# Patient Record
Sex: Male | Born: 1980
Health system: Southern US, Community
[De-identification: ages and names within clinical notes are randomized; demographics above are authoritative.]

## PROBLEM LIST (undated history)

## (undated) DIAGNOSIS — I639 Cerebral infarction, unspecified: Secondary | ICD-10-CM

## (undated) DIAGNOSIS — K219 Gastro-esophageal reflux disease without esophagitis: Secondary | ICD-10-CM

## (undated) DIAGNOSIS — I10 Essential (primary) hypertension: Secondary | ICD-10-CM

## (undated) DIAGNOSIS — F32A Depression, unspecified: Secondary | ICD-10-CM

## (undated) DIAGNOSIS — F1010 Alcohol abuse, uncomplicated: Secondary | ICD-10-CM

## (undated) HISTORY — DX: Depression, unspecified: F32.A

## (undated) HISTORY — DX: Gastro-esophageal reflux disease without esophagitis: K21.9

## (undated) HISTORY — DX: Cerebral infarction, unspecified: I63.9

## (undated) HISTORY — PX: HAND TENDON SURGERY: SHX663

## (undated) HISTORY — PX: WRIST SURGERY: SHX841

---

## 2001-08-21 ENCOUNTER — Emergency Department (HOSPITAL_COMMUNITY): Admission: EM | Admit: 2001-08-21 | Discharge: 2001-08-21 | Payer: Self-pay | Admitting: Emergency Medicine

## 2001-08-25 ENCOUNTER — Emergency Department (HOSPITAL_COMMUNITY): Admission: EM | Admit: 2001-08-25 | Discharge: 2001-08-25 | Payer: Self-pay | Admitting: *Deleted

## 2001-09-03 ENCOUNTER — Emergency Department (HOSPITAL_COMMUNITY): Admission: EM | Admit: 2001-09-03 | Discharge: 2001-09-03 | Payer: Self-pay

## 2001-09-07 ENCOUNTER — Emergency Department (HOSPITAL_COMMUNITY): Admission: EM | Admit: 2001-09-07 | Discharge: 2001-09-07 | Payer: Self-pay | Admitting: Emergency Medicine

## 2002-11-02 ENCOUNTER — Emergency Department (HOSPITAL_COMMUNITY): Admission: EM | Admit: 2002-11-02 | Discharge: 2002-11-02 | Payer: Self-pay | Admitting: Emergency Medicine

## 2008-07-03 ENCOUNTER — Emergency Department (HOSPITAL_COMMUNITY): Admission: EM | Admit: 2008-07-03 | Discharge: 2008-07-03 | Payer: Self-pay | Admitting: Emergency Medicine

## 2009-03-10 ENCOUNTER — Ambulatory Visit: Payer: Self-pay | Admitting: Family Medicine

## 2009-03-10 DIAGNOSIS — K047 Periapical abscess without sinus: Secondary | ICD-10-CM | POA: Insufficient documentation

## 2009-03-11 ENCOUNTER — Telehealth: Payer: Self-pay | Admitting: Family Medicine

## 2009-03-24 ENCOUNTER — Emergency Department (HOSPITAL_COMMUNITY): Admission: EM | Admit: 2009-03-24 | Discharge: 2009-03-25 | Payer: Self-pay | Admitting: Emergency Medicine

## 2010-11-04 LAB — DIFFERENTIAL
Basophils Absolute: 0.1 10*3/uL (ref 0.0–0.1)
Basophils Relative: 1 % (ref 0–1)
Eosinophils Absolute: 0.1 10*3/uL (ref 0.0–0.7)
Eosinophils Relative: 2 % (ref 0–5)
Lymphocytes Relative: 50 % — ABNORMAL HIGH (ref 12–46)
Lymphs Abs: 2.9 10*3/uL (ref 0.7–4.0)
Monocytes Absolute: 0.5 10*3/uL (ref 0.1–1.0)
Monocytes Relative: 9 % (ref 3–12)
Neutro Abs: 2.2 10*3/uL (ref 1.7–7.7)
Neutrophils Relative %: 39 % — ABNORMAL LOW (ref 43–77)

## 2010-11-04 LAB — BASIC METABOLIC PANEL
BUN: 7 mg/dL (ref 6–23)
CO2: 27 mEq/L (ref 19–32)
Calcium: 9 mg/dL (ref 8.4–10.5)
Chloride: 100 mEq/L (ref 96–112)
Creatinine, Ser: 0.87 mg/dL (ref 0.4–1.5)
GFR calc Af Amer: >60 mL/min (ref >60)
GFR calc non Af Amer: >60 mL/min (ref >60)
Glucose, Bld: 95 mg/dL (ref 70–99)
Potassium: 3.5 mEq/L (ref 3.5–5.1)
Sodium: 138 mEq/L (ref 135–145)

## 2010-11-04 LAB — CBC
HCT: 47.1 % (ref 39.0–52.0)
Hemoglobin: 16.5 g/dL (ref 13.0–17.0)
MCHC: 35 g/dL (ref 30.0–36.0)
MCV: 98 fL (ref 78.0–100.0)
Platelets: 365 10*3/uL (ref 150–400)
RBC: 4.81 MIL/uL (ref 4.22–5.81)
RDW: 12.4 % (ref 11.5–15.5)
WBC: 5.7 10*3/uL (ref 4.0–10.5)

## 2010-11-04 LAB — ETHANOL: Alcohol, Ethyl (B): 395 mg/dL — ABNORMAL HIGH (ref 0–10)

## 2013-08-24 ENCOUNTER — Encounter (HOSPITAL_BASED_OUTPATIENT_CLINIC_OR_DEPARTMENT_OTHER): Payer: Self-pay | Admitting: Emergency Medicine

## 2013-08-24 ENCOUNTER — Emergency Department (HOSPITAL_BASED_OUTPATIENT_CLINIC_OR_DEPARTMENT_OTHER)
Admission: EM | Admit: 2013-08-24 | Discharge: 2013-08-24 | Disposition: A | Discharge status: Home / Self Care | Payer: Self-pay | Attending: Emergency Medicine | Admitting: Emergency Medicine

## 2013-08-24 DIAGNOSIS — Z79899 Other long term (current) drug therapy: Secondary | ICD-10-CM | POA: Insufficient documentation

## 2013-08-24 DIAGNOSIS — H00019 Hordeolum externum unspecified eye, unspecified eyelid: Secondary | ICD-10-CM | POA: Insufficient documentation

## 2013-08-24 DIAGNOSIS — F17200 Nicotine dependence, unspecified, uncomplicated: Secondary | ICD-10-CM | POA: Insufficient documentation

## 2013-08-24 MED ORDER — TOBRAMYCIN 0.3 % OP SOLN: 2.0000 [drp] | OPHTHALMIC | Status: DC

## 2013-08-24 NOTE — Discharge Instructions (Signed)
Sty A sty (hordeolum) is an infection of a gland in the eyelid located at the base of the eyelash. A sty may develop a white or yellow head of pus. It can be puffy (swollen). Usually, the sty will burst and pus will come out on its own. They do not leave lumps in the eyelid once they drain. A sty is often confused with another form of cyst of the eyelid called a chalazion. Chalazions occur within the eyelid and not on the edge where the bases of the eyelashes are. They often are red, sore and then form firm lumps in the eyelid. CAUSES   Germs (bacteria).  Lasting (chronic) eyelid inflammation. SYMPTOMS   Tenderness, redness and swelling along the edge of the eyelid at the base of the eyelashes.  Sometimes, there is a white or yellow head of pus. It may or may not drain. DIAGNOSIS  An ophthalmologist will be able to distinguish between a sty and a chalazion and treat the condition appropriately.  TREATMENT   Styes are typically treated with warm packs (compresses) until drainage occurs.  In rare cases, medicines that kill germs (antibiotics) may be prescribed. These antibiotics may be in the form of drops, cream or pills.  If a hard lump has formed, it is generally necessary to do a small incision and remove the hardened contents of the cyst in a minor surgical procedure done in the office.  In suspicious cases, your caregiver may send the contents of the cyst to the lab to be certain that it is not a rare, but dangerous form of cancer of the glands of the eyelid. HOME CARE INSTRUCTIONS   Wash your hands often and dry them with a clean towel. Avoid touching your eyelid. This may spread the infection to other parts of the eye.  Apply heat to your eyelid for 10 to 20 minutes, several times a day, to ease pain and help to heal it faster.  Do not squeeze the sty. Allow it to drain on its own. Wash your eyelid carefully 3 to 4 times per day to remove any pus. SEEK IMMEDIATE MEDICAL CARE IF:    Your eye becomes painful or puffy (swollen).  Your vision changes.  Your sty does not drain by itself within 3 days.  Your sty comes back within a short period of time, even with treatment.  You have redness (inflammation) around the eye.  You have a fever. Document Released: 04/25/2005 Document Revised: 10/08/2011 Document Reviewed: 12/28/2008 ExitCare Patient Information 2014 [employer].  

## 2013-08-24 NOTE — ED Notes (Signed)
Pt c/o right eye swelling every am x 2 days, pt is from [facility]

## 2013-08-24 NOTE — ED Provider Notes (Signed)
CSN: [id number]     Arrival date & time 08/24/13  1445 History   First MD Initiated Contact with Patient 08/24/13 1705     Chief Complaint  Patient presents with  . Eye Problem   (Consider location/radiation/quality/duration/timing/severity/associated sxs/prior Treatment) Patient is a 33 y.o. male presenting with eye problem. The history is provided by the patient. No language interpreter was used.  Eye Problem Location:  R eye Severity:  Mild Onset quality:  Gradual Duration:  2 days Timing:  Constant Progression:  Worsening Chronicity:  New Relieved by:  Nothing Worsened by:  Nothing tried Ineffective treatments:  None tried Associated symptoms: redness   Associated symptoms: no blurred vision     History reviewed. No pertinent past medical history. History reviewed. No pertinent past surgical history. History reviewed. No pertinent family history. History  Substance Use Topics  . Smoking status: Current Every Day Smoker -- 1.00 packs/day    Types: Cigarettes  . Smokeless tobacco: Not on file  . Alcohol Use: Yes     Comment: [facility]    Review of Systems  Eyes: Positive for redness. Negative for blurred vision.  All other systems reviewed and are negative.    Allergies  Review of patient's allergies indicates no known allergies.  Home Medications   Current Outpatient Rx  Name  Route  Sig  Dispense  Refill  . pindolol (VISKEN) 5 MG tablet   Oral   Take 5 mg by mouth 2 (two) times daily.         . QUEtiapine (SEROQUEL XR) 300 MG 24 hr tablet   Oral   Take 300 mg by mouth at bedtime.          BP 131/71  Pulse 86  Temp(Src) 98.1 F (36.7 C) (Oral)  Resp 16  Ht 5\' 11"  (1.803 m)  Wt 201 lb (91.173 kg)  BMI 28.05 kg/m2  SpO2 98% Physical Exam  Nursing note and vitals reviewed. Constitutional: He appears well-developed and well-nourished.  HENT:  Head: Normocephalic.  Right Ear: External ear normal.  Left Ear: External ear normal.   Mouth/Throat: Oropharynx is clear and moist.  Eyes: Conjunctivae are normal. Pupils are equal, round, and reactive to light.  Stye right upper inner eyelid  Neck: Normal range of motion. Neck supple.  Cardiovascular: Normal rate.   Pulmonary/Chest: Effort normal.  Musculoskeletal: Normal range of motion.  Neurological: He is alert.  Skin: Skin is warm.    ED Course  Procedures (including critical care time) Labs Review Labs Reviewed - No data to display Imaging Review No results found.  EKG Interpretation   None       MDM   1. Stye    Warm compresses, tobrex    Elson AreasLeslie K Sofia, PA-C 08/24/13 1727

## 2013-08-24 NOTE — ED Provider Notes (Signed)
Medical screening examination/treatment/procedure(s) were performed by non-physician practitioner and as supervising physician I was immediately available for consultation/collaboration.  EKG Interpretation   None         Tin Engram, MD 08/24/13 2322 

## 2013-09-06 ENCOUNTER — Encounter (HOSPITAL_BASED_OUTPATIENT_CLINIC_OR_DEPARTMENT_OTHER): Payer: Self-pay | Admitting: Emergency Medicine

## 2013-09-06 ENCOUNTER — Emergency Department (HOSPITAL_BASED_OUTPATIENT_CLINIC_OR_DEPARTMENT_OTHER)
Admission: EM | Admit: 2013-09-06 | Discharge: 2013-09-06 | Disposition: A | Discharge status: Home / Self Care | Payer: Self-pay | Attending: Emergency Medicine | Admitting: Emergency Medicine

## 2013-09-06 DIAGNOSIS — A088 Other specified intestinal infections: Secondary | ICD-10-CM | POA: Insufficient documentation

## 2013-09-06 DIAGNOSIS — Z79899 Other long term (current) drug therapy: Secondary | ICD-10-CM | POA: Insufficient documentation

## 2013-09-06 DIAGNOSIS — F17200 Nicotine dependence, unspecified, uncomplicated: Secondary | ICD-10-CM | POA: Insufficient documentation

## 2013-09-06 DIAGNOSIS — Z792 Long term (current) use of antibiotics: Secondary | ICD-10-CM | POA: Insufficient documentation

## 2013-09-06 DIAGNOSIS — A084 Viral intestinal infection, unspecified: Secondary | ICD-10-CM

## 2013-09-06 LAB — BASIC METABOLIC PANEL
BUN: 17 mg/dL (ref 6–23)
CHLORIDE: 99 meq/L (ref 96–112)
CO2: 27 mEq/L (ref 19–32)
Calcium: 9.5 mg/dL (ref 8.4–10.5)
Creatinine, Ser: 0.9 mg/dL (ref 0.50–1.35)
GFR calc non Af Amer: >90 mL/min (ref >90)
Glucose, Bld: 118 mg/dL — ABNORMAL HIGH (ref 70–99)
POTASSIUM: 4.6 meq/L (ref 3.7–5.3)
Sodium: 140 mEq/L (ref 137–147)

## 2013-09-06 LAB — CBC WITH DIFFERENTIAL/PLATELET
Basophils Absolute: 0 10*3/uL (ref 0.0–0.1)
Basophils Relative: 0 % (ref 0–1)
Eosinophils Absolute: 0.4 10*3/uL (ref 0.0–0.7)
Eosinophils Relative: 4 % (ref 0–5)
HCT: 43 % (ref 39.0–52.0)
HEMOGLOBIN: 14.9 g/dL (ref 13.0–17.0)
LYMPHS PCT: 8 % — AB (ref 12–46)
Lymphs Abs: 1 10*3/uL (ref 0.7–4.0)
MCH: 32.7 pg (ref 26.0–34.0)
MCHC: 34.7 g/dL (ref 30.0–36.0)
MCV: 94.3 fL (ref 78.0–100.0)
Monocytes Absolute: 0.9 10*3/uL (ref 0.1–1.0)
Monocytes Relative: 7 % (ref 3–12)
NEUTROS ABS: 9.7 10*3/uL — AB (ref 1.7–7.7)
Neutrophils Relative %: 81 % — ABNORMAL HIGH (ref 43–77)
Platelets: 301 10*3/uL (ref 150–400)
RBC: 4.56 MIL/uL (ref 4.22–5.81)
RDW: 12.2 % (ref 11.5–15.5)
WBC: 12.1 10*3/uL — ABNORMAL HIGH (ref 4.0–10.5)

## 2013-09-06 MED ORDER — SODIUM CHLORIDE 0.9 % IV SOLN
1000.0000 mL | Freq: Once | INTRAVENOUS | Status: AC
  Administered 2013-09-06: 1000 mL via INTRAVENOUS

## 2013-09-06 MED ORDER — ONDANSETRON HCL 4 MG/2ML IJ SOLN
INTRAMUSCULAR | Status: AC
  Administered 2013-09-06: 4 mg via INTRAVENOUS
  Filled 2013-09-06: qty 2

## 2013-09-06 MED ORDER — DIPHENOXYLATE-ATROPINE 2.5-0.025 MG PO TABS: 1.0000 | ORAL_TABLET | Freq: Four times a day (QID) | ORAL | Status: DC | PRN

## 2013-09-06 MED ORDER — ONDANSETRON HCL 4 MG/2ML IJ SOLN
4.0000 mg | Freq: Once | INTRAMUSCULAR | Status: AC
  Administered 2013-09-06: 4 mg via INTRAVENOUS

## 2013-09-06 MED ORDER — DIPHENOXYLATE-ATROPINE 2.5-0.025 MG PO TABS
2.0000 | ORAL_TABLET | Freq: Once | ORAL | Status: AC
  Administered 2013-09-06: 2 via ORAL
  Filled 2013-09-06: qty 2

## 2013-09-06 NOTE — Discharge Instructions (Signed)
Viral Gastroenteritis °Viral gastroenteritis is also called stomach flu. This illness is caused by a certain type of germ (virus). It can cause sudden watery poop (diarrhea) and throwing up (vomiting). This can cause you to lose body fluids (dehydration). This illness usually lasts for 3 to 8 days. It usually goes away on its own. °HOME CARE  °· Drink enough fluids to keep your pee (urine) clear or pale yellow. Drink small amounts of fluids often. °· Ask your doctor how to replace body fluid losses (rehydration). °· Avoid: °· Foods high in sugar. °· Alcohol. °· Bubbly (carbonated) drinks. °· Tobacco. °· Juice. °· Caffeine drinks. °· Very hot or cold fluids. °· Fatty, greasy foods. °· Eating too much at one time. °· Dairy products until 24 to 48 hours after your watery poop stops. °· You may eat foods with active cultures (probiotics). They can be found in some yogurts and supplements. °· Wash your hands well to avoid spreading the illness. °· Only take medicines as told by your doctor. Do not give aspirin to children. Do not take medicines for watery poop (antidiarrheals). °· Ask your doctor if you should keep taking your regular medicines. °· Keep all doctor visits as told. °GET HELP RIGHT AWAY IF:  °· You cannot keep fluids down. °· You do not pee at least once every 6 to 8 hours. °· You are short of breath. °· You see blood in your poop or throw up. This may look like coffee grounds. °· You have belly (abdominal) pain that gets worse or is just in one small spot (localized). °· You keep throwing up or having watery poop. °· You have a fever. °· The patient is a child younger than 3 months, and he or she has a fever. °· The patient is a child older than 3 months, and he or she has a fever and problems that do not go away. °· The patient is a child older than 3 months, and he or she has a fever and problems that suddenly get worse. °· The patient is a baby, and he or she has no tears when crying. °MAKE SURE YOU:    °· Understand these instructions. °· Will watch your condition. °· Will get help right away if you are not doing well or get worse. °Document Released: 01/02/2008 Document Revised: 10/08/2011 Document Reviewed: 05/02/2011 °Exit[employer]® Patient Information ©2014 [employer]. ° °

## 2013-09-06 NOTE — ED Provider Notes (Signed)
CSN: [id number]     Arrival date & time 09/06/13  1611 History  This chart was scribed for Nelia Shiobert L Lashena Signer, MD by Donne Anonayla Curran, ED Scribe. This patient was seen in room MH08/MH08 and the patient's care was started at 1752.   First MD Initiated Contact with Patient 09/06/13 1752     Chief Complaint  Patient presents with  . Diarrhea    The history is provided by the patient. No language interpreter was used.   HPI Comments: Tommy Garcia is a 33 y.o. male who presents to the Emergency Department complaining of 8 hours of "explosive" diarrhea. 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 He denies nausea, vomiting, fever, hematochezia or any other symptoms. He is here with 2 other individuals with similar symptoms. They all eat at the same location. He denies recent antibiotic use.    History reviewed. No pertinent past medical history. History reviewed. No pertinent past surgical history. No family history on file. History  Substance Use Topics  . Smoking status: Current Every Day Smoker -- 1.00 packs/day    Types: Cigarettes  . Smokeless tobacco: Not on file  . Alcohol Use: Yes     Comment: [facility]    Review of Systems  Constitutional: Negative for fever.  Gastrointestinal: Positive for diarrhea. Negative for nausea and vomiting.  All other systems reviewed and are negative.    Allergies  Review of patient's allergies indicates no known allergies.  Home Medications   Current Outpatient Rx  Name  Route  Sig  Dispense  Refill  . diphenoxylate-atropine (LOMOTIL) 2.5-0.025 MG per tablet   Oral   Take 1 tablet by mouth 4 (four) times daily as needed for diarrhea or loose stools.   15 tablet   0   . pindolol (VISKEN) 5 MG tablet   Oral   Take 5 mg by mouth 2 (two) times daily.         . QUEtiapine (SEROQUEL XR) 300 MG 24 hr tablet   Oral   Take 300 mg by mouth at bedtime.         Marland Kitchen. tobramycin (TOBREX) 0.3 % ophthalmic solution   Right  Eye   Place 2 drops into the right eye every 4 (four) hours.   5 mL   0    BP 142/78  Pulse 78  Temp(Src) 99.1 F (37.3 C) (Oral)  Resp 18  Ht 5\' 11"  (1.803 m)  Wt 205 lb (92.987 kg)  BMI 28.60 kg/m2  SpO2 99% Physical Exam  Nursing note and vitals reviewed. Constitutional: He is oriented to person, place, and time. He appears well-developed and well-nourished. No distress.  HENT:  Head: Normocephalic and atraumatic.  Eyes: Pupils are equal, round, and reactive to light.  Neck: Normal range of motion.  Cardiovascular: Normal rate and intact distal pulses.   Pulmonary/Chest: No respiratory distress.  Abdominal: Normal appearance. He exhibits no distension. There is no tenderness. There is no rebound and no guarding.  Musculoskeletal: Normal range of motion.  Neurological: He is alert and oriented to person, place, and time. No cranial nerve deficit.  Skin: Skin is warm and dry. No rash noted.  Psychiatric: He has a normal mood and affect. His behavior is normal.    ED Course  Procedures (including critical care time) DIAGNOSTIC STUDIES: Oxygen Saturation is 99% on RA, normal by my interpretation.    COORDINATION OF CARE: 5:57 PM Discussed treatment plan which includes 4 mg Zofran  injection, IV fluids, Lomotil, basic metabolic panel, and CBC with differential with pt at bedside and pt agreed to plan.    Labs Review Labs Reviewed  BASIC METABOLIC PANEL - Abnormal; Notable for the following:    Glucose, Bld 118 (*)    All other components within normal limits  CBC WITH DIFFERENTIAL - Abnormal; Notable for the following:    WBC 12.1 (*)    Neutrophils Relative % 81 (*)    Neutro Abs 9.7 (*)    Lymphocytes Relative 8 (*)    All other components within normal limits   Imaging Review No results found.  Medications  0.9 %  sodium chloride infusion (0 mLs Intravenous Stopped 09/06/13 1828)  ondansetron (ZOFRAN) injection 4 mg (4 mg Intravenous Given 09/06/13 1729)   diphenoxylate-atropine (LOMOTIL) 2.5-0.025 MG per tablet 2 tablet (2 tablets Oral Given 09/06/13 1831)     MDM   1. Viral gastroenteritis       I personally performed the services described in this documentation, which was scribed in my presence. The recorded information has been reviewed and considered.    Nelia Shi, MD 09/06/13 2129

## 2013-09-06 NOTE — ED Notes (Addendum)
Patient has had diarrhea all day. Nausea, no vomiting. Started this morning. Patient from [facility], not to have narcotic pain medication

## 2013-10-29 DIAGNOSIS — I10 Essential (primary) hypertension: Secondary | ICD-10-CM | POA: Insufficient documentation

## 2015-11-15 DIAGNOSIS — I10 Essential (primary) hypertension: Secondary | ICD-10-CM

## 2017-12-19 ENCOUNTER — Emergency Department (HOSPITAL_BASED_OUTPATIENT_CLINIC_OR_DEPARTMENT_OTHER)
Admission: EM | Admit: 2017-12-19 | Discharge: 2017-12-20 | Disposition: A | Discharge status: Home / Self Care | Payer: Self-pay | Attending: Emergency Medicine | Admitting: Emergency Medicine

## 2017-12-19 ENCOUNTER — Other Ambulatory Visit: Payer: Self-pay

## 2017-12-19 ENCOUNTER — Emergency Department (HOSPITAL_BASED_OUTPATIENT_CLINIC_OR_DEPARTMENT_OTHER): Payer: Self-pay

## 2017-12-19 ENCOUNTER — Encounter (HOSPITAL_BASED_OUTPATIENT_CLINIC_OR_DEPARTMENT_OTHER): Payer: Self-pay

## 2017-12-19 DIAGNOSIS — Y939 Activity, unspecified: Secondary | ICD-10-CM | POA: Insufficient documentation

## 2017-12-19 DIAGNOSIS — S3991XA Unspecified injury of abdomen, initial encounter: Secondary | ICD-10-CM | POA: Insufficient documentation

## 2017-12-19 DIAGNOSIS — Z23 Encounter for immunization: Secondary | ICD-10-CM | POA: Insufficient documentation

## 2017-12-19 DIAGNOSIS — Y999 Unspecified external cause status: Secondary | ICD-10-CM | POA: Insufficient documentation

## 2017-12-19 DIAGNOSIS — I10 Essential (primary) hypertension: Secondary | ICD-10-CM | POA: Insufficient documentation

## 2017-12-19 DIAGNOSIS — S52592A Other fractures of lower end of left radius, initial encounter for closed fracture: Secondary | ICD-10-CM | POA: Insufficient documentation

## 2017-12-19 DIAGNOSIS — F10220 Alcohol dependence with intoxication, uncomplicated: Secondary | ICD-10-CM

## 2017-12-19 DIAGNOSIS — R51 Headache: Secondary | ICD-10-CM | POA: Insufficient documentation

## 2017-12-19 DIAGNOSIS — F10120 Alcohol abuse with intoxication, uncomplicated: Secondary | ICD-10-CM | POA: Insufficient documentation

## 2017-12-19 DIAGNOSIS — Y929 Unspecified place or not applicable: Secondary | ICD-10-CM | POA: Insufficient documentation

## 2017-12-19 DIAGNOSIS — F17210 Nicotine dependence, cigarettes, uncomplicated: Secondary | ICD-10-CM | POA: Insufficient documentation

## 2017-12-19 DIAGNOSIS — S62102A Fracture of unspecified carpal bone, left wrist, initial encounter for closed fracture: Secondary | ICD-10-CM

## 2017-12-19 DIAGNOSIS — S62112A Displaced fracture of triquetrum [cuneiform] bone, left wrist, initial encounter for closed fracture: Secondary | ICD-10-CM | POA: Insufficient documentation

## 2017-12-19 HISTORY — DX: Essential (primary) hypertension: I10

## 2017-12-19 HISTORY — DX: Alcohol abuse, uncomplicated: F10.10

## 2017-12-19 LAB — URINALYSIS, ROUTINE W REFLEX MICROSCOPIC
Bilirubin Urine: NEGATIVE
GLUCOSE, UA: NEGATIVE mg/dL
KETONES UR: NEGATIVE mg/dL
Leukocytes, UA: NEGATIVE
Nitrite: NEGATIVE
PH: 6 (ref 5.0–8.0)
PROTEIN: 100 mg/dL — AB
SPECIFIC GRAVITY, URINE: 1.01 (ref 1.005–1.030)

## 2017-12-19 LAB — CBC
HEMATOCRIT: 36.6 % — AB (ref 39.0–52.0)
Hemoglobin: 13.2 g/dL (ref 13.0–17.0)
MCH: 34.8 pg — ABNORMAL HIGH (ref 26.0–34.0)
MCHC: 36.1 g/dL — AB (ref 30.0–36.0)
MCV: 96.6 fL (ref 78.0–100.0)
Platelets: 236 10*3/uL (ref 150–400)
RBC: 3.79 MIL/uL — ABNORMAL LOW (ref 4.22–5.81)
RDW: 12 % (ref 11.5–15.5)
WBC: 5.4 10*3/uL (ref 4.0–10.5)

## 2017-12-19 LAB — PROTIME-INR
INR: 0.98
Prothrombin Time: 12.8 seconds (ref 11.4–15.2)

## 2017-12-19 LAB — COMPREHENSIVE METABOLIC PANEL
ALBUMIN: 3.5 g/dL (ref 3.5–5.0)
ALT: 47 U/L (ref 17–63)
AST: 56 U/L — AB (ref 15–41)
Alkaline Phosphatase: 85 U/L (ref 38–126)
Anion gap: 11 (ref 5–15)
BUN: 8 mg/dL (ref 6–20)
CO2: 23 mmol/L (ref 22–32)
Calcium: 8.1 mg/dL — ABNORMAL LOW (ref 8.9–10.3)
Chloride: 94 mmol/L — ABNORMAL LOW (ref 101–111)
Creatinine, Ser: 0.59 mg/dL — ABNORMAL LOW (ref 0.61–1.24)
GFR calc Af Amer: >60 mL/min (ref >60)
Glucose, Bld: 104 mg/dL — ABNORMAL HIGH (ref 65–99)
POTASSIUM: 3.5 mmol/L (ref 3.5–5.1)
SODIUM: 128 mmol/L — AB (ref 135–145)
Total Bilirubin: 0.3 mg/dL (ref 0.3–1.2)
Total Protein: 7 g/dL (ref 6.5–8.1)

## 2017-12-19 LAB — RAPID URINE DRUG SCREEN, HOSP PERFORMED
Amphetamines: NOT DETECTED
BARBITURATES: NOT DETECTED
Benzodiazepines: NOT DETECTED
Cocaine: NOT DETECTED
Opiates: NOT DETECTED
Tetrahydrocannabinol: POSITIVE — AB

## 2017-12-19 LAB — ETHANOL: Alcohol, Ethyl (B): 403 mg/dL (ref <10)

## 2017-12-19 LAB — URINALYSIS, MICROSCOPIC (REFLEX): WBC, UA: NONE SEEN WBC/hpf (ref 0–5)

## 2017-12-19 LAB — I-STAT CG4 LACTIC ACID, ED: Lactic Acid, Venous: 1.2 mmol/L (ref 0.5–1.9)

## 2017-12-19 MED ORDER — ONDANSETRON HCL 4 MG/2ML IJ SOLN
4.0000 mg | Freq: Once | INTRAMUSCULAR | Status: AC
  Administered 2017-12-19: 4 mg via INTRAVENOUS
  Filled 2017-12-19: qty 2

## 2017-12-19 MED ORDER — FENTANYL CITRATE (PF) 100 MCG/2ML IJ SOLN
50.0000 ug | Freq: Once | INTRAMUSCULAR | Status: AC
  Administered 2017-12-19: 50 ug via INTRAVENOUS
  Filled 2017-12-19: qty 2

## 2017-12-19 MED ORDER — IOPAMIDOL (ISOVUE-300) INJECTION 61%
100.0000 mL | Freq: Once | INTRAVENOUS | Status: AC | PRN
  Administered 2017-12-19: 100 mL via INTRAVENOUS

## 2017-12-19 MED ORDER — SODIUM CHLORIDE 0.9 % IV BOLUS
1000.0000 mL | Freq: Once | INTRAVENOUS | Status: AC
  Administered 2017-12-19: 1000 mL via INTRAVENOUS

## 2017-12-19 MED ORDER — TETANUS-DIPHTH-ACELL PERTUSSIS 5-2.5-18.5 LF-MCG/0.5 IM SUSP
0.5000 mL | Freq: Once | INTRAMUSCULAR | Status: AC
  Administered 2017-12-19: 0.5 mL via INTRAMUSCULAR
  Filled 2017-12-19: qty 0.5
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
## 2017-12-19 NOTE — ED Notes (Signed)
ED Provider at bedside. 

## 2017-12-19 NOTE — ED Notes (Signed)
Portable xray at bedside.

## 2017-12-19 NOTE — ED Notes (Signed)
Family at bedside. 

## 2017-12-20 ENCOUNTER — Other Ambulatory Visit: Payer: Self-pay | Admitting: Orthopedic Surgery

## 2017-12-20 ENCOUNTER — Emergency Department (HOSPITAL_BASED_OUTPATIENT_CLINIC_OR_DEPARTMENT_OTHER): Payer: Self-pay

## 2017-12-20 MED ORDER — NICOTINE 21 MG/24HR TD PT24
MEDICATED_PATCH | TRANSDERMAL | Status: AC
  Filled 2017-12-20: qty 1

## 2017-12-20 MED ORDER — HYDROCODONE-ACETAMINOPHEN 5-325 MG PO TABS: 1.0000 | ORAL_TABLET | ORAL | 0 refills | Status: DC | PRN

## 2017-12-20 MED ORDER — HYDROCODONE-ACETAMINOPHEN 5-325 MG PO TABS
1.0000 | ORAL_TABLET | Freq: Once | ORAL | Status: AC
  Administered 2017-12-20: 1 via ORAL

## 2017-12-20 MED ORDER — HYDROCODONE-ACETAMINOPHEN 5-325 MG PO TABS
ORAL_TABLET | ORAL | Status: AC
  Filled 2017-12-20: qty 1

## 2017-12-20 NOTE — ED Notes (Signed)
Family at bedside. 

## 2017-12-20 NOTE — Discharge Instructions (Signed)
Your work-up today showed evidence of left wrist fracture.  Please keep the splint dry and call the orthopedic hand team tomorrow to determine follow-up in the next few days.  Your other images were reassuring and in the blood work, your alcohol was elevated and your sodium was low.  Please try and stay hydrated.  We did not find other significant abnormalities.    Please keep your wrist elevated and be sure to follow-up with the hand team.  If any symptoms change or worsen, please return to the nearest emergency department.

## 2017-12-20 NOTE — ED Notes (Signed)
ED Provider at bedside. 

## 2017-12-20 NOTE — ED Notes (Signed)
Patient transported to CT with nurse and monitor 

## 2017-12-20 NOTE — ED Notes (Signed)
Patient transported to CT 

## 2017-12-24 ENCOUNTER — Encounter (HOSPITAL_BASED_OUTPATIENT_CLINIC_OR_DEPARTMENT_OTHER): Payer: Self-pay | Admitting: *Deleted

## 2017-12-26 ENCOUNTER — Ambulatory Visit (HOSPITAL_BASED_OUTPATIENT_CLINIC_OR_DEPARTMENT_OTHER)
Admission: RE | Admit: 2017-12-26 | Discharge: 2017-12-26 | Disposition: A | Discharge status: Home / Self Care | Payer: Self-pay | Source: Ambulatory Visit | Attending: Orthopedic Surgery | Admitting: Orthopedic Surgery

## 2017-12-26 ENCOUNTER — Ambulatory Visit (HOSPITAL_BASED_OUTPATIENT_CLINIC_OR_DEPARTMENT_OTHER): Payer: Self-pay | Admitting: Anesthesiology

## 2017-12-26 ENCOUNTER — Encounter (HOSPITAL_BASED_OUTPATIENT_CLINIC_OR_DEPARTMENT_OTHER): Payer: Self-pay | Admitting: Anesthesiology

## 2017-12-26 ENCOUNTER — Encounter (HOSPITAL_BASED_OUTPATIENT_CLINIC_OR_DEPARTMENT_OTHER)
Admission: RE | Disposition: A | Discharge status: Home / Self Care | Payer: Self-pay | Source: Ambulatory Visit | Attending: Orthopedic Surgery

## 2017-12-26 DIAGNOSIS — I10 Essential (primary) hypertension: Secondary | ICD-10-CM | POA: Insufficient documentation

## 2017-12-26 DIAGNOSIS — S52572A Other intraarticular fracture of lower end of left radius, initial encounter for closed fracture: Secondary | ICD-10-CM | POA: Insufficient documentation

## 2017-12-26 DIAGNOSIS — F17210 Nicotine dependence, cigarettes, uncomplicated: Secondary | ICD-10-CM | POA: Insufficient documentation

## 2017-12-26 DIAGNOSIS — Y9289 Other specified places as the place of occurrence of the external cause: Secondary | ICD-10-CM | POA: Insufficient documentation

## 2017-12-26 HISTORY — PX: OPEN REDUCTION INTERNAL FIXATION (ORIF) DISTAL RADIAL FRACTURE: SHX5989

## 2017-12-26 SURGERY — OPEN REDUCTION INTERNAL FIXATION (ORIF) DISTAL RADIUS FRACTURE
Anesthesia: General | Site: Wrist | Laterality: Left

## 2017-12-26 MED ORDER — CEFAZOLIN SODIUM-DEXTROSE 2-3 GM-%(50ML) IV SOLR
INTRAVENOUS | Status: DC | PRN
  Administered 2017-12-26: 2 g via INTRAVENOUS

## 2017-12-26 MED ORDER — MIDAZOLAM HCL 2 MG/2ML IJ SOLN
INTRAMUSCULAR | Status: AC
  Filled 2017-12-26: qty 2

## 2017-12-26 MED ORDER — LACTATED RINGERS IV SOLN
INTRAVENOUS | Status: DC | PRN
  Administered 2017-12-26 (×2): via INTRAVENOUS
  Administered 2017-12-26: 1000 mL

## 2017-12-26 MED ORDER — EPHEDRINE SULFATE 50 MG/ML IJ SOLN
INTRAMUSCULAR | Status: AC
  Filled 2017-12-26: qty 1

## 2017-12-26 MED ORDER — BUPIVACAINE-EPINEPHRINE (PF) 0.5% -1:200000 IJ SOLN
INTRAMUSCULAR | Status: DC | PRN
  Administered 2017-12-26: 30 mL via PERINEURAL

## 2017-12-26 MED ORDER — LIDOCAINE HCL (CARDIAC) PF 100 MG/5ML IV SOSY
PREFILLED_SYRINGE | INTRAVENOUS | Status: AC
  Filled 2017-12-26: qty 5

## 2017-12-26 MED ORDER — FENTANYL CITRATE (PF) 100 MCG/2ML IJ SOLN
INTRAMUSCULAR | Status: AC
  Filled 2017-12-26: qty 2

## 2017-12-26 MED ORDER — LIDOCAINE HCL (CARDIAC) PF 100 MG/5ML IV SOSY
PREFILLED_SYRINGE | INTRAVENOUS | Status: DC | PRN
  Administered 2017-12-26: 100 mg via INTRAVENOUS

## 2017-12-26 MED ORDER — HYDROMORPHONE HCL 1 MG/ML IJ SOLN: 0.2500 mg | INTRAMUSCULAR | Status: DC | PRN

## 2017-12-26 MED ORDER — PHENYLEPHRINE 40 MCG/ML (10ML) SYRINGE FOR IV PUSH (FOR BLOOD PRESSURE SUPPORT)
PREFILLED_SYRINGE | INTRAVENOUS | Status: AC
  Filled 2017-12-26: qty 10

## 2017-12-26 MED ORDER — CEFAZOLIN SODIUM-DEXTROSE 2-4 GM/100ML-% IV SOLN
INTRAVENOUS | Status: AC
Start: 2017-12-26 — End: ?
  Filled 2017-12-26: qty 100

## 2017-12-26 MED ORDER — DEXAMETHASONE SODIUM PHOSPHATE 10 MG/ML IJ SOLN
INTRAMUSCULAR | Status: AC
  Filled 2017-12-26: qty 1

## 2017-12-26 MED ORDER — CEFAZOLIN SODIUM-DEXTROSE 2-4 GM/100ML-% IV SOLN: 2.0000 g | INTRAVENOUS | Status: DC

## 2017-12-26 MED ORDER — OXYCODONE-ACETAMINOPHEN 5-325 MG PO TABS: ORAL_TABLET | ORAL | 0 refills | Status: DC

## 2017-12-26 MED ORDER — PROMETHAZINE HCL 25 MG/ML IJ SOLN: 6.2500 mg | INTRAMUSCULAR | Status: DC | PRN

## 2017-12-26 MED ORDER — MEPERIDINE HCL 25 MG/ML IJ SOLN: 6.2500 mg | INTRAMUSCULAR | Status: DC | PRN

## 2017-12-26 MED ORDER — SODIUM CHLORIDE 0.9 % IJ SOLN
INTRAMUSCULAR | Status: AC
Start: 2017-12-26 — End: ?
  Filled 2017-12-26: qty 10

## 2017-12-26 MED ORDER — LACTATED RINGERS IV SOLN: INTRAVENOUS | Status: DC

## 2017-12-26 MED ORDER — CHLORHEXIDINE GLUCONATE 4 % EX LIQD: 60.0000 mL | Freq: Once | CUTANEOUS | Status: DC

## 2017-12-26 MED ORDER — SCOPOLAMINE 1 MG/3DAYS TD PT72: 1.0000 | MEDICATED_PATCH | Freq: Once | TRANSDERMAL | Status: DC | PRN

## 2017-12-26 MED ORDER — PROPOFOL 10 MG/ML IV BOLUS
INTRAVENOUS | Status: AC
  Filled 2017-12-26: qty 20

## 2017-12-26 MED ORDER — SUCCINYLCHOLINE CHLORIDE 200 MG/10ML IV SOSY
PREFILLED_SYRINGE | INTRAVENOUS | Status: AC
  Filled 2017-12-26: qty 10

## 2017-12-26 MED ORDER — ONDANSETRON HCL 4 MG/2ML IJ SOLN
INTRAMUSCULAR | Status: AC
  Filled 2017-12-26: qty 2

## 2017-12-26 MED ORDER — PROPOFOL 10 MG/ML IV BOLUS
INTRAVENOUS | Status: DC | PRN
  Administered 2017-12-26: 250 mg via INTRAVENOUS

## 2017-12-26 MED ORDER — MIDAZOLAM HCL 2 MG/2ML IJ SOLN
1.0000 mg | INTRAMUSCULAR | Status: DC | PRN
  Administered 2017-12-26: 2 mg via INTRAVENOUS

## 2017-12-26 MED ORDER — FENTANYL CITRATE (PF) 100 MCG/2ML IJ SOLN
50.0000 ug | INTRAMUSCULAR | Status: DC | PRN
  Administered 2017-12-26: 100 ug via INTRAVENOUS

## 2017-12-26 MED ORDER — DEXAMETHASONE SODIUM PHOSPHATE 4 MG/ML IJ SOLN
INTRAMUSCULAR | Status: DC | PRN
  Administered 2017-12-26: 10 mg via INTRAVENOUS

## 2017-12-26 MED ORDER — EPHEDRINE SULFATE 50 MG/ML IJ SOLN
INTRAMUSCULAR | Status: DC | PRN
  Administered 2017-12-26 (×3): 10 mg via INTRAVENOUS

## 2017-12-26 MED ORDER — ONDANSETRON HCL 4 MG/2ML IJ SOLN
INTRAMUSCULAR | Status: DC | PRN
  Administered 2017-12-26: 4 mg via INTRAVENOUS

## 2017-12-26 SURGICAL SUPPLY — 85 items
ANCHOR JUGGERKNOT 1.0 3-0 NLD (Anchor) ×4 IMPLANT
ANCHOR SUT 1.45 SZ 1 SHORT (Anchor) ×4 IMPLANT
AccuMed 1.7 VA Drill ×4 IMPLANT
BANDAGE ACE 3X5.8 VEL STRL LF (GAUZE/BANDAGES/DRESSINGS) ×4 IMPLANT
BIT DRILL 1.7 VA (DRILL) ×3 IMPLANT
BIT DRILL 2.0 LNG QUCK RELEASE (BIT) ×2 IMPLANT
BIT DRILL 2.8X5 QR DISP (BIT) ×4 IMPLANT
BLADE MINI RND TIP GREEN BEAV (BLADE) ×4 IMPLANT
BLADE SURG 15 STRL LF DISP TIS (BLADE) ×4 IMPLANT
BLADE SURG 15 STRL SS (BLADE) ×4
BNDG ELASTIC 2X5.8 VLCR STR LF (GAUZE/BANDAGES/DRESSINGS) IMPLANT
BNDG ESMARK 4X9 LF (GAUZE/BANDAGES/DRESSINGS) ×4 IMPLANT
BNDG GAUZE ELAST 4 BULKY (GAUZE/BANDAGES/DRESSINGS) ×4 IMPLANT
BNDG PLASTER X FAST 3X3 WHT LF (CAST SUPPLIES) ×40 IMPLANT
CATH ROBINSON RED A/P 8FR (CATHETERS) IMPLANT
CHLORAPREP W/TINT 26ML (MISCELLANEOUS) ×4 IMPLANT
CORD BIPOLAR FORCEPS 12FT (ELECTRODE) ×4 IMPLANT
COVER BACK TABLE 60X90IN (DRAPES) ×4 IMPLANT
COVER MAYO STAND STRL (DRAPES) ×4 IMPLANT
CUFF TOURNIQUET SINGLE 18IN (TOURNIQUET CUFF) ×4 IMPLANT
CUFF TOURNIQUET SINGLE 24IN (TOURNIQUET CUFF) IMPLANT
DECANTER SPIKE VIAL GLASS SM (MISCELLANEOUS) IMPLANT
DRAPE EXTREMITY T 121X128X90 (DRAPE) ×4 IMPLANT
DRAPE OEC MINIVIEW 54X84 (DRAPES) ×4 IMPLANT
DRAPE SURG 17X23 STRL (DRAPES) ×4 IMPLANT
DRILL 2.0 LNG QUICK RELEASE (BIT) ×4
DRSG PAD ABDOMINAL 8X10 ST (GAUZE/BANDAGES/DRESSINGS) ×4 IMPLANT
GAUZE SPONGE 4X4 12PLY STRL (GAUZE/BANDAGES/DRESSINGS) ×4 IMPLANT
GAUZE XEROFORM 1X8 LF (GAUZE/BANDAGES/DRESSINGS) ×4 IMPLANT
GLOVE BIO SURGEON STRL SZ 6.5 (GLOVE) ×3 IMPLANT
GLOVE BIO SURGEON STRL SZ7.5 (GLOVE) ×4 IMPLANT
GLOVE BIO SURGEONS STRL SZ 6.5 (GLOVE) ×1
GLOVE BIOGEL PI IND STRL 7.0 (GLOVE) ×2 IMPLANT
GLOVE BIOGEL PI IND STRL 8 (GLOVE) ×4 IMPLANT
GLOVE BIOGEL PI IND STRL 8.5 (GLOVE) IMPLANT
GLOVE BIOGEL PI INDICATOR 7.0 (GLOVE) ×2
GLOVE BIOGEL PI INDICATOR 8 (GLOVE) ×4
GLOVE BIOGEL PI INDICATOR 8.5 (GLOVE)
GLOVE SURG ORTHO 8.0 STRL STRW (GLOVE) ×4 IMPLANT
GOWN STRL REUS W/ TWL LRG LVL3 (GOWN DISPOSABLE) ×2 IMPLANT
GOWN STRL REUS W/TWL LRG LVL3 (GOWN DISPOSABLE) ×2
GOWN STRL REUS W/TWL XL LVL3 (GOWN DISPOSABLE) ×8 IMPLANT
GUIDEWIRE ORTHO 0.054X6 (WIRE) ×16 IMPLANT
K-WIRE .035X4 (WIRE) IMPLANT
NDL SAFETY ECLIPSE 18X1.5 (NEEDLE) IMPLANT
NEEDLE HYPO 18GX1.5 SHARP (NEEDLE)
NEEDLE HYPO 25X1 1.5 SAFETY (NEEDLE) IMPLANT
NEEDLE KEITH (NEEDLE) IMPLANT
NS IRRIG 1000ML POUR BTL (IV SOLUTION) ×4 IMPLANT
PACK BASIN DAY SURGERY FS (CUSTOM PROCEDURE TRAY) ×4 IMPLANT
PAD CAST 3X4 CTTN HI CHSV (CAST SUPPLIES) ×2 IMPLANT
PAD CAST 4YDX4 CTTN HI CHSV (CAST SUPPLIES) IMPLANT
PADDING CAST ABS 4INX4YD NS (CAST SUPPLIES) ×2
PADDING CAST ABS COTTON 4X4 ST (CAST SUPPLIES) ×2 IMPLANT
PADDING CAST COTTON 3X4 STRL (CAST SUPPLIES) ×2
PADDING CAST COTTON 4X4 STRL (CAST SUPPLIES)
PASSER SUT SWANSON 36MM LOOP (INSTRUMENTS) IMPLANT
PLATE ACULOC 2 VDR STD LT (Plate) ×4 IMPLANT
SCREW CORT FT 18X2.3XLCK HEX (Screw) ×4 IMPLANT
SCREW CORT FT 22X2.3XLCK HEX (Screw) ×2 IMPLANT
SCREW CORTICAL LOCKING 2.3X18M (Screw) ×4 IMPLANT
SCREW CORTICAL LOCKING 2.3X20M (Screw) ×4 IMPLANT
SCREW CORTICAL LOCKING 2.3X22M (Screw) ×8 IMPLANT
SCREW FX20X2.3XSMTH LCK NS CRT (Screw) ×4 IMPLANT
SCREW FX22X2.3XLCK SMTH NS CRT (Screw) ×6 IMPLANT
SCREW HEXALOBE NON-LOCK 3.5X14 (Screw) ×8 IMPLANT
SCREW HEXALOBE NON-LOCK 3.5X16 (Screw) ×4 IMPLANT
SLEEVE SCD COMPRESS KNEE MED (MISCELLANEOUS) IMPLANT
SLING ARM FOAM STRAP XLG (SOFTGOODS) ×4 IMPLANT
SPLINT PLASTER CAST XFAST 3X15 (CAST SUPPLIES) ×2 IMPLANT
SPLINT PLASTER XTRA FASTSET 3X (CAST SUPPLIES) ×2
STOCKINETTE 4X48 STRL (DRAPES) ×4 IMPLANT
SUT ETHIBOND 3-0 V-5 (SUTURE) IMPLANT
SUT ETHILON 3 0 PS 1 (SUTURE) IMPLANT
SUT ETHILON 4 0 PS 2 18 (SUTURE) ×4 IMPLANT
SUT MERSILENE 2.0 SH NDLE (SUTURE) IMPLANT
SUT MERSILENE 4 0 P 3 (SUTURE) IMPLANT
SUT SILK 4 0 PS 2 (SUTURE) IMPLANT
SUT VIC AB 0 SH 27 (SUTURE) IMPLANT
SUT VICRYL 4-0 PS2 18IN ABS (SUTURE) ×4 IMPLANT
SYR BULB 3OZ (MISCELLANEOUS) ×4 IMPLANT
SYR CONTROL 10ML LL (SYRINGE) ×4 IMPLANT
TOWEL GREEN STERILE FF (TOWEL DISPOSABLE) ×8 IMPLANT
TOWEL OR NON WOVEN STRL DISP B (DISPOSABLE) ×4 IMPLANT
UNDERPAD 30X30 (UNDERPADS AND DIAPERS) ×4 IMPLANT

## 2017-12-26 NOTE — Op Note (Signed)
I assisted Surgeon(s) and Role:    * Betha Loa, MD - Primary    Cindee Salt, MD - Assisting on the Procedure(s): OPEN REDUCTION INTERNAL FIXATION (ORIF) DISTAL RADIAL FRACTURE, on 12/26/2017.  I provided assistance on this case as follows: setup, approach, identification, debridement, reduction, stabilization and fixation of the fracture,foloowed by closure and application of the dressings and splint. Electronically signed by: Nicki Reaper, MD Date: 12/26/2017 Time: 2:47 PM
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
## 2017-12-26 NOTE — Anesthesia Procedure Notes (Signed)
Procedure Name: LMA Insertion Date/Time: 12/26/2017 1:00 PM Performed by: Ronnette Hila, CRNA Pre-anesthesia Checklist: Patient identified, Emergency Drugs available, Suction available and Patient being monitored Patient Re-evaluated:Patient Re-evaluated prior to induction Oxygen Delivery Method: Circle system utilized Preoxygenation: Pre-oxygenation with 100% oxygen Induction Type: IV induction Ventilation: Mask ventilation without difficulty LMA: LMA inserted LMA Size: 4.0 Number of attempts: 1 Airway Equipment and Method: Bite block Placement Confirmation: positive ETCO2 Tube secured with: Tape Dental Injury: Teeth and Oropharynx as per pre-operative assessment

## 2017-12-26 NOTE — Discharge Instructions (Addendum)
Post Anesthesia Home Care Instructions  Activity: Get plenty of rest for the remainder of the day. A responsible individual must stay with you for 24 hours following the procedure.  For the next 24 hours, DO NOT: -Drive a car -Operate machinery -Drink alcoholic beverages -Take any medication unless instructed by your physician -Make any legal decisions or sign important papers.  Meals: Start with liquid foods such as gelatin or soup. Progress to regular foods as tolerated. Avoid greasy, spicy, heavy foods. If nausea and/or vomiting occur, drink only clear liquids until the nausea and/or vomiting subsides. Call your physician if vomiting continues.  Special Instructions/Symptoms: Your throat may feel dry or sore from the anesthesia or the breathing tube placed in your throat during surgery. If this causes discomfort, gargle with warm salt water. The discomfort should disappear within 24 hours.  If you had a scopolamine patch placed behind your ear for the management of post- operative nausea and/or vomiting:  1. The medication in the patch is effective for 72 hours, after which it should be removed.  Wrap patch in a tissue and discard in the trash. Wash hands thoroughly with soap and water. 2. You may remove the patch earlier than 72 hours if you experience unpleasant side effects which may include dry mouth, dizziness or visual disturbances. 3. Avoid touching the patch. Wash your hands with soap and water after contact with the patch.      Regional Anesthesia Blocks  1. Numbness or the inability to move the "blocked" extremity may last from 3-48 hours after placement. The length of time depends on the medication injected and your individual response to the medication. If the numbness is not going away after 48 hours, call your surgeon.  2. The extremity that is blocked will need to be protected until the numbness is gone and the  Strength has returned. Because you cannot feel it, you  will need to take extra care to avoid injury. Because it may be weak, you may have difficulty moving it or using it. You may not know what position it is in without looking at it while the block is in effect.  3. For blocks in the legs and feet, returning to weight bearing and walking needs to be done carefully. You will need to wait until the numbness is entirely gone and the strength has returned. You should be able to move your leg and foot normally before you try and bear weight or walk. You will need someone to be with you when you first try to ensure you do not fall and possibly risk injury.  4. Bruising and tenderness at the needle site are common side effects and will resolve in a few days.  5. Persistent numbness or new problems with movement should be communicated to the surgeon or the [facility] ([phone])/ [facility] ([phone]).    [facility] Center Instructions Hand Surgery  Wound Care: Keep your hand elevated above the level of your heart.  Do not allow it to dangle by your side.  Keep the dressing dry and do not remove it unless your doctor advises you to do so.  He will usually change it at the time of your post-op visit.  Moving your fingers is advised to stimulate circulation but will depend on the site of your surgery.  If you have a splint applied, your doctor will advise you regarding movement.  Activity: Do not drive or operate machinery today.  Rest today and   then you may return to your normal activity and work as indicated by your physician.  Diet:  Drink liquids today or eat a light diet.  You may resume a regular diet tomorrow.    General expectations: Pain for two to three days. Fingers may become slightly swollen.  Call your doctor if any of the following occur: Severe pain not relieved by pain medication. Elevated temperature. Dressing soaked with blood. Inability to move fingers. White or bluish color to fingers.  
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
## 2017-12-26 NOTE — Anesthesia Procedure Notes (Signed)
Anesthesia Regional Block: Axillary brachial plexus block   Pre-Anesthetic Checklist: ,, timeout performed, Correct Patient, Correct Site, Correct Laterality, Correct Procedure, Correct Position, site marked, Risks and benefits discussed,  Surgical consent,  Pre-op evaluation,  At surgeon's request and post-op pain management  Laterality: Left  Prep: chloraprep       Needles:  Injection technique: Single-shot  Needle Type: Echogenic Stimulator Needle     Needle Length: 5cm  Needle Gauge: 21   Needle insertion depth: 4 cm   Additional Needles:   Procedures:,,,, ultrasound used (permanent image in chart),,,,  Narrative:  Start time: 12/26/2017 12:35 PM End time: 12/26/2017 12:40 PM Injection made incrementally with aspirations every 5 mL.  Performed by: Personally  Anesthesiologist: Mal Amabile, MD  Additional Notes: Timeout performed. Patient sedated. Relevant anatomy ID'd using [place]. Incremental 2-23ml injection of LA with frequent aspiration. Patient tolerated procedure well.

## 2017-12-26 NOTE — Progress Notes (Signed)
Assisted Dr. Foster with left, ultrasound guided, axillary block. Side rails up, monitors on throughout procedure. See vital signs in flow sheet. Tolerated Procedure well. 

## 2017-12-26 NOTE — Transfer of Care (Signed)
Immediate Anesthesia Transfer of Care Note  Patient: Tommy Garcia  Procedure(s) Performed: OPEN REDUCTION INTERNAL FIXATION (ORIF) DISTAL RADIAL FRACTURE, (Left Wrist)  Patient Location: PACU  Anesthesia Type:General  Level of Consciousness: sedated  Airway & Oxygen Therapy: Patient Spontanous Breathing and Patient connected to face mask oxygen  Post-op Assessment: Report given to RN and Post -op Vital signs reviewed and stable  Post vital signs: Reviewed and stable  Last Vitals:  Vitals Value Taken Time  BP 109/65 12/26/2017  2:50 PM  Temp    Pulse 85 12/26/2017  2:51 PM  Resp 15 12/26/2017  2:50 PM  SpO2 95 % 12/26/2017  2:51 PM  Vitals shown include unvalidated device data.  Last Pain:  Vitals:   12/26/17 1220  TempSrc: Oral  PainSc: 9       Patients Stated Pain Goal: 3 (12/26/17 1220)  Complications: No apparent anesthesia complications

## 2017-12-26 NOTE — Anesthesia Preprocedure Evaluation (Addendum)
Anesthesia Evaluation  Patient identified by MRN, date of birth, ID band Patient awake    Reviewed: Allergy & Precautions, NPO status , Patient's Chart, lab work & pertinent test results  Airway Mallampati: II  TM Distance: >3 FB Neck ROM: Full    Dental  (+) Poor Dentition   Pulmonary Current Smoker,    Pulmonary exam normal breath sounds clear to auscultation       Cardiovascular hypertension, Normal cardiovascular exam Rhythm:Regular Rate:Normal     Neuro/Psych PSYCHIATRIC DISORDERS negative neurological ROS     GI/Hepatic negative GI ROS, (+)     substance abuse  alcohol use,   Endo/Other  negative endocrine ROS  Renal/GU negative Renal ROS  negative genitourinary   Musculoskeletal negative musculoskeletal ROS (+) Right wrist Fx   Abdominal   Peds  Hematology negative hematology ROS (+)   Anesthesia Other Findings   Reproductive/Obstetrics                             Anesthesia Physical Anesthesia Plan  ASA: II  Anesthesia Plan:    Post-op Pain Management: GA combined w/ Regional for post-op pain   Induction: Intravenous  PONV Risk Score and Plan: 2 and Midazolam, Ondansetron, Dexamethasone and Treatment may vary due to age or medical condition  Airway Management Planned:   Additional Equipment:   Intra-op Plan:   Post-operative Plan:   Informed Consent: I have reviewed the patients History and Physical, chart, labs and discussed the procedure including the risks, benefits and alternatives for the proposed anesthesia with the patient or authorized representative who has indicated his/her understanding and acceptance.   Dental advisory given  Plan Discussed with: CRNA  Anesthesia Plan Comments:        Anesthesia Quick Evaluation

## 2017-12-27 NOTE — Anesthesia Postprocedure Evaluation (Signed)
Anesthesia Post Note  Patient: Tommy Garcia  Procedure(s) Performed: OPEN REDUCTION INTERNAL FIXATION (ORIF) DISTAL RADIAL FRACTURE, (Left Wrist)     Patient location during evaluation: PACU Anesthesia Type: General Level of consciousness: sedated and patient cooperative Pain management: pain level controlled Vital Signs Assessment: post-procedure vital signs reviewed and stable Respiratory status: spontaneous breathing Cardiovascular status: stable Anesthetic complications: no    Last Vitals:  Vitals:   12/26/17 1530 12/26/17 1554  BP: 137/88 134/83  Pulse: (!) 103 97  Resp: 12 16  Temp:  36.6 C  SpO2: 97% 95%    Last Pain:  Vitals:   12/26/17 1554  TempSrc: Oral  PainSc: 0-No pain   Pain Goal: Patients Stated Pain Goal: 3 (12/26/17 1220)               Lewie LoronJohn Denishia Citro

## 2017-12-29 ENCOUNTER — Encounter (HOSPITAL_BASED_OUTPATIENT_CLINIC_OR_DEPARTMENT_OTHER): Payer: Self-pay | Admitting: Orthopedic Surgery

## 2017-12-30 NOTE — Op Note (Signed)
Intra-operative fluoroscopic images in the AP, lateral, and oblique views were taken and evaluated by myself.  Reduction and hardware placement were confirmed.  There was no intraarticular penetration of permanent hardware.  

## 2019-05-07 IMAGING — CT CT WRIST*L* W/O CM
3 of 5 series · 13 of 27 positions shown, 14 images · non-contrast
Comparison: Radiographs 2 hours prior.

CLINICAL DATA: Wrist trauma, initial exam. Distal radius and ulna
fractures and dorsal carpal bone fracture on radiograph.

EXAM:
CT OF THE LEFT WRIST WITHOUT CONTRAST
TECHNIQUE: Multidetector CT imaging was performed according to the standard
protocol. Multiplanar CT image reconstructions were also generated.

[Series 8: sag st · sagittal · 0.29mm/px · 5 of 99 slices shown]
[im 25/99  bone]
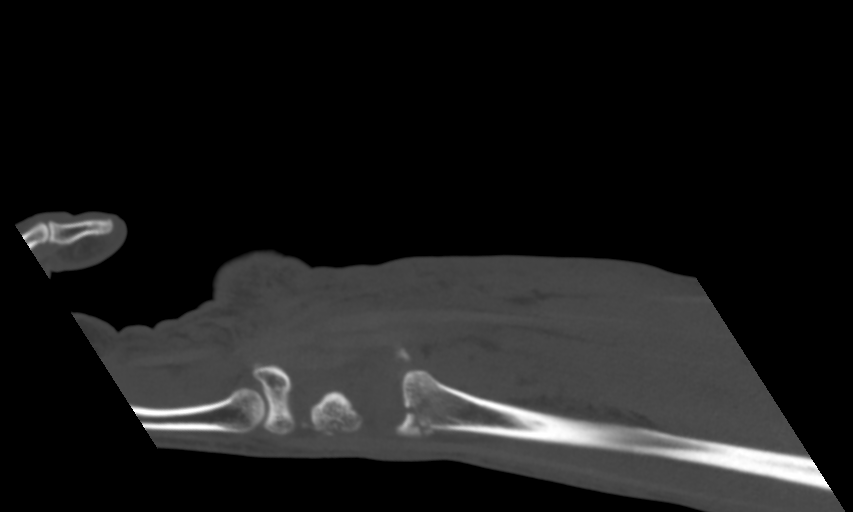
[im 37/99  bone]
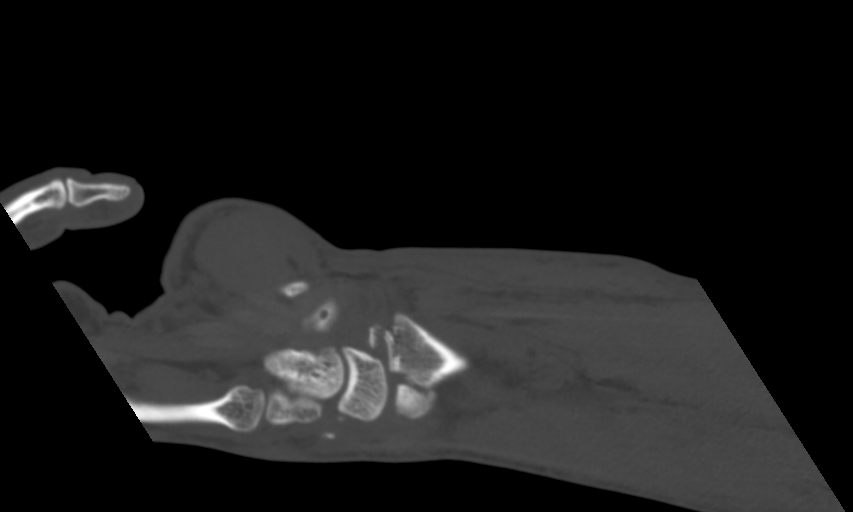
[im 50/99  bone]
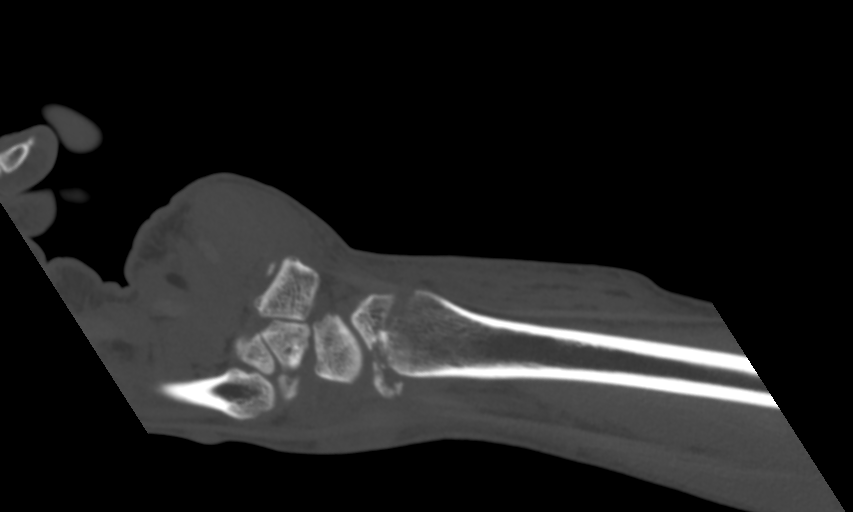
[im 62/99  bone]
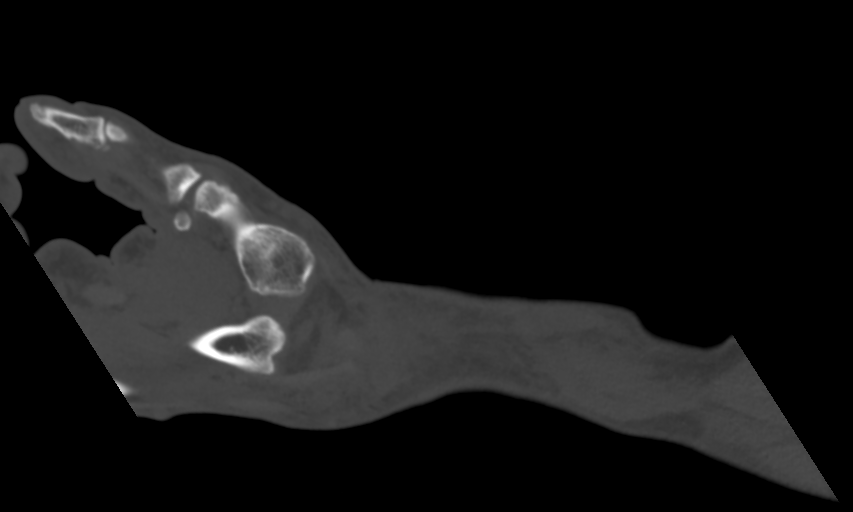
[im 74/99  bone]
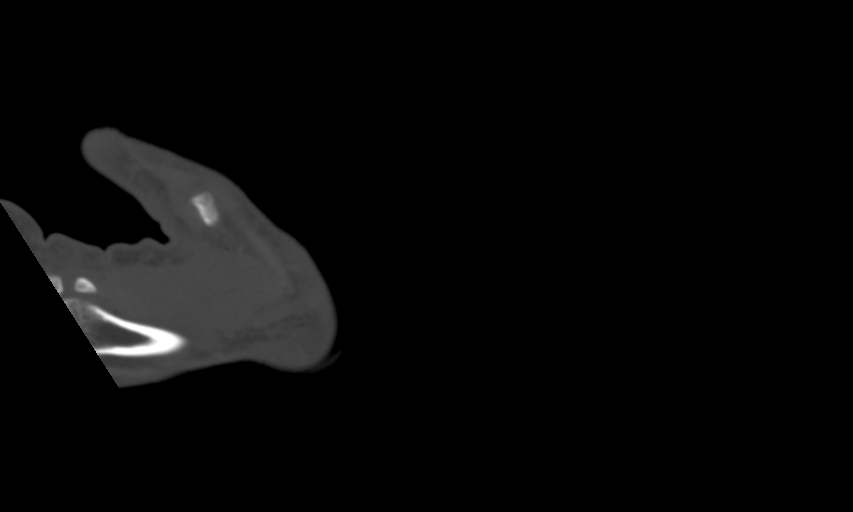

[Series 9: axial bone · axial · 0.49mm/px · z∈[-53,+19]mm · 4 of 149 slices shown, 5 images]
[im 30/149  soft-tissue]
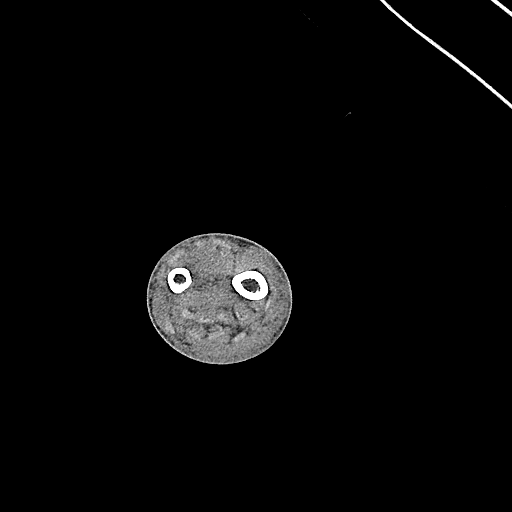
[im 30/149  bone]
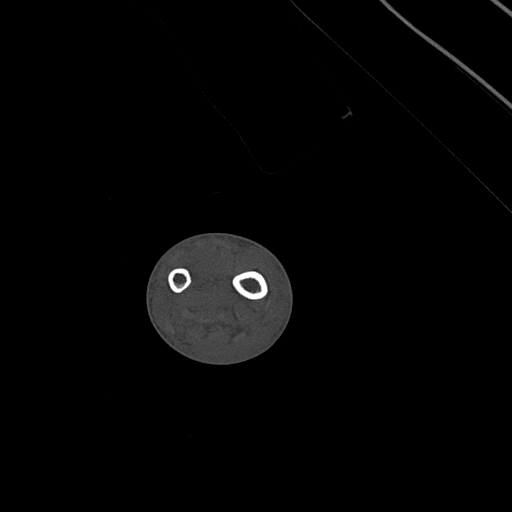
[im 60/149  bone]
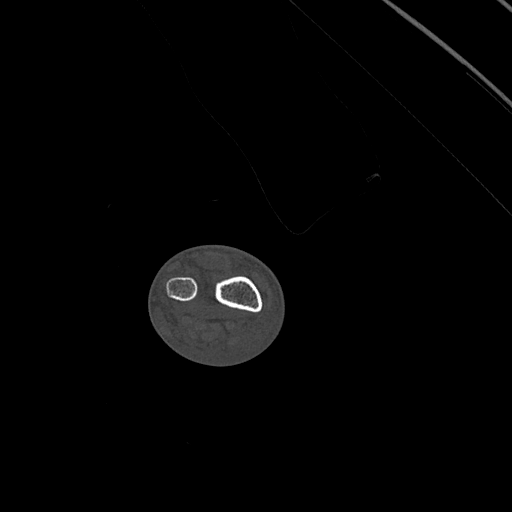
[im 89/149  bone]
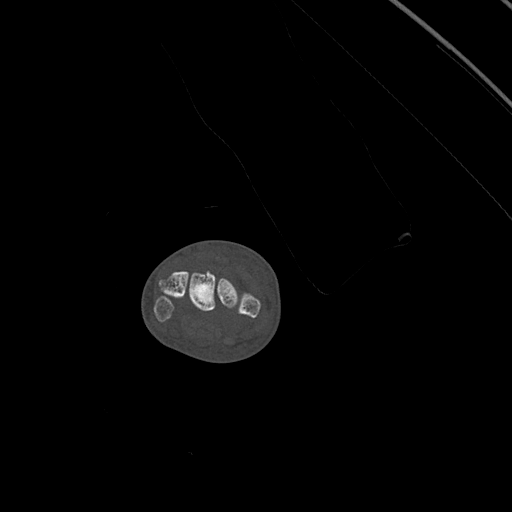
[im 119/149  bone]
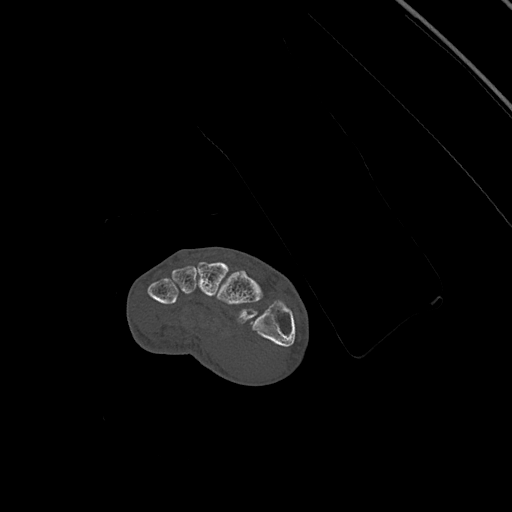

[Series 10: axial st · axial · 0.49mm/px · z∈[-62,+17]mm · 4 of 164 slices shown]
[im 33/164  bone]
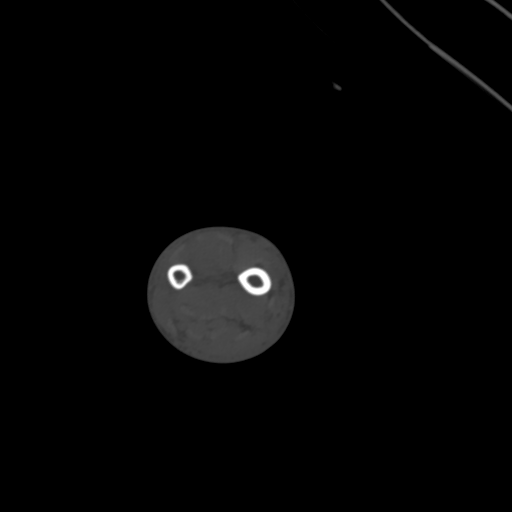
[im 66/164  bone]
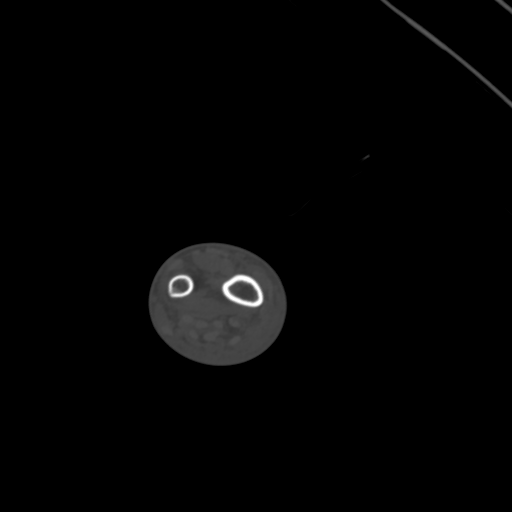
[im 98/164  bone]
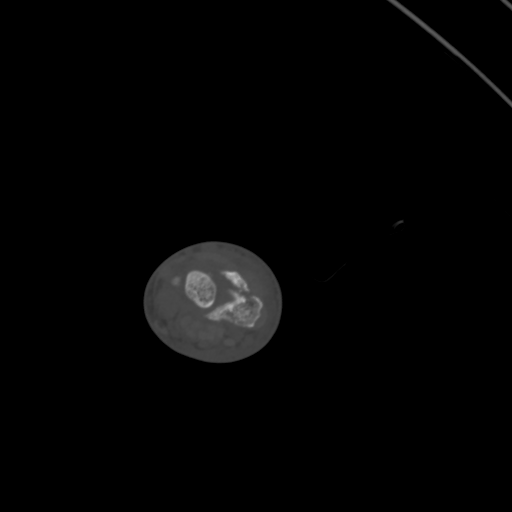
[im 131/164  bone]
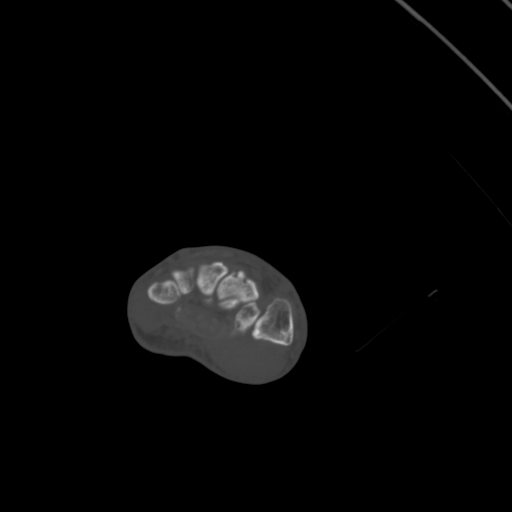

[13 of 27 positions shown; findings below may reference images not displayed]

FINDINGS: Bones/Joint/Cartilage

Comminuted and displaced intra-articular distal radius fracture,
there is articular offset of at least 3 mm. Multiple small fracture
fragments at the articular surface. Articular fracture lucencies
involve the radial styloid, radioscaphoid and radiolunate joints.
There is also involvement of the distal radioulnar joint.

Mildly displaced ulna styloid fracture.

Comminuted fracture of the dorsal and ulnar aspect of the triquetrum
with mild displacement. No fracture involvement of the
lunotriquetral joint.

The remaining carpal bones and proximal metacarpals are intact.
Possible remote thumb metacarpal fracture.

Ligaments

Suboptimally assessed by CT.

Muscles and Tendons

No large intramuscular hematoma.  No gross tendon disruption by CT.

Soft tissues

Diffuse soft tissue edema about the wrist.
IMPRESSION: 1. Comminuted mildly displaced triquetrum fracture.
2. Comminuted displaced intra-articular distal radius fracture with
3 mm articular depression and multiple intra-articular fracture
fragments.
3. Minimally displaced ulna styloid fracture.

## 2019-05-21 ENCOUNTER — Encounter (HOSPITAL_BASED_OUTPATIENT_CLINIC_OR_DEPARTMENT_OTHER): Payer: Self-pay | Admitting: *Deleted

## 2019-05-21 ENCOUNTER — Emergency Department (HOSPITAL_BASED_OUTPATIENT_CLINIC_OR_DEPARTMENT_OTHER)
Admission: EM | Admit: 2019-05-21 | Discharge: 2019-05-21 | Disposition: A | Discharge status: Home / Self Care | Payer: Self-pay | Attending: Emergency Medicine | Admitting: Emergency Medicine

## 2019-05-21 ENCOUNTER — Other Ambulatory Visit: Payer: Self-pay

## 2019-05-21 DIAGNOSIS — L089 Local infection of the skin and subcutaneous tissue, unspecified: Secondary | ICD-10-CM

## 2019-05-21 DIAGNOSIS — F17210 Nicotine dependence, cigarettes, uncomplicated: Secondary | ICD-10-CM | POA: Insufficient documentation

## 2019-05-21 DIAGNOSIS — I10 Essential (primary) hypertension: Secondary | ICD-10-CM | POA: Insufficient documentation

## 2019-05-21 DIAGNOSIS — L03213 Periorbital cellulitis: Secondary | ICD-10-CM | POA: Insufficient documentation

## 2019-05-21 DIAGNOSIS — L723 Sebaceous cyst: Secondary | ICD-10-CM

## 2019-05-21 DIAGNOSIS — H02824 Cysts of left upper eyelid: Secondary | ICD-10-CM | POA: Insufficient documentation

## 2019-05-21 MED ORDER — LIDOCAINE-EPINEPHRINE 2 %-1:100000 IJ SOLN
20.0000 mL | Freq: Once | INTRAMUSCULAR | Status: AC
  Administered 2019-05-21: 20 mL via INTRADERMAL
  Filled 2019-05-21: qty 20

## 2019-05-21 MED ORDER — AMOXICILLIN-POT CLAVULANATE 875-125 MG PO TABS: 1.0000 | ORAL_TABLET | Freq: Two times a day (BID) | ORAL | 0 refills | Status: DC

## 2019-05-21 MED ORDER — LIDOCAINE-EPINEPHRINE 1 %-1:100000 IJ SOLN
INTRAMUSCULAR | Status: AC
  Filled 2019-05-21: qty 1

## 2019-05-21 MED FILL — AMOX-CLAV 875-125 MG TABLET: 875-125 | 7 days supply | Qty: 14 | Fill #0

## 2019-05-21 NOTE — ED Notes (Signed)
ED Provider at bedside. 

## 2019-05-21 NOTE — ED Provider Notes (Signed)
Medical screening examination/treatment/procedure(s) were conducted as a shared visit with non-physician practitioner(s) and myself.  I personally evaluated the patient during the encounter.    38 year old male with a cyst above his left eye.  Clinically this is an infected sebaceous cyst.  Reports that he has had a cyst for years but over the past 2 weeks has grown in size, had some redness and been painful.   Lesion is large to the point that he is having difficulty even opening his left eye simply from mass-effect.  We will incise and drain here in the emergency room.  Discussed with patient that our goal is to at least decompress it for symptomatic improvement but that this is realistically not going to be definitive treatment.  Given the location,  I do not feel comfortable being aggressive/attempting to completely excise the cyst.  I feel that this would be most appropriate by ophthalmology/plastic surgery/oculoplastics.  We will have him do warm compresses and placed on antibiotics as well.   Virgel Manifold, MD 05/21/19 [phone]

## 2019-05-21 NOTE — Discharge Instructions (Addendum)
Get help right away if:  You have new symptoms.  Your symptoms get worse or do not get better with treatment.  You have a fever.  Your vision becomes blurry or gets worse in any way.  Your eye looks like it is sticking out or bulging out (proptosis).  You have trouble moving your eyes.  You have a severe headache.  You have neck stiffness or severe neck pain.
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
## 2019-05-21 NOTE — ED Triage Notes (Signed)
Pt c/o " cyst " above left eye x 2 weeks, hx of same x 4 years ago

## 2019-05-21 NOTE — ED Notes (Signed)
EDP applied dressing to pt eyelid.

## 2020-02-10 ENCOUNTER — Ambulatory Visit: Payer: Self-pay | Admitting: Physician Assistant

## 2020-02-17 ENCOUNTER — Other Ambulatory Visit: Payer: Self-pay

## 2020-02-17 ENCOUNTER — Ambulatory Visit: Payer: Medicaid Other | Admitting: Physician Assistant

## 2020-02-17 ENCOUNTER — Encounter: Payer: Self-pay | Admitting: Physician Assistant

## 2020-02-17 VITALS — BP 90/70 | HR 91 | Temp 98.1°F | Ht 69.5 in | Wt 159.0 lb

## 2020-02-17 DIAGNOSIS — F1010 Alcohol abuse, uncomplicated: Secondary | ICD-10-CM

## 2020-02-17 DIAGNOSIS — R7309 Other abnormal glucose: Secondary | ICD-10-CM

## 2020-02-17 DIAGNOSIS — Z131 Encounter for screening for diabetes mellitus: Secondary | ICD-10-CM

## 2020-02-17 DIAGNOSIS — Z13220 Encounter for screening for lipoid disorders: Secondary | ICD-10-CM

## 2020-02-17 DIAGNOSIS — Z7689 Persons encountering health services in other specified circumstances: Secondary | ICD-10-CM

## 2020-02-17 DIAGNOSIS — F32A Depression, unspecified: Secondary | ICD-10-CM
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
## 2020-02-23 ENCOUNTER — Inpatient Hospital Stay (HOSPITAL_COMMUNITY): Payer: Self-pay

## 2020-02-23 ENCOUNTER — Encounter (HOSPITAL_COMMUNITY): Payer: Self-pay | Admitting: *Deleted

## 2020-02-23 ENCOUNTER — Other Ambulatory Visit: Payer: Self-pay

## 2020-02-23 ENCOUNTER — Emergency Department (HOSPITAL_COMMUNITY): Payer: Self-pay

## 2020-02-23 ENCOUNTER — Inpatient Hospital Stay (HOSPITAL_COMMUNITY)
Admission: EM | Admit: 2020-02-23 | Discharge: 2020-03-10 | DRG: 064 | Disposition: A | Discharge status: Skilled Nursing Facility | Payer: Self-pay | Attending: Internal Medicine | Admitting: Internal Medicine

## 2020-02-23 DIAGNOSIS — E876 Hypokalemia: Secondary | ICD-10-CM | POA: Diagnosis not present

## 2020-02-23 DIAGNOSIS — F10239 Alcohol dependence with withdrawal, unspecified: Secondary | ICD-10-CM

## 2020-02-23 DIAGNOSIS — Z781 Physical restraint status: Secondary | ICD-10-CM

## 2020-02-23 DIAGNOSIS — F10939 Alcohol use, unspecified with withdrawal, unspecified: Secondary | ICD-10-CM

## 2020-02-23 DIAGNOSIS — E222 Syndrome of inappropriate secretion of antidiuretic hormone: Secondary | ICD-10-CM | POA: Diagnosis present

## 2020-02-23 DIAGNOSIS — F10231 Alcohol dependence with withdrawal delirium: Secondary | ICD-10-CM | POA: Diagnosis present

## 2020-02-23 DIAGNOSIS — I619 Nontraumatic intracerebral hemorrhage, unspecified: Secondary | ICD-10-CM | POA: Diagnosis present

## 2020-02-23 DIAGNOSIS — E871 Hypo-osmolality and hyponatremia: Secondary | ICD-10-CM

## 2020-02-23 DIAGNOSIS — Z6822 Body mass index (BMI) 22.0-22.9, adult: Secondary | ICD-10-CM

## 2020-02-23 DIAGNOSIS — G8194 Hemiplegia, unspecified affecting left nondominant side: Secondary | ICD-10-CM | POA: Diagnosis present

## 2020-02-23 DIAGNOSIS — R471 Dysarthria and anarthria: Secondary | ICD-10-CM | POA: Diagnosis present

## 2020-02-23 DIAGNOSIS — I612 Nontraumatic intracerebral hemorrhage in hemisphere, unspecified: Secondary | ICD-10-CM

## 2020-02-23 DIAGNOSIS — G935 Compression of brain: Secondary | ICD-10-CM | POA: Diagnosis present

## 2020-02-23 DIAGNOSIS — I10 Essential (primary) hypertension: Secondary | ICD-10-CM | POA: Diagnosis present

## 2020-02-23 DIAGNOSIS — E43 Unspecified severe protein-calorie malnutrition: Secondary | ICD-10-CM

## 2020-02-23 DIAGNOSIS — R1310 Dysphagia, unspecified: Secondary | ICD-10-CM | POA: Diagnosis present

## 2020-02-23 DIAGNOSIS — E440 Moderate protein-calorie malnutrition: Secondary | ICD-10-CM | POA: Diagnosis present

## 2020-02-23 DIAGNOSIS — G936 Cerebral edema: Secondary | ICD-10-CM | POA: Diagnosis present

## 2020-02-23 DIAGNOSIS — G92 Toxic encephalopathy: Secondary | ICD-10-CM | POA: Diagnosis present

## 2020-02-23 DIAGNOSIS — I161 Hypertensive emergency: Secondary | ICD-10-CM | POA: Diagnosis present

## 2020-02-23 DIAGNOSIS — Z560 Unemployment, unspecified: Secondary | ICD-10-CM

## 2020-02-23 DIAGNOSIS — R29810 Facial weakness: Secondary | ICD-10-CM | POA: Diagnosis present

## 2020-02-23 DIAGNOSIS — I610 Nontraumatic intracerebral hemorrhage in hemisphere, subcortical: Principal | ICD-10-CM | POA: Diagnosis present

## 2020-02-23 DIAGNOSIS — K7010 Alcoholic hepatitis without ascites: Secondary | ICD-10-CM | POA: Diagnosis present

## 2020-02-23 DIAGNOSIS — Z20822 Contact with and (suspected) exposure to covid-19: Secondary | ICD-10-CM | POA: Diagnosis present

## 2020-02-23 LAB — DIFFERENTIAL
Abs Immature Granulocytes: 0.04 10*3/uL (ref 0.00–0.07)
Basophils Absolute: 0 10*3/uL (ref 0.0–0.1)
Basophils Relative: 1 %
Eosinophils Absolute: 0 10*3/uL (ref 0.0–0.5)
Eosinophils Relative: 0 %
Immature Granulocytes: 1 %
Lymphocytes Relative: 6 %
Lymphs Abs: 0.3 10*3/uL — ABNORMAL LOW (ref 0.7–4.0)
Monocytes Absolute: 0.4 10*3/uL (ref 0.1–1.0)
Monocytes Relative: 8 %
Neutro Abs: 4.9 10*3/uL (ref 1.7–7.7)
Neutrophils Relative %: 84 %

## 2020-02-23 LAB — COMPREHENSIVE METABOLIC PANEL
ALT: 46 U/L — ABNORMAL HIGH (ref 0–44)
AST: 80 U/L — ABNORMAL HIGH (ref 15–41)
Albumin: 3.9 g/dL (ref 3.5–5.0)
Alkaline Phosphatase: 234 U/L — ABNORMAL HIGH (ref 38–126)
Anion gap: 13 (ref 5–15)
BUN: 7 mg/dL (ref 6–20)
CO2: 26 mmol/L (ref 22–32)
Calcium: 9.7 mg/dL (ref 8.9–10.3)
Chloride: 88 mmol/L — ABNORMAL LOW (ref 98–111)
Creatinine, Ser: 0.53 mg/dL — ABNORMAL LOW (ref 0.61–1.24)
GFR calc Af Amer: >60 mL/min (ref >60)
GFR calc non Af Amer: >60 mL/min (ref >60)
Glucose, Bld: 102 mg/dL — ABNORMAL HIGH (ref 70–99)
Potassium: 4.1 mmol/L (ref 3.5–5.1)
Sodium: 127 mmol/L — ABNORMAL LOW (ref 135–145)
Total Bilirubin: 1.7 mg/dL — ABNORMAL HIGH (ref 0.3–1.2)
Total Protein: 8.9 g/dL — ABNORMAL HIGH (ref 6.5–8.1)

## 2020-02-23 LAB — CK: Total CK: 282 U/L (ref 49–397)

## 2020-02-23 LAB — SARS CORONAVIRUS 2 BY RT PCR (HOSPITAL ORDER, PERFORMED IN ~~LOC~~ HOSPITAL LAB): SARS Coronavirus 2: NEGATIVE

## 2020-02-23 LAB — CBC
HCT: 38.5 % — ABNORMAL LOW (ref 39.0–52.0)
Hemoglobin: 13.2 g/dL (ref 13.0–17.0)
MCH: 32.9 pg (ref 26.0–34.0)
MCHC: 34.3 g/dL (ref 30.0–36.0)
MCV: 96 fL (ref 80.0–100.0)
Platelets: 209 10*3/uL (ref 150–400)
RBC: 4.01 MIL/uL — ABNORMAL LOW (ref 4.22–5.81)
RDW: 14.4 % (ref 11.5–15.5)
WBC: 5.8 10*3/uL (ref 4.0–10.5)
nRBC: 0 % (ref 0.0–0.2)

## 2020-02-23 LAB — PROTIME-INR
INR: 1.1 (ref 0.8–1.2)
Prothrombin Time: 13.4 seconds (ref 11.4–15.2)

## 2020-02-23 LAB — ETHANOL: Alcohol, Ethyl (B): <10 mg/dL (ref <10)

## 2020-02-23 LAB — APTT: aPTT: 38 seconds — ABNORMAL HIGH (ref 24–36)

## 2020-02-23 MED ORDER — THIAMINE HCL 100 MG/ML IJ SOLN
100.0000 mg | Freq: Every day | INTRAMUSCULAR | Status: DC
  Administered 2020-02-23: 100 mg via INTRAVENOUS
  Filled 2020-02-23: qty 2

## 2020-02-23 MED ORDER — IOHEXOL 350 MG/ML SOLN
100.0000 mL | Freq: Once | INTRAVENOUS | Status: AC | PRN
  Administered 2020-02-23: 100 mL via INTRAVENOUS

## 2020-02-23 MED ORDER — LORAZEPAM 1 MG PO TABS: 0.0000 mg | ORAL_TABLET | Freq: Two times a day (BID) | ORAL | Status: DC

## 2020-02-23 MED ORDER — LORAZEPAM 2 MG/ML IJ SOLN: 0.0000 mg | Freq: Two times a day (BID) | INTRAMUSCULAR | Status: DC

## 2020-02-23 MED ORDER — LORAZEPAM 2 MG/ML IJ SOLN
0.0000 mg | Freq: Four times a day (QID) | INTRAMUSCULAR | Status: DC
  Administered 2020-02-23 – 2020-02-24 (×3): 2 mg via INTRAVENOUS
  Filled 2020-02-23 (×3): qty 1

## 2020-02-23 MED ORDER — THIAMINE HCL 100 MG PO TABS: 100.0000 mg | ORAL_TABLET | Freq: Every day | ORAL | Status: DC

## 2020-02-23 MED ORDER — LORAZEPAM 1 MG PO TABS: 0.0000 mg | ORAL_TABLET | Freq: Four times a day (QID) | ORAL | Status: DC

## 2020-02-23 NOTE — ED Notes (Signed)
Pt father at bedside. Pt requesting beer

## 2020-02-23 NOTE — ED Notes (Addendum)
Pt urinated and passed stool in his pants. Pt changed and new sheets on bed. Pt pulled up in bed. Pt unable to follow commands, able to tell nurse name and birthday but unable to tell nurse where he is or why he is here. Pt had a large amount of saliva in mouth, suction at bedside. Will recheck patients VS after patient settles down.

## 2020-02-23 NOTE — ED Triage Notes (Signed)
Pt brought in by [facility] for c/o weakness; pt states he was sitting in his chair and he somehow ended up in the floor sitting there for an unknown amount of time; pt states he thinks it may have been around 2 hours; pt states his legs feel weak and he is unable to walk on them; pt has some slurred speech; pt reports having a headache

## 2020-02-23 NOTE — ED Provider Notes (Signed)
[facility] EMERGENCY DEPARTMENT Provider Note   CSN: [id number] Arrival date & time: 02/23/20  1847     History No chief complaint on file.   Tommy Garcia is a 39 y.o. male.  Level 5 caveat secondary to altered mental status.  Patient is brought in by EMS for generalized weakness, numbness in his legs, falls, possible stroke symptoms, and possible alcohol withdrawal.  Patient reportedly drinks heavily but has been trying to taper over the last few days because he is getting fasting blood work done tomorrow.  He said his legs have been numb and giving out on him.  Denies any headache chest pain abdominal pain. Very poor historian, appears to be in alcohol withdrawl  The history is provided by the patient and the EMS personnel.  Cerebrovascular Accident This is a new problem. The current episode started more than 2 days ago. The problem occurs constantly. The problem has not changed since onset.Pertinent negatives include no chest pain, no abdominal pain, no headaches and no shortness of breath. Nothing aggravates the symptoms. Nothing relieves the symptoms. He has tried nothing for the symptoms. The treatment provided no relief.       Past Medical History:  Diagnosis Date  . Alcohol abuse   . Hypertension     Patient Active Problem List   Diagnosis Date Noted  . Hypertension 10/29/2013  . ABSCESS, TOOTH 03/10/2009    Past Surgical History:  Procedure Laterality Date  . HAND TENDON SURGERY Right   . OPEN REDUCTION INTERNAL FIXATION (ORIF) DISTAL RADIAL FRACTURE Left 12/26/2017   Procedure: OPEN REDUCTION INTERNAL FIXATION (ORIF) DISTAL RADIAL FRACTURE,;  Surgeon: Betha Loa, MD;  Location: [facility];  Service: Orthopedics;  Laterality: Left;  . WRIST SURGERY Left around 1995       No family history on file.  Social History   Tobacco Use  . Smoking status: Former Smoker    Packs/day: 1.00    Years: 25.00    Pack years: 25.00    Types: Cigarettes     Quit date: 11/2019    Years since quitting: 0.2  . Smokeless tobacco: Never Used  Vaping Use  . Vaping Use: Never used  Substance Use Topics  . Alcohol use: Yes    Alcohol/week: 12.0 standard drinks    Types: 12 Cans of beer per week    Comment: 10-18 beers /day  . Drug use: Not Currently    Types: Marijuana    Home Medications Prior to Admission medications   Not on File    Allergies    Patient has no known allergies.  Review of Systems   Review of Systems  Constitutional: Negative for fever.  HENT: Negative for sore throat.   Eyes: Negative for visual disturbance.  Respiratory: Negative for shortness of breath.   Cardiovascular: Negative for chest pain.  Gastrointestinal: Negative for abdominal pain.  Genitourinary: Negative for dysuria.  Musculoskeletal: Negative for neck pain.  Skin: Negative for rash.  Neurological: Positive for dizziness, tremors, speech difficulty, weakness and numbness. Negative for headaches.    Physical Exam Updated Vital Signs BP (!) 129/75   Pulse 78   Temp 99.5 F (37.5 C) (Oral)   Resp (!) 31   Ht 5\' 11"  (1.803 m)   Wt 72.6 kg   SpO2 99%   BMI 22.32 kg/m   Physical Exam Vitals and nursing note reviewed.  Constitutional:      Appearance: Normal appearance. He is well-developed.  HENT:  Head: Normocephalic and atraumatic.  Eyes:     Conjunctiva/sclera: Conjunctivae normal.  Cardiovascular:     Rate and Rhythm: Normal rate and regular rhythm.     Heart sounds: No murmur heard.   Pulmonary:     Effort: Pulmonary effort is normal. No respiratory distress.     Breath sounds: Normal breath sounds.  Abdominal:     Palpations: Abdomen is soft.     Tenderness: There is no abdominal tenderness.  Musculoskeletal:        General: No deformity or signs of injury. Normal range of motion.     Cervical back: Neck supple.  Skin:    General: Skin is warm and dry.     Capillary Refill: Capillary refill takes less than 2 seconds.    Neurological:     Mental Status: He is alert.     Sensory: Sensory deficit present.     Motor: Weakness present.     Comments: Patient is awake and alert.  He appears tremulous.  Speech is a little bit difficult to understand question garbled.  He is able to raise his right arm up and right leg up.  He is unable to raise his left arm much against gravity.  Nurse that he could not raise his left leg but I was able to get him to raise it.  Inconsistent exam.  Decree sensation lower extremities.     ED Results / Procedures / Treatments   Labs (all labs ordered are listed, but only abnormal results are displayed) Labs Reviewed  APTT - Abnormal; Notable for the following components:      Result Value   aPTT 38 (*)    All other components within normal limits  CBC - Abnormal; Notable for the following components:   RBC 4.01 (*)    HCT 38.5 (*)    All other components within normal limits  DIFFERENTIAL - Abnormal; Notable for the following components:   Lymphs Abs 0.3 (*)    All other components within normal limits  COMPREHENSIVE METABOLIC PANEL - Abnormal; Notable for the following components:   Sodium 127 (*)    Chloride 88 (*)    Glucose, Bld 102 (*)    Creatinine, Ser 0.53 (*)    Total Protein 8.9 (*)    AST 80 (*)    ALT 46 (*)    Alkaline Phosphatase 234 (*)    Total Bilirubin 1.7 (*)    All other components within normal limits  AMMONIA - Abnormal; Notable for the following components:   Ammonia 40 (*)    All other components within normal limits  COMPREHENSIVE METABOLIC PANEL - Abnormal; Notable for the following components:   Sodium 130 (*)    Chloride 91 (*)    BUN 5 (*)    Total Protein 8.5 (*)    AST 73 (*)    Alkaline Phosphatase 211 (*)    Total Bilirubin 1.7 (*)    All other components within normal limits  CBC - Abnormal; Notable for the following components:   RBC 4.03 (*)    HCT 38.7 (*)    All other components within normal limits  SARS CORONAVIRUS 2  BY RT PCR (HOSPITAL ORDER, PERFORMED IN [facility])  ETHANOL  PROTIME-INR  CK  HIV ANTIBODY (ROUTINE TESTING W REFLEX)  MAGNESIUM  PHOSPHORUS  RAPID URINE DRUG SCREEN, HOSP PERFORMED  URINALYSIS, ROUTINE W REFLEX MICROSCOPIC    EKG EKG Interpretation  Date/Time:  Tuesday February 23 2020 19:05:01 EDT Ventricular Rate:  91 PR Interval:    QRS Duration: 98 QT Interval:  359 QTC Calculation: 442 R Axis:   85 Text Interpretation: Sinus rhythm Borderline prolonged PR interval Consider right atrial enlargement Consider right ventricular hypertrophy No significant change since prior 4/04 Confirmed by Meridee Score [phone]) on 02/23/2020 7:14:58 PM   Radiology CT Angio Head W or Wo Contrast  Result Date: 02/23/2020 CLINICAL DATA:  Weakness, intracranial hemorrhage EXAM: CT ANGIOGRAPHY HEAD AND NECK TECHNIQUE: Multidetector CT imaging of the head and neck was performed using the standard protocol during bolus administration of intravenous contrast. Multiplanar CT image reconstructions and MIPs were obtained to evaluate the vascular anatomy. Carotid stenosis measurements (when applicable) are obtained utilizing NASCET criteria, using the distal internal carotid diameter as the denominator. CONTRAST:  OMNIPAQUE IOHEXOL 350 MG/ML SOLN COMPARISON:  Correlation made with prior head CT FINDINGS: CTA NECK FINDINGS Aortic arch: Great vessel origins are patent. Right carotid system: Patent. No measurable stenosis at the ICA origin. Left carotid system: Patent. No measurable stenosis at the ICA origin. Vertebral arteries: Patent. Left vertebral artery is slightly dominant. Skeleton: No significant abnormality. Other neck: No mass or adenopathy. Upper chest: No apical lung mass. Mild cystic change versus centrilobular emphysema Review of the MIP images confirms the above findings CTA HEAD FINDINGS Anterior circulation: Intracranial internal carotid arteries are patent. Anterior and middle  cerebral arteries are patent. There is no contrast blush or abnormal vascularity in the region parenchymal hemorrhage. Posterior circulation: Intracranial vertebral arteries, basilar artery, and posterior cerebral arteries are patent. Venous sinuses: As permitted by contrast timing, patent. Review of the MIP images confirms the above findings IMPRESSION: No abnormal vascularity in area of hemorrhage. No hemodynamically significant stenosis. Electronically Signed   By: Guadlupe Spanish M.D.   On: 02/23/2020 21:44   CT Head Wo Contrast  Result Date: 02/24/2020 CLINICAL DATA:  Stroke follow-up. EXAM: CT HEAD WITHOUT CONTRAST TECHNIQUE: Contiguous axial images were obtained from the base of the skull through the vertex without intravenous contrast. COMPARISON:  Yesterday FINDINGS: Brain: 16 cc acute hematoma centered at the right external capsule and putamen is unchanged in size and shape. Mild vasogenic edema is also stable. Local mass effect. No entrapment or acute infarct. Vascular: Recent CTA.  No acute finding Skull: No traumatic or aggressive finding Sinuses/Orbits: Completely opacified left maxillary sinus with sclerotic wall thickening. IMPRESSION: Size stable 16 cc hematoma at the right basal ganglia. Electronically Signed   By: Marnee Spring M.D.   On: 02/24/2020 05:21   CT HEAD WO CONTRAST  Result Date: 02/23/2020 CLINICAL DATA:  Increasing weakness EXAM: CT HEAD WITHOUT CONTRAST TECHNIQUE: Contiguous axial images were obtained from the base of the skull through the vertex without intravenous contrast. COMPARISON:  12/19/2017 FINDINGS: Brain: There is a geographic area of hemorrhage identified in the region of the basal ganglia on the right measuring 4.8 x 2.1 cm in greatest AP and transverse dimensions respectively. It extends approximately 3.9 cm in craniocaudad length. Mild surrounding edema is noted. Mild atrophic changes are seen. Chronic white matter ischemic changes are noted. No other  hemorrhage is noted. No definitive intraventricular involvement is seen. Mild midline shift is noted from right to left secondary to the hemorrhage. This is approximately 4 mm in dimension. Vascular: No hyperdense vessel or unexpected calcification. Skull: Normal. Negative for fracture or focal lesion. Sinuses/Orbits: Opacification of the left maxillary antrum is noted increased from the prior exam. Orbits appear within normal  limits. Other: None IMPRESSION: New area of hemorrhage within the right basal ganglia likely related to hypertensive bleed. Mild midline shift is noted from right to left. Critical Value/emergent results were called by telephone at the time of interpretation on 02/23/2020 at 7:52 pm to Dr. Meridee Score , who verbally acknowledged these results. Electronically Signed   By: Alcide Clever M.D.   On: 02/23/2020 19:53   CT Angio Neck W and/or Wo Contrast  Result Date: 02/23/2020 CLINICAL DATA:  Weakness, intracranial hemorrhage EXAM: CT ANGIOGRAPHY HEAD AND NECK TECHNIQUE: Multidetector CT imaging of the head and neck was performed using the standard protocol during bolus administration of intravenous contrast. Multiplanar CT image reconstructions and MIPs were obtained to evaluate the vascular anatomy. Carotid stenosis measurements (when applicable) are obtained utilizing NASCET criteria, using the distal internal carotid diameter as the denominator. CONTRAST:  OMNIPAQUE IOHEXOL 350 MG/ML SOLN COMPARISON:  Correlation made with prior head CT FINDINGS: CTA NECK FINDINGS Aortic arch: Great vessel origins are patent. Right carotid system: Patent. No measurable stenosis at the ICA origin. Left carotid system: Patent. No measurable stenosis at the ICA origin. Vertebral arteries: Patent. Left vertebral artery is slightly dominant. Skeleton: No significant abnormality. Other neck: No mass or adenopathy. Upper chest: No apical lung mass. Mild cystic change versus centrilobular emphysema Review  of the MIP images confirms the above findings CTA HEAD FINDINGS Anterior circulation: Intracranial internal carotid arteries are patent. Anterior and middle cerebral arteries are patent. There is no contrast blush or abnormal vascularity in the region parenchymal hemorrhage. Posterior circulation: Intracranial vertebral arteries, basilar artery, and posterior cerebral arteries are patent. Venous sinuses: As permitted by contrast timing, patent. Review of the MIP images confirms the above findings IMPRESSION: No abnormal vascularity in area of hemorrhage. No hemodynamically significant stenosis. Electronically Signed   By: Guadlupe Spanish M.D.   On: 02/23/2020 21:44    Procedures .Critical Care Performed by: Terrilee Files, MD Authorized by: Terrilee Files, MD   Critical care provider statement:    Critical care time (minutes):  80   Critical care time was exclusive of:  Separately billable procedures and treating other patients   Critical care was necessary to treat or prevent imminent or life-threatening deterioration of the following conditions:  CNS failure or compromise   Critical care was time spent personally by me on the following activities:  Discussions with consultants, evaluation of patient's response to treatment, examination of patient, ordering and performing treatments and interventions, ordering and review of laboratory studies, ordering and review of radiographic studies, pulse oximetry, re-evaluation of patient's condition, obtaining history from patient or surrogate, review of old charts and development of treatment plan with patient or surrogate   I assumed direction of critical care for this patient from another provider in my specialty: no     (including critical care time)  Medications Ordered in ED Medications   stroke: mapping our early stages of recovery book (has no administration in time range)  acetaminophen (TYLENOL) tablet 650 mg (has no administration in time  range)    Or  acetaminophen (TYLENOL) 160 MG/5ML solution 650 mg (has no administration in time range)    Or  acetaminophen (TYLENOL) suppository 650 mg (has no administration in time range)  senna-docusate (Senokot-S) tablet 1 tablet (1 tablet Oral Not Given 02/24/20 1000)  pantoprazole (PROTONIX) injection 40 mg (has no administration in time range)  labetalol (NORMODYNE) injection 20 mg (0 mg Intravenous Hold 02/24/20 0236)  And  clevidipine (CLEVIPREX) infusion 0.5 mg/mL (1 mg/hr Intravenous Restarted 02/24/20 0352)  thiamine 500mg  in normal saline (80ml) IVPB (500 mg Intravenous New Bag/Given 02/24/20 1139)  dexmedetomidine (PRECEDEX) 400 MCG/100ML (4 mcg/mL) infusion (0.5 mcg/kg/hr  72.6 kg Intravenous Rate/Dose Change 02/24/20 0707)  LORazepam (ATIVAN) tablet 1-4 mg ( Oral See Alternative 02/24/20 0334)    Or  LORazepam (ATIVAN) injection 1-4 mg (4 mg Intravenous Given 02/24/20 0334)  folic acid (FOLVITE) tablet 1 mg (1 mg Oral Not Given 02/24/20 1000)  multivitamin with minerals tablet 1 tablet (1 tablet Oral Not Given 02/24/20 1000)  LORazepam (ATIVAN) injection 0-4 mg (2 mg Intravenous Given 02/24/20 1152)    Followed by  LORazepam (ATIVAN) injection 0-4 mg (has no administration in time range)  sodium chloride 0.9 % bolus 1,000 mL (0 mLs Intravenous Stopped 02/24/20 0440)    Followed by  0.9 %  sodium chloride infusion ( Intravenous New Bag/Given 02/24/20 0528)  iohexol (OMNIPAQUE) 350 MG/ML injection 100 mL (100 mLs Intravenous Contrast Given 02/23/20 2112)  levETIRAcetam (KEPPRA) 2,000 mg in sodium chloride 0.9 % 250 mL IVPB (0 mg Intravenous Stopped 02/24/20 0406)  PHENObarbital (LUMINAL) injection 100 mg (100 mg Intravenous Given 02/24/20 0417)    ED Course  I have reviewed the triage vital signs and the nursing notes.  Pertinent labs & imaging results that were available during my care of the patient were reviewed by me and considered in my medical decision making (see chart for  details).  Clinical Course as of Feb 23 1149  Tue Feb 23, 2020  1959 Discussed with Dr. 1960, neuro hospitalist.  He feels he will need the ICU done at [facility] due to his withdrawal and the bleed.   [MB]  2100 Per Dr. 2101 needs CTA head and neck, needs repeat head CT in 6 hours.  Consider teleneuro evaluation so nursing has some short-term recommendations.  Recommends keeping blood pressure systolic under 140.   [MB]    Clinical Course User Index [MB] Wilford Corner, MD   MDM Rules/Calculators/A&P                         This patient complains of neuro deficits alcohol withdrawal; this involves an extensive number of treatment Options and is a complaint that carries with it a high risk of complications and Morbidity. The differential includes alcohol withdrawal, stroke, bleed, metabolic derangement, hypoglycemia, infection  I ordered, reviewed and interpreted labs, which included CBC with normal white count normal hemoglobin, chemistries with low sodium low chloride, elevated LFTs, alcohol level negative I ordered medication IV Ativan by CIWA protocol, thiamine I ordered imaging studies which included head CT, CTA of head and neck and I independently    visualized and interpreted imaging which showed acute hemorrhagic basal ganglia intraparenchymal hemorrhage Additional history obtained from patient's father states he drinks daily Previous records obtained and reviewed in epic, no recent visits I consulted Dr. Terrilee Files neuro hospitalist, teleneurology, and discussed lab and imaging findings  Critical Interventions: Identification and treatment of alcohol withdrawal, intraparenchymal hemorrhage with benzodiazepines  After the interventions stated above, I reevaluated the patient and found patient to be more sedate.  He may need further blood pressure management with Cleviprex if greater than systolic 140s.  He was signed out to oncoming attending Dr. Wilford Corner and we reviewed his mental status  at bedside.  He is to be transferred to [facility] ICU once bed becomes available.   Final Clinical  Impression(s) / ED Diagnoses Final diagnoses:  Nontraumatic hemorrhage of cerebral hemisphere, unspecified laterality (HCC)  Alcohol withdrawal syndrome with complication [facility])    Rx / DC Orders ED Discharge Orders    None       Terrilee Files, MD 02/24/20 1159

## 2020-02-24 ENCOUNTER — Inpatient Hospital Stay (HOSPITAL_COMMUNITY): Payer: Self-pay

## 2020-02-24 DIAGNOSIS — F10239 Alcohol dependence with withdrawal, unspecified: Secondary | ICD-10-CM

## 2020-02-24 DIAGNOSIS — F10231 Alcohol dependence with withdrawal delirium: Secondary | ICD-10-CM | POA: Insufficient documentation

## 2020-02-24 DIAGNOSIS — F10939 Alcohol use, unspecified with withdrawal, unspecified: Secondary | ICD-10-CM | POA: Diagnosis present

## 2020-02-24 DIAGNOSIS — I6389 Other cerebral infarction: Secondary | ICD-10-CM

## 2020-02-24 DIAGNOSIS — R569 Unspecified convulsions: Secondary | ICD-10-CM

## 2020-02-24 DIAGNOSIS — F10931 Alcohol use, unspecified with withdrawal delirium: Secondary | ICD-10-CM | POA: Insufficient documentation

## 2020-02-24 DIAGNOSIS — I619 Nontraumatic intracerebral hemorrhage, unspecified: Secondary | ICD-10-CM

## 2020-02-24 LAB — ECHOCARDIOGRAM COMPLETE
Area-P 1/2: 4.01 cm2
Height: 71 in
S' Lateral: 3.6 cm
Weight: 2560 oz

## 2020-02-24 LAB — COMPREHENSIVE METABOLIC PANEL
ALT: 42 U/L (ref 0–44)
AST: 73 U/L — ABNORMAL HIGH (ref 15–41)
Albumin: 3.5 g/dL (ref 3.5–5.0)
Alkaline Phosphatase: 211 U/L — ABNORMAL HIGH (ref 38–126)
Anion gap: 13 (ref 5–15)
BUN: 5 mg/dL — ABNORMAL LOW (ref 6–20)
CO2: 26 mmol/L (ref 22–32)
Calcium: 9.9 mg/dL (ref 8.9–10.3)
Chloride: 91 mmol/L — ABNORMAL LOW (ref 98–111)
Creatinine, Ser: 0.64 mg/dL (ref 0.61–1.24)
GFR calc Af Amer: >60 mL/min (ref >60)
GFR calc non Af Amer: >60 mL/min (ref >60)
Glucose, Bld: 94 mg/dL (ref 70–99)
Potassium: 3.8 mmol/L (ref 3.5–5.1)
Sodium: 130 mmol/L — ABNORMAL LOW (ref 135–145)
Total Bilirubin: 1.7 mg/dL — ABNORMAL HIGH (ref 0.3–1.2)
Total Protein: 8.5 g/dL — ABNORMAL HIGH (ref 6.5–8.1)

## 2020-02-24 LAB — CBC
HCT: 38.7 % — ABNORMAL LOW (ref 39.0–52.0)
Hemoglobin: 13 g/dL (ref 13.0–17.0)
MCH: 32.3 pg (ref 26.0–34.0)
MCHC: 33.6 g/dL (ref 30.0–36.0)
MCV: 96 fL (ref 80.0–100.0)
Platelets: 222 10*3/uL (ref 150–400)
RBC: 4.03 MIL/uL — ABNORMAL LOW (ref 4.22–5.81)
RDW: 14.3 % (ref 11.5–15.5)
WBC: 5 10*3/uL (ref 4.0–10.5)
nRBC: 0 % (ref 0.0–0.2)

## 2020-02-24 LAB — URINALYSIS, ROUTINE W REFLEX MICROSCOPIC
Bacteria, UA: NONE SEEN
Bilirubin Urine: NEGATIVE
Glucose, UA: NEGATIVE mg/dL
Ketones, ur: 5 mg/dL — AB
Leukocytes,Ua: NEGATIVE
Nitrite: NEGATIVE
Protein, ur: 30 mg/dL — AB
Specific Gravity, Urine: 1.015 (ref 1.005–1.030)
pH: 6 (ref 5.0–8.0)

## 2020-02-24 LAB — RAPID URINE DRUG SCREEN, HOSP PERFORMED
Amphetamines: NOT DETECTED
Barbiturates: POSITIVE — AB
Benzodiazepines: POSITIVE — AB
Cocaine: NOT DETECTED
Opiates: NOT DETECTED
Tetrahydrocannabinol: NOT DETECTED

## 2020-02-24 LAB — AMMONIA: Ammonia: 40 umol/L — ABNORMAL HIGH (ref 9–35)

## 2020-02-24 LAB — MRSA PCR SCREENING: MRSA by PCR: NEGATIVE

## 2020-02-24 LAB — PHOSPHORUS: Phosphorus: 3.9 mg/dL (ref 2.5–4.6)

## 2020-02-24 LAB — MAGNESIUM: Magnesium: 1.7 mg/dL (ref 1.7–2.4)

## 2020-02-24 LAB — HIV ANTIBODY (ROUTINE TESTING W REFLEX): HIV Screen 4th Generation wRfx: NONREACTIVE

## 2020-02-24 MED ORDER — LORAZEPAM 2 MG/ML IJ SOLN
0.0000 mg | INTRAMUSCULAR | Status: DC
  Administered 2020-02-24 (×4): 2 mg via INTRAVENOUS
  Filled 2020-02-24: qty 1
  Filled 2020-02-24: qty 2
  Filled 2020-02-24 (×3): qty 1

## 2020-02-24 MED ORDER — PANTOPRAZOLE SODIUM 40 MG IV SOLR
40.0000 mg | Freq: Every day | INTRAVENOUS | Status: DC
  Administered 2020-02-24 – 2020-02-26 (×3): 40 mg via INTRAVENOUS
  Filled 2020-02-24 (×3): qty 40

## 2020-02-24 MED ORDER — SENNOSIDES-DOCUSATE SODIUM 8.6-50 MG PO TABS
1.0000 | ORAL_TABLET | Freq: Two times a day (BID) | ORAL | Status: DC
  Filled 2020-02-24 (×3): qty 1

## 2020-02-24 MED ORDER — SODIUM CHLORIDE 0.9 % IV SOLN: INTRAVENOUS | Status: DC

## 2020-02-24 MED ORDER — CHLORHEXIDINE GLUCONATE CLOTH 2 % EX PADS
6.0000 | MEDICATED_PAD | Freq: Every day | CUTANEOUS | Status: DC
  Administered 2020-02-24 – 2020-03-09 (×15): 6 via TOPICAL

## 2020-02-24 MED ORDER — PHENOBARBITAL SODIUM 130 MG/ML IJ SOLN
100.0000 mg | Freq: Once | INTRAMUSCULAR | Status: AC
  Administered 2020-02-24: 100 mg via INTRAVENOUS
  Filled 2020-02-24: qty 0.77

## 2020-02-24 MED ORDER — FOLIC ACID 5 MG/ML IJ SOLN
1.0000 mg | Freq: Every day | INTRAMUSCULAR | Status: DC
  Filled 2020-02-24: qty 0.2

## 2020-02-24 MED ORDER — FOLIC ACID 1 MG PO TABS: 1.0000 mg | ORAL_TABLET | Freq: Every day | ORAL | Status: DC

## 2020-02-24 MED ORDER — LORAZEPAM 1 MG PO TABS: 1.0000 mg | ORAL_TABLET | ORAL | Status: AC | PRN

## 2020-02-24 MED ORDER — ACETAMINOPHEN 325 MG PO TABS: 650.0000 mg | ORAL_TABLET | ORAL | Status: DC | PRN

## 2020-02-24 MED ORDER — ACETAMINOPHEN 160 MG/5ML PO SOLN
650.0000 mg | ORAL | Status: DC | PRN
  Administered 2020-03-01 – 2020-03-04 (×3): 650 mg
  Filled 2020-02-24 (×3): qty 20.3

## 2020-02-24 MED ORDER — LORAZEPAM 2 MG/ML IJ SOLN
1.0000 mg | INTRAMUSCULAR | Status: AC | PRN
  Administered 2020-02-24: 4 mg via INTRAVENOUS
  Administered 2020-02-24: 2 mg via INTRAVENOUS
  Filled 2020-02-24: qty 1

## 2020-02-24 MED ORDER — ADULT MULTIVITAMIN W/MINERALS CH: 1.0000 | ORAL_TABLET | Freq: Every day | ORAL | Status: DC

## 2020-02-24 MED ORDER — LABETALOL HCL 5 MG/ML IV SOLN: 20.0000 mg | Freq: Once | INTRAVENOUS | Status: DC

## 2020-02-24 MED ORDER — DEXMEDETOMIDINE HCL IN NACL 400 MCG/100ML IV SOLN
0.2000 ug/kg/h | INTRAVENOUS | Status: DC
  Administered 2020-02-24 – 2020-02-25 (×3): 0.5 ug/kg/h via INTRAVENOUS
  Administered 2020-02-26: 0.6 ug/kg/h via INTRAVENOUS
  Administered 2020-02-26: 0.4 ug/kg/h via INTRAVENOUS
  Administered 2020-02-27: 0.5 ug/kg/h via INTRAVENOUS
  Administered 2020-02-27: 0.6 ug/kg/h via INTRAVENOUS
  Administered 2020-02-28: 0.2 ug/kg/h via INTRAVENOUS
  Filled 2020-02-24 (×9): qty 100

## 2020-02-24 MED ORDER — THIAMINE HCL 100 MG/ML IJ SOLN
500.0000 mg | Freq: Three times a day (TID) | INTRAVENOUS | Status: AC
  Administered 2020-02-24 – 2020-02-26 (×9): 500 mg via INTRAVENOUS
  Filled 2020-02-24 (×10): qty 5

## 2020-02-24 MED ORDER — CLEVIDIPINE BUTYRATE 0.5 MG/ML IV EMUL
0.0000 mg/h | INTRAVENOUS | Status: DC
  Administered 2020-02-24: 1.5 mg/h via INTRAVENOUS
  Administered 2020-02-25 – 2020-02-26 (×3): 2 mg/h via INTRAVENOUS
  Administered 2020-02-27: 1 mg/h via INTRAVENOUS
  Filled 2020-02-24 (×6): qty 50

## 2020-02-24 MED ORDER — STROKE: EARLY STAGES OF RECOVERY BOOK
Freq: Once | Status: DC
  Filled 2020-02-24: qty 1

## 2020-02-24 MED ORDER — LORAZEPAM 2 MG/ML IJ SOLN
0.0000 mg | Freq: Three times a day (TID) | INTRAMUSCULAR | Status: DC
  Administered 2020-02-26: 3 mg via INTRAVENOUS
  Administered 2020-02-26: 1 mg via INTRAVENOUS
  Filled 2020-02-24: qty 2
  Filled 2020-02-24: qty 1

## 2020-02-24 MED ORDER — ACETAMINOPHEN 650 MG RE SUPP
650.0000 mg | RECTAL | Status: DC | PRN
  Administered 2020-02-25: 650 mg via RECTAL
  Filled 2020-02-24: qty 1

## 2020-02-24 MED ORDER — SODIUM CHLORIDE 0.9 % IV BOLUS
1000.0000 mL | Freq: Once | INTRAVENOUS | Status: AC
  Administered 2020-02-24: 1000 mL via INTRAVENOUS

## 2020-02-24 MED ORDER — DEXMEDETOMIDINE HCL IN NACL 400 MCG/100ML IV SOLN
0.2000 ug/kg/h | INTRAVENOUS | Status: DC
  Administered 2020-02-24: 0.2 ug/kg/h via INTRAVENOUS
  Filled 2020-02-24: qty 100

## 2020-02-24 MED ORDER — SODIUM CHLORIDE 0.9 % IV SOLN
2000.0000 mg | INTRAVENOUS | Status: AC
  Administered 2020-02-24: 2000 mg via INTRAVENOUS
  Filled 2020-02-24: qty 20

## 2020-02-24 MED ORDER — CLEVIDIPINE BUTYRATE 0.5 MG/ML IV EMUL
INTRAVENOUS | Status: AC
  Administered 2020-02-24: 1 mg/h via INTRAVENOUS
  Filled 2020-02-24: qty 50

## 2020-02-24 NOTE — ED Notes (Signed)
Neuro checks based off of a sedated pt.

## 2020-02-24 NOTE — Progress Notes (Signed)
EEG complete - results pending 

## 2020-02-24 NOTE — Procedures (Signed)
Patient Name: Tommy Garcia  MRN: [id number]  Epilepsy Attending: Charlsie Quest  Referring Physician/Provider: Dr Milon Dikes Date: 02/24/2020 Duration: 23.32 mins  Patient history: 39yo M with right basal ganglia ICH. EEG to evaluate for seizure.  Level of alertness: Awake, asleep  AEDs during EEG study: ativan  Technical aspects: This EEG study was done with scalp electrodes positioned according to the 10-20 International system of electrode placement. Electrical activity was acquired at a sampling rate of 500Hz  and reviewed with a high frequency filter of 70Hz  and a low frequency filter of 1Hz . EEG data were recorded continuously and digitally stored.   Description: No posterior dominant. Sleep was characterized by vertex waves, sleep spindles (12 to 14 Hz), maximal frontocentral region.  EEG showed continuous generalized 3-5hz  theta-delta slowing admixed with 15 to 18 Hz beta activity distributed symmetrically and diffusely. One episode of right arm twitching was recorded at 1631. Concomitant eeg before, during and after the event didnt show any eeg change. Hyperventilation and photic stimulation were not performed.     ABNORMALITY - Continuous slow, generalized  - Excessive beta, generalized  IMPRESSION: This study is suggestive of moderate diffuse encephalopathy, nonspecific etiology. The excessive beta activity seen in the background is most likely due to the effect of benzodiazepine and is a benign EEG pattern. One episode of right arm twitching was noted at 1631 without concomitant eeg change and was likely not epileptic. Of note, focal motor seizures may not be seen on scalp eeg. Therefore, clinical correlation is recommended. No epileptiform discharges were seen throughout the recording.  Nichelle Renwick 

## 2020-02-24 NOTE — ED Notes (Signed)
Pt is not longer agitated. Pt was sedated. RN spoke with MD Horton about pt etCO2 rate being around 10 and Sp02 around 90% on 2Lo2. MD advised to come down off of the Precedex and see how the patient does oxygenation as well as agitation.

## 2020-02-24 NOTE — Consult Note (Signed)
[facility] TeleNeurology Consult Services  Stat Consult  Date of Service:   02/23/2020 22:09:42  Impression:     .  I61.9 - Intracerebral haemorrhage, unspecified  Comments/Sign-Out: Tommy Garcia is a 39 yo M w/ a hx of HTN and alcohol abuse who presents with left sided weakness. Was found down by his father today. True LKN is unclear. Patient has several alcoholic drinks per day. Here, exam is notable for drowsiness, disorientation, left facial weakness, dysarthria, and left hemiparesis. Head CT showed an acute right BG ICH. ICH likely related to HTN. Recommend the following:  - neurosurgery consultation  - repeat CT head 6 hours from prior  - goal SBP < 160  - NPO until SLP eval  - PT/OT  - agree with thiamine supplementation - CIWA per protocol - Neurology to follow  CT HEAD: Reviewed acute right BG hemorrhage  Metrics: [facility] Notification Time: 02/23/2020 22:08:17 Stamp Time: 02/23/2020 22:09:42 Callback Response Time: 02/23/2020 22:10:05  Our recommendations are outlined below.   Therapies:     .  Physical Therapy, Occupational Therapy, Speech Therapy Assessment When Applicable  Disposition: Neurology Follow Up Recommended  Sign Out:     .  Discussed with Rapid Response Team  ----------------------------------------------------------------------------------------------------  Chief Complaint: left sided weakness  History of Present Illness: Patient is a 39 year old Male.  Tommy Garcia is a 39 yo M w/ a hx of HTN and alcohol abuse who presents with left sided weakness. Was found down by his father today. True LKN is unclear. Patient has several alcoholic drinks per day. Here, exam is notable for drowsiness, disorientation, left facial weakness, dysarthria, and left hemiparesis. Head CT showed an acute right BG ICH.   Past Medical History:     . Hypertension      Examination: BP(134/91), Pulse(85), Blood Glucose(102) 1A: Level of  Consciousness - Arouses to minor stimulation + 1 1B: Ask Month and Age - Both Questions Right + 0 1C: Blink Eyes & Squeeze Hands - Performs Both Tasks + 0 2: Test Horizontal Extraocular Movements - Normal + 0 3: Test Visual Fields - No Visual Loss + 0 4: Test Facial Palsy (Use Grimace if Obtunded) - Minor paralysis (flat nasolabial fold, smile asymmetry) + 1 5A: Test Left Arm Motor Drift - Drift, hits bed + 2 5B: Test Right Arm Motor Drift - No Drift for 10 Seconds + 0 6A: Test Left Leg Motor Drift - Drift, hits bed + 2 6B: Test Right Leg Motor Drift - Drift, hits bed + 2 7: Test Limb Ataxia (FNF/Heel-Shin) - No Ataxia + 0 8: Test Sensation - Mild-Moderate Loss: Less Sharp/More Dull + 1 9: Test Language/Aphasia - Normal; No aphasia + 0 10: Test Dysarthria - Mild-Moderate Dysarthria: Slurring but can be understood + 1 11: Test Extinction/Inattention - No abnormality + 0  NIHSS Score: 10   Patient/Family was informed the Neurology Consult would occur via TeleHealth consult by way of interactive audio and video telecommunications and consented to receiving care in this manner.  Patient is being evaluated for possible acute neurologic impairment and high probability of imminent or life-threatening deterioration. I spent total of 20 minutes providing care to this patient, including time for face to face visit via telemedicine, review of medical records, imaging studies and discussion of findings with providers, the patient and/or family.   Dr Vicente Masson   [facility] [phone]  Case [id number]

## 2020-02-24 NOTE — Consult Note (Signed)
NAME:  Tommy Garcia, MRN:  [id number], DOB:  18-Feb-1981, LOS: 1 ADMISSION DATE:  02/23/2020, CONSULTATION DATE:  02/24/20 REFERRING MD:  Wilford Corner  CHIEF COMPLAINT:  DT's   Brief History   Tommy Garcia is a 39 y.o. male who was admitted 7/27 with BG ICH and DT's.  PCCM asked to assist with management of DT's.  History of present illness   Pt is encephelopathic; therefore, this HPI is obtained from chart review. Tommy Garcia is a 39 y.o. male who has a PMH including but not limited to HTN, EtOH abuse (see "past medical history" for rest).  He presented to [facility] 7/27 with left sided weakness after being found by his father at his home down on the floor near a chair where he had been sitting in prior.  Last known normal time is unclear.  Pt drinks heavily but has apparently been trying to cut down recently.  He had head CT that demonstrated BG ICH felt to be due to underlying HTN.  He was transferred to [facility] for further management by neurology.  Upon arrival to [facility] ED, he was started on precedex for DT's and PCCM was subsequently asked to assist with management.  Past Medical History  has ABSCESS, TOOTH; Hypertension; and ICH (intracerebral hemorrhage) (HCC) on their problem list.  Significant Hospital Events   7/27 > transferred from [facility].  Consults:  Neurosurgery, PCCM.  Procedures:  None.  Significant Diagnostic Tests:  CT head 7/27 > right BG hemorrhage. CTA head 7/27 > neg.  Micro Data:  COVID 7/27 > neg.  Antimicrobials:  None.   Interim history/subjective:  Confused, mumbling words.  In right sided restraints.  Objective:  Blood pressure (!) 127/112, pulse 62, temperature 99.5 F (37.5 C), temperature source Oral, resp. rate (!) 25, height 5\' 11"  (1.803 m), weight 72.6 kg, SpO2 98 %.       No intake or output data in the 24 hours ending 02/24/20 0254 Filed Weights   02/23/20 1911  Weight: 72.6 kg    Examination: General: Adult male, in NAD. Neuro: Confused, not  following commands. HEENT: [place]/AT. Sclerae anicteric. Eyes closed. Cardiovascular: Tachy, regular, no M/R/G.  Lungs: Respirations even and unlabored.  CTA bilaterally, No W/R/R.  Abdomen: BS x 4, soft, NT/ND.  Musculoskeletal: No gross deformities, no edema.  Skin: Intact, warm, no rashes.  Assessment & Plan:   BG ICH - felt to be 2/2 underlying HTN. - Management per neurology. - Cleviprex for goal SBP < 160. - F/u on repeat CT head. - Will likely need antihypertensive medication regimen upon discharge.  DT's - heavy EtOH use with reports of recently cutting down. - Continue precedex for now. - Continue PRN ativan per CIWA protocol. - Continue thiamin / folate. - EtOH cessation counseling.  Best Practice:  Diet: NPO. Pain/Anxiety/Delirium protocol (if indicated): N/A. VAP protocol (if indicated): N/A. DVT prophylaxis: Per primary. GI prophylaxis: N/A. Glucose control: N/A. Mobility: Bedrest. Code Status: Full. Family Communication: Per primary. Disposition: ICU.  Labs   CBC: Recent Labs  Lab 02/23/20 2009  WBC 5.8  NEUTROABS 4.9  HGB 13.2  HCT 38.5*  MCV 96.0  PLT 209   Basic Metabolic Panel: Recent Labs  Lab 02/23/20 2009  NA 127*  K 4.1  CL 88*  CO2 26  GLUCOSE 102*  BUN 7  CREATININE 0.53*  CALCIUM 9.7   GFR: Estimated Creatinine Clearance: 126 mL/min (A) (by C-G formula based on SCr of 0.53 mg/dL (L)).  Recent Labs  Lab 02/23/20 2009  WBC 5.8   Liver Function Tests: Recent Labs  Lab 02/23/20 2009  AST 80*  ALT 46*  ALKPHOS 234*  BILITOT 1.7*  PROT 8.9*  ALBUMIN 3.9   No results for input(s): LIPASE, AMYLASE in the last 168 hours. No results for input(s): AMMONIA in the last 168 hours. ABG No results found for: PHART, PCO2ART, PO2ART, HCO3, TCO2, ACIDBASEDEF, O2SAT  Coagulation Profile: Recent Labs  Lab 02/23/20 2009  INR 1.1   Cardiac Enzymes: Recent Labs  Lab 02/23/20 2009  CKTOTAL 282   HbA1C: No results found for:  HGBA1C CBG: No results for input(s): GLUCAP in the last 168 hours.  Review of Systems:   Unable to obtain as pt is encephalopathic.  Past medical history  He,  has a past medical history of Alcohol abuse and Hypertension.   Surgical History    Past Surgical History:  Procedure Laterality Date  . HAND TENDON SURGERY Right   . OPEN REDUCTION INTERNAL FIXATION (ORIF) DISTAL RADIAL FRACTURE Left 12/26/2017   Procedure: OPEN REDUCTION INTERNAL FIXATION (ORIF) DISTAL RADIAL FRACTURE,;  Surgeon: Betha Loa, MD;  Location: [facility];  Service: Orthopedics;  Laterality: Left;  . WRIST SURGERY Left around 73     Social History   reports that he quit smoking about 2 months ago. His smoking use included cigarettes. He has a 25.00 pack-year smoking history. He has never used smokeless tobacco. He reports current alcohol use of about 12.0 standard drinks of alcohol per week. He reports previous drug use. Drug: Marijuana.   Family history   His family history is not on file.   Allergies No Known Allergies   Home meds  Prior to Admission medications   Not on File    Critical care time: 30 min.    [name], [place] [facility] 02/24/2020, 2:54 AM

## 2020-02-24 NOTE — ED Notes (Signed)
RN changed out etCO2 cord from [facility], pt etCO2  is now reading 15-20 etCO2.

## 2020-02-24 NOTE — Progress Notes (Signed)
  Echocardiogram 2D Echocardiogram has been performed.  Leta Jungling M 02/24/2020, 9:42 AM

## 2020-02-24 NOTE — Progress Notes (Signed)
STROKE TEAM PROGRESS NOTE   INTERVAL HISTORY His mom is at the bedside.  Pt lying in bed, snoring, tachypnea, still in DT. However, he was able to briefly open eyes for me, and able to tell me his age and mom's name. However, intangible words with severe dysarthria. He was able to count fingers in all 4 quadrants. Able to follow midline commands but not peripheral commands. Still has left facial droop and left hemiparesis.   Vitals:   02/24/20 0827 02/24/20 0830 02/24/20 0832 02/24/20 0835  BP: 118/78 122/84  118/73  Pulse: 65 70 66 66  Resp: _0 Temp:      TempSrc:      SpO2: 99% 99% 98% 98%  Weight:      Height:       CBC:  Recent Labs  Lab 02/23/20 2009 02/24/20 0325  WBC 5.8 5.0  NEUTROABS 4.9  --   HGB 13.2 13.0  HCT 38.5* 38.7*  MCV 96.0 96.0  PLT 209 078   Basic Metabolic Panel:  Recent Labs  Lab 02/23/20 2009 02/24/20 0325  NA 127* 130*  K 4.1 3.8  CL 88* 91*  CO2 26 26  GLUCOSE 102* 94  BUN 7 5*  CREATININE 0.53* 0.64  CALCIUM 9.7 9.9  MG  --  1.7  PHOS  --  3.9   Lipid Panel: No results for input(s): CHOL, TRIG, HDL, CHOLHDL, VLDL, LDLCALC in the last 168 hours. HgbA1c: No results for input(s): HGBA1C in the last 168 hours. Urine Drug Screen: No results for input(s): LABOPIA, COCAINSCRNUR, LABBENZ, AMPHETMU, THCU, LABBARB in the last 168 hours.  Alcohol Level  Recent Labs  Lab 02/23/20 2009  ETH <10    IMAGING past 24 hours CT Angio Head W or Wo Contrast  Result Date: 02/23/2020 CLINICAL DATA:  Weakness, intracranial hemorrhage EXAM: CT ANGIOGRAPHY HEAD AND NECK TECHNIQUE: Multidetector CT imaging of the head and neck was performed using the standard protocol during bolus administration of intravenous contrast. Multiplanar CT image reconstructions and MIPs were obtained to evaluate the vascular anatomy. Carotid stenosis measurements (when applicable) are obtained utilizing NASCET criteria, using the distal internal carotid diameter as the  denominator. CONTRAST:  152m OMNIPAQUE IOHEXOL 350 MG/ML SOLN COMPARISON:  Correlation made with prior head CT FINDINGS: CTA NECK FINDINGS Aortic arch: Great vessel origins are patent. Right carotid system: Patent. No measurable stenosis at the ICA origin. Left carotid system: Patent. No measurable stenosis at the ICA origin. Vertebral arteries: Patent. Left vertebral artery is slightly dominant. Skeleton: No significant abnormality. Other neck: No mass or adenopathy. Upper chest: No apical lung mass. Mild cystic change versus centrilobular emphysema Review of the MIP images confirms the above findings CTA HEAD FINDINGS Anterior circulation: Intracranial internal carotid arteries are patent. Anterior and middle cerebral arteries are patent. There is no contrast blush or abnormal vascularity in the region parenchymal hemorrhage. Posterior circulation: Intracranial vertebral arteries, basilar artery, and posterior cerebral arteries are patent. Venous sinuses: As permitted by contrast timing, patent. Review of the MIP images confirms the above findings IMPRESSION: No abnormal vascularity in area of hemorrhage. No hemodynamically significant stenosis. Electronically Signed   By: PMacy MisM.D.   On: 02/23/2020 21:44   CT Head Wo Contrast  Result Date: 02/24/2020 CLINICAL DATA:  Stroke follow-up. EXAM: CT HEAD WITHOUT CONTRAST TECHNIQUE: Contiguous axial images were obtained from the base of the skull through the vertex without intravenous contrast. COMPARISON:  Yesterday FINDINGS: Brain: 16 cc  acute hematoma centered at the right external capsule and putamen is unchanged in size and shape. Mild vasogenic edema is also stable. Local mass effect. No entrapment or acute infarct. Vascular: Recent CTA.  No acute finding Skull: No traumatic or aggressive finding Sinuses/Orbits: Completely opacified left maxillary sinus with sclerotic wall thickening. IMPRESSION: Size stable 16 cc hematoma at the right basal ganglia.  Electronically Signed   By: Jonathon  Watts M.D.   On: 02/24/2020 05:21   CT HEAD WO CONTRAST  Result Date: 02/23/2020 CLINICAL DATA:  Increasing weakness EXAM: CT HEAD WITHOUT CONTRAST TECHNIQUE: Contiguous axial images were obtained from the base of the skull through the vertex without intravenous contrast. COMPARISON:  12/19/2017 FINDINGS: Brain: There is a geographic area of hemorrhage identified in the region of the basal ganglia on the right measuring 4.8 x 2.1 cm in greatest AP and transverse dimensions respectively. It extends approximately 3.9 cm in craniocaudad length. Mild surrounding edema is noted. Mild atrophic changes are seen. Chronic white matter ischemic changes are noted. No other hemorrhage is noted. No definitive intraventricular involvement is seen. Mild midline shift is noted from right to left secondary to the hemorrhage. This is approximately 4 mm in dimension. Vascular: No hyperdense vessel or unexpected calcification. Skull: Normal. Negative for fracture or focal lesion. Sinuses/Orbits: Opacification of the left maxillary antrum is noted increased from the prior exam. Orbits appear within normal limits. Other: None IMPRESSION: New area of hemorrhage within the right basal ganglia likely related to hypertensive bleed. Mild midline shift is noted from right to left. Critical Value/emergent results were called by telephone at the time of interpretation on 02/23/2020 at 7:52 pm to Dr. MICHAEL BUTLER , who verbally acknowledged these results. Electronically Signed   By: Mark  Lukens M.D.   On: 02/23/2020 19:53   CT Angio Neck W and/or Wo Contrast  Result Date: 02/23/2020 CLINICAL DATA:  Weakness, intracranial hemorrhage EXAM: CT ANGIOGRAPHY HEAD AND NECK TECHNIQUE: Multidetector CT imaging of the head and neck was performed using the standard protocol during bolus administration of intravenous contrast. Multiplanar CT image reconstructions and MIPs were obtained to evaluate the vascular  anatomy. Carotid stenosis measurements (when applicable) are obtained utilizing NASCET criteria, using the distal internal carotid diameter as the denominator. CONTRAST:  100mL OMNIPAQUE IOHEXOL 350 MG/ML SOLN COMPARISON:  Correlation made with prior head CT FINDINGS: CTA NECK FINDINGS Aortic arch: Great vessel origins are patent. Right carotid system: Patent. No measurable stenosis at the ICA origin. Left carotid system: Patent. No measurable stenosis at the ICA origin. Vertebral arteries: Patent. Left vertebral artery is slightly dominant. Skeleton: No significant abnormality. Other neck: No mass or adenopathy. Upper chest: No apical lung mass. Mild cystic change versus centrilobular emphysema Review of the MIP images confirms the above findings CTA HEAD FINDINGS Anterior circulation: Intracranial internal carotid arteries are patent. Anterior and middle cerebral arteries are patent. There is no contrast blush or abnormal vascularity in the region parenchymal hemorrhage. Posterior circulation: Intracranial vertebral arteries, basilar artery, and posterior cerebral arteries are patent. Venous sinuses: As permitted by contrast timing, patent. Review of the MIP images confirms the above findings IMPRESSION: No abnormal vascularity in area of hemorrhage. No hemodynamically significant stenosis. Electronically Signed   By: Praneil  Patel M.D.   On: 02/23/2020 21:44    PHYSICAL EXAM  Temp:  [99.5 F (37.5 C)] 99.5 F (37.5 C) (07/28 0232) Pulse Rate:  [56-155] 77 (07/28 1415) Resp:  [13-34] 27 (07/28 1415) BP: (90-156)/(54-112) 124/86 (07/28 1415)   SpO2:  [90 %-100 %] 98 % (07/28 1415) Weight:  [72.6 kg] 72.6 kg (07/27 1911)  General - Well nourished, well developed, drowsy sleepy, snoring with tachypnea.  Ophthalmologic - fundi not visualized due to noncooperation.  Cardiovascular - Regular rhythm and rate.  Neuro - drowsy sleepy snoring. Able to briefly open eyes with repetitive stimulation. Able to  tell me his age and mom's name, but not orientated to place or time. Very limited verbal output, severe dysarthria, follow some midline commands but not peripheral commands. Able to count fingers in all quadrants, not consistently blinking eyes for visual threat, PERRL, EOMI. Left facial droop. Tongue midline. LUE 2+/5 and LLE withdraw 2-/5 on pain. RUE against gravity at least, RLE 3/5 withdraw on pain. On soft restrain of RUE and RLE. Sensation, coordination and gait not tested.   ASSESSMENT/PLAN [name] is a 38 y.o. male with history of Etoh abuse, HTN presenting to [facility] with L sided weakness, R gaze preference in DTs.   Stroke: R basal ganglia ICH likely secondary to HTN   CT head R basal ganglia hemorrhage w/ R->L midline shift   CTA head & neck Unremarkable   Repeat CT head stable hemorrhage   2D Echo EF 50-55%. No source of embolus   EEG pending   LDL pending   HgbA1c pending   VTE prophylaxis - SCDs   No antithrombotic prior to admission, now on No antithrombotic given hemorrhage   Therapy recommendations:  pending   Disposition:  pending   Hypertensive emergency  Home meds:  None listed  On cleviprex . BP stable now . SBP goal < 160 . Long-term BP goal normotensive  ETOH withdraw Delirium Tremens   Ativan for DTs  CIWA protocol  alcohol level <10  On precedex  Thiamine/folate/MVI  advised to drink no more than 2 drink(s) a day  As per mom, pt drinks 12 beers average per day  Tachypnea   Likely related to alcohol withdraw and sedation  O2 sat WNL  ICU monitoring  Intubated if needed - discussed with mom who agreed  Tobacco abuse  Quit smoking 2 mos ago  Smoking cessation counseling will be provided to continue cessation   Other Stroke Risk Factors  Substance abuse  - UDS pending  Other Active Problems  transaminitis AST/ALT 80/46->73/42, Alk Phos 234->211   Hospital day # 1  This patient is critically  ill due to right BG hemorrhage, cerebral edema, hypertensive emergency, alcohol withdrawal with delirium and tremor, tachypnea and at significant risk of neurological worsening, death form hematoma expansion, brain herniation, hypertensive encephalopathy, DT, respiratory failure, heart failure. This patient's care requires constant monitoring of vital signs, hemodynamics, respiratory and cardiac monitoring, review of multiple databases, neurological assessment, discussion with family, other specialists and medical decision making of high complexity. I spent 30 minutes of neurocritical care time in the care of this patient. I had long discussion with pt Mom at bedside, updated pt current condition, treatment plan and potential prognosis, and answered all the questions. She expressed understanding and appreciation.    , MD PhD Stroke Neurology 02/24/2020 6:00 PM   To contact Stroke Continuity provider, please refer to [email or web address]. After hours, contact General Neurology   

## 2020-02-24 NOTE — Progress Notes (Signed)
PT Cancellation Note  Patient Details Name: Tommy Garcia MRN: [id number] DOB: 04/06/1981   Cancelled Treatment:    Reason Eval/Treat Not Completed: Active bedrest order Pt currently with bedrest orders. Will follow up as schedule allows.   Farley Ly, PT, DPT  Acute Rehabilitation Services  Pager: [phone] Office: [phone]    Lehman Prom 02/24/2020, 10:26 AM

## 2020-02-24 NOTE — ED Notes (Signed)
Informed pt's father that pt is going to be transferred to [facility] ED.

## 2020-02-24 NOTE — ED Notes (Signed)
RN received report from [facility].

## 2020-02-24 NOTE — ED Notes (Addendum)
Pt is currently 100% on 2Lo2, etCO2 of 13, 17RR. Pt is less sedated, but not as agitated as before. When you touch the patient he jerks away in response.

## 2020-02-24 NOTE — Progress Notes (Signed)
NAME:  Tommy Garcia, MRN:  [id number], DOB:  08/13/1980, LOS: 1 ADMISSION DATE:  02/23/2020, CONSULTATION DATE:  02/24/20 REFERRING MD:  Wilford Corner  CHIEF COMPLAINT:  DT's   Brief History   Tommy Garcia is a 39 y.o. male who was admitted 7/27 with BG ICH and DT's.  PCCM asked to assist with management of DT's.  History of present illness   Pt is encephelopathic; therefore, this HPI is obtained from chart review. Tommy Garcia is a 39 y.o. male who has a PMH including but not limited to HTN, EtOH abuse (see "past medical history" for rest).  He presented to [facility] 7/27 with left sided weakness after being found by his father at his home down on the floor near a chair where he had been sitting in prior.  Last known normal time is unclear.  Pt drinks heavily but has apparently been trying to cut down recently.  He had head CT that demonstrated BG ICH felt to be due to underlying HTN.  He was transferred to [facility] for further management by neurology.  Upon arrival to [facility] ED, he was started on precedex for DT's and PCCM was subsequently asked to assist with management.  Past Medical History  has ABSCESS, TOOTH; Hypertension; ICH (intracerebral hemorrhage) (HCC); Alcohol withdrawal syndrome with complication (HCC); and DTs (delirium tremens) (HCC) on their problem list.  Significant Hospital Events   7/27 > transferred from [facility].  Consults:  Neurosurgery, PCCM.  Procedures:  None.  Significant Diagnostic Tests:  CT head 7/27 > right BG hemorrhage. CTA head 7/27 > neg. CT head 7/28 > Size stable 16 cc hematoma at the right basal ganglia.  Micro Data:  COVID 7/27 > neg.  Antimicrobials:  None.   Interim history/subjective:  Sleeping in ED bed. Difficult to arouse. Awaiting ICU bed. Remains on precedex, cleviprex  Objective:  Blood pressure 118/73, pulse 66, temperature 99.5 F (37.5 C), temperature source Oral, resp. rate 21, height 5\' 11"  (1.803 m), weight 72.6 kg, SpO2 98 %.         Intake/Output Summary (Last 24 hours) at 02/24/2020 0928 Last data filed at 02/24/2020 0440 Gross per 24 hour  Intake 1302.91 ml  Output --  Net 1302.91 ml   Filed Weights   02/23/20 1911  Weight: 72.6 kg    Examination: General: Adult male sedated Neuro: Sedated.  HEENT: [place]/AT, PERRL, no JVD Cardiovascular: RRR, no MRG Lungs: Sonorous respiration. Clear. EtCo2 reading 25-30 .  Abdomen: BS x 4, soft, NT/ND.  Musculoskeletal: No gross deformities, no edema.  Skin: Intact, warm, no rashes.  Assessment & Plan:    ICH: right basal ganglia - felt to be 2/2 underlying HTN. - Management per neurology. - Cleviprex for goal SBP < 160. - On no anti-HTN medications PTA, will need to initiate oral regimen once taking POs.   DT's - heavy EtOH use with reports of recently cutting down. - Continue precedex for now for RASS goal -1 to -2.  - EtCO2 monitoring to continue. Notify MD if greater than 45.  - Continue PRN ativan per CIWA protocol. - Loaded with 100 mg phenobarbital  - Continue thiamin / folate. - EtOH cessation counseling.  Hyponatremia:  - Gentle IVF - Follow BMP.   Transaminitis: improving - trend  Best Practice:  Diet: NPO. Pain/Anxiety/Delirium protocol (if indicated): Precedex per protocol VAP protocol (if indicated): N/A. DVT prophylaxis: Per primary. GI prophylaxis: N/A. Glucose control: N/A. Mobility: Bedrest. Code Status: Full. Family Communication:  Per primary. Disposition: ICU.  Labs   CBC: Recent Labs  Lab 02/23/20 2009 02/24/20 0325  WBC 5.8 5.0  NEUTROABS 4.9  --   HGB 13.2 13.0  HCT 38.5* 38.7*  MCV 96.0 96.0  PLT 209 222   Basic Metabolic Panel: Recent Labs  Lab 02/23/20 2009 02/24/20 0325  NA 127* 130*  K 4.1 3.8  CL 88* 91*  CO2 26 26  GLUCOSE 102* 94  BUN 7 5*  CREATININE 0.53* 0.64  CALCIUM 9.7 9.9  MG  --  1.7  PHOS  --  3.9   GFR: Estimated Creatinine Clearance: 126 mL/min (by C-G formula based on SCr of 0.64  mg/dL). Recent Labs  Lab 02/23/20 2009 02/24/20 0325  WBC 5.8 5.0   Liver Function Tests: Recent Labs  Lab 02/23/20 2009 02/24/20 0325  AST 80* 73*  ALT 46* 42  ALKPHOS 234* 211*  BILITOT 1.7* 1.7*  PROT 8.9* 8.5*  ALBUMIN 3.9 3.5   No results for input(s): LIPASE, AMYLASE in the last 168 hours. Recent Labs  Lab 02/24/20 0327  AMMONIA 40*   ABG No results found for: PHART, PCO2ART, PO2ART, HCO3, TCO2, ACIDBASEDEF, O2SAT  Coagulation Profile: Recent Labs  Lab 02/23/20 2009  INR 1.1   Cardiac Enzymes: Recent Labs  Lab 02/23/20 2009  CKTOTAL 282   HbA1C: No results found for: HGBA1C CBG: No results for input(s): GLUCAP in the last 168 hours.    Critical care time: 30 min.    Joneen Roach, AGACNP-BC [place] [facility]  See Amion for personal pager PCCM on call pager [phone]  02/24/2020 9:35 AM

## 2020-02-24 NOTE — ED Notes (Signed)
RN believes etco2 monitor is malfunctioning. Replaced monitor, etco2 is now reading 33 with good waveform.

## 2020-02-24 NOTE — H&P (Signed)
STROKE NEUROLOGY H&P - ICH   CC: ICH  History is obtained from: chart  HPI: Tommy Garcia is a 39 y.o. male PMH of Etoh abuse, HTN, presented to [facility], with complaints of bilateral leg weakness and on examination noted to have left hemiparesis.  Imaging revealed a right basal ganglia bleed and the ED provider called me for transfer to [facility].  I accepted the patient to my service once a bed was available. The patient stayed for a prolonged period of time and in the emergency room at [facility].  I called back Dr. Charm Barges and advised that they should get a telemedicine neurology consultation anticipating delays in transfer to the patient.  He was seen by Dr. Lilian Kapur from telemedicine neurology while at [facility] pending transfer. The patient was eventually transferred to [facility] emergency room from the [facility] emergency room. He seemed to be an extensive DTs. He had received multiple doses of Ativan.  His blood pressures on arrival were 130s to 140s per verbal report by the ED provider over the phone. Patient is unable to provide any meaningful history at this time.    LKW: Unclear-patient unable to provide.  Based on my conversation with the ED provider, 1 to 2 days ago. tpa given?: no, ICH Premorbid modified Rankin scale (mRS): Unable to ascertain-patient unable to provide any history due to his mentation  ROS: Unable to obtain due to altered mental status  Past Medical History:  Diagnosis Date  . Alcohol abuse   . Hypertension     History reviewed. No pertinent family history. Patient unable to provide due to his mentation  Social History:   reports that he quit smoking about 2 months ago. His smoking use included cigarettes. He has a 25.00 pack-year smoking history. He has never used smokeless tobacco. He reports current alcohol use of about 12.0 standard drinks of alcohol per week. He reports previous drug use. Drug:  Marijuana.  Patient unable to confirm.  Medications  Current Facility-Administered Medications:  .  clevidipine (CLEVIPREX) 0.5 MG/ML infusion, , , ,  .   stroke: mapping our early stages of recovery book, , Does not apply, Once, Milon Dikes, MD .  acetaminophen (TYLENOL) tablet 650 mg, 650 mg, Oral, Q4H PRN **OR** acetaminophen (TYLENOL) 160 MG/5ML solution 650 mg, 650 mg, Per Tube, Q4H PRN **OR** acetaminophen (TYLENOL) suppository 650 mg, 650 mg, Rectal, Q4H PRN, Milon Dikes, MD .  labetalol (NORMODYNE) injection 20 mg, 20 mg, Intravenous, Once **AND** clevidipine (CLEVIPREX) infusion 0.5 mg/mL, 0-21 mg/hr, Intravenous, Continuous, Milon Dikes, MD .  LORazepam (ATIVAN) injection 0-4 mg, 0-4 mg, Intravenous, Q6H, 2 mg at 02/24/20 0039 **OR** LORazepam (ATIVAN) tablet 0-4 mg, 0-4 mg, Oral, Q6H, Terrilee Files, MD .  Melene Muller ON 02/26/2020] LORazepam (ATIVAN) injection 0-4 mg, 0-4 mg, Intravenous, Q12H **OR** [START ON 02/26/2020] LORazepam (ATIVAN) tablet 0-4 mg, 0-4 mg, Oral, Q12H, Terrilee Files, MD .  pantoprazole (PROTONIX) injection 40 mg, 40 mg, Intravenous, QHS, Milon Dikes, MD .  senna-docusate (Senokot-S) tablet 1 tablet, 1 tablet, Oral, BID, Milon Dikes, MD .  thiamine tablet 100 mg, 100 mg, Oral, Daily **OR** thiamine (B-1) injection 100 mg, 100 mg, Intravenous, Daily, Terrilee Files, MD, 100 mg at 02/23/20 1919 No current outpatient medications on file.  Exam: Current vital signs: BP 115/79   Pulse 94   Resp 18   Ht 5\' 11"  (1.803 m)   Wt 72.6 kg  SpO2 99%   BMI 22.32 kg/m  Vital signs in last 24 hours: Pulse Rate:  [56-116] 94 (07/28 0112) Resp:  [17-24] 18 (07/28 0112) BP: (90-156)/(70-109) 115/79 (07/28 0112) SpO2:  [90 %-100 %] 99 % (07/28 0112) Weight:  [72.6 kg] 72.6 kg (07/27 1911) General: Awake alert combative, whole body tremulousness appears to be in delirium tremens HEENT: Normocephalic atraumatic Lungs: Scattered rales, making gurgling  sounds CVS: Regular rate rhythm Extremities warm well perfused.  Left elbow tender and swollen. Neurological exam Is awake, alert. He mumbles his name. Significantly dysarthric Significant amount of tremulousness of the whole body Is able to tell me his name, and age. Unable to tell me the date Poor attention concentration Cranial nerves: Pupils equal round reactive sluggishly to light, seems to be preferring the right side with his gaze but is able to look and attend to the examiner when I stood on his left as well but definitely has rightward gaze preference, blinks to the threat from both sides, subtle left nasolabial fold flattening. Motor exam: Moving all 4 extremities spontaneously but is definitively weaker on the left upper and lower extremity and very strong on the right upper and lower extremity to the point that the right upper and lower extremity had to be restrained with soft restraints so that he did not interfere in treatment. Sensory exam: Grimaces and withdraws to noxious stimulation in all fours-weaker on the left in comparison to the right Coordination difficult to assess-is in florid DTs.   Labs I have reviewed labs in epic and the results pertinent to this consultation are:  CBC    Component Value Date/Time   WBC 5.8 02/23/2020 2009   RBC 4.01 (L) 02/23/2020 2009   HGB 13.2 02/23/2020 2009   HCT 38.5 (L) 02/23/2020 2009   PLT 209 02/23/2020 2009   MCV 96.0 02/23/2020 2009   MCH 32.9 02/23/2020 2009   MCHC 34.3 02/23/2020 2009   RDW 14.4 02/23/2020 2009   LYMPHSABS 0.3 (L) 02/23/2020 2009   MONOABS 0.4 02/23/2020 2009   EOSABS 0.0 02/23/2020 2009   BASOSABS 0.0 02/23/2020 2009   CMP     Component Value Date/Time   NA 127 (L) 02/23/2020 2009   K 4.1 02/23/2020 2009   CL 88 (L) 02/23/2020 2009   CO2 26 02/23/2020 2009   GLUCOSE 102 (H) 02/23/2020 2009   BUN 7 02/23/2020 2009   CREATININE 0.53 (L) 02/23/2020 2009   CALCIUM 9.7 02/23/2020 2009   PROT  8.9 (H) 02/23/2020 2009   ALBUMIN 3.9 02/23/2020 2009   AST 80 (H) 02/23/2020 2009   ALT 46 (H) 02/23/2020 2009   ALKPHOS 234 (H) 02/23/2020 2009   BILITOT 1.7 (H) 02/23/2020 2009   GFRNONAA >60 02/23/2020 2009   GFRAA >60 02/23/2020 2009  Undetectable EtOH level Hyponatremia Deranged LFTs  Imaging I have reviewed the images obtained:  CT-scan of the brain-right basal ganglia hemorrhage with mild midline shift.  Assessment: 39 year old with extensive history of alcohol abuse presenting with weakness and difficulty walking with an unclear last known normal. Has alcohol level in the blood was undetectable On examination in the emergency room at [facility], had some left hemiparesis for which a CT head was done that showed a right basal ganglia bleed. On my examination, he does have left hemiparesis, rightward gaze preference and is in florid DTs. An underlying withdrawal seizure cannot be ruled out. At this point he is protecting his airway but has some gurgly sounds in  his airway  Impression: Right basal ganglia hemorrhage-likely hypertensive Alcohol withdrawal Delirium tremens Evaluate for underlying seizure  Plan: Subcortical ICH, nontraumatic  Acuity: Acute Laterality: Right basal ganglia Current suspected etiology: Under investigation-likely hypertensive Treatment: -Admit to neurological ICU -ICH Score: 1 due to GCS 12. -ICH Volume: Less than 30 cc -BP control goal SYS<140 -Repeat CT head-consider neurosurgical consultation if worsening shift or hydrocephalus is observed. -neuromonitoring  CNS Cerebral edema Compression of brain -Close neuro monitoring -Imaging as above and potential neurosurgical consultation if imaging is worse  Dysarthria Dysphagia following ICH  -NPO until cleared by speech -ST  Toxic encephalopathy -Correct metabolic causes -Monitor  Hemiplegia and hemiparesis following nontraumatic intracerebral hemorrhage affecting left  non-dominant side  -Continue PT/OT/ST  Alcohol withdrawal, delirium tremens, history of alcohol abuse -CIWA protocol -Precedex for sedation -High-dose thiamine-500 mg 3 times daily for 3 days followed by 250 mg daily for 5 days followed by regular supplementation -I would load him with Keppra: 2000 mg x 1. -EEG in the morning.  Would continue Keppra only if there is any evidence of electrographic abnormalities on EEG. Appreciate PCCM assistance   RESP Possible aspiration pneumonia -Chest x-ray -Appreciate PCCM consultation and recommendations  CV Hypertensive Encephalopathy Essential (primary) hypertension Hypertensive Emergency -Aggressive BP control, goal SBP <140 -Labetalol and Cleviprex as needed -Precedex should help as well  GI/GU Gentle hydration  Possible alcoholic liver disease -Check ammonia.  Treat with lactulose if elevated.  Hyperbilirubinemia -We will appreciate assistance from PCCM and further work-up and management as needed.  HEME No acute issues Check labs in the morning   ENDO Check A1c -goal HgbA1c < 7  Fluid/Electrolyte Disorders Hyponatremia-sodium 127. -Likely chronic related to alcohol abuse Gentle hydration with normal saline  ID Possible Aspiration PNA -CXR -NPO -Monitor Appreciate PCCM assistance  Prophylaxis DVT: SCD.  Avoid any antiplatelets or anticoagulation use due to the recent ICH GI: PPI Bowel: Docusate senna  Dispo: TBD  Diet: NPO until cleared by speech  Code Status: Full Code   THE FOLLOWING WERE PRESENT ON ADMISSION: ICH, cerebral edema, hemiplegia, hemiparesis, possible aspiration pneumonia, delirium tremens,  alcohol abuse, hyponatremia  Requested PCCM consultation to Dr. Violet Baldy. Also discussed my plan with the ED attending at [facility], Dr. Wilkie Aye.  I appreciate their assistance in managing this patient.  -- Milon Dikes, MD [facility] Pager: [phone] If 7pm to 7am, please  call on call as listed on AMION.   CRITICAL CARE ATTESTATION Performed by: Milon Dikes, MD Total critical care time: 55 minutes Critical care time was exclusive of separately billable procedures and treating other patients and/or supervising APPs/Residents/Students Critical care was necessary to treat or prevent imminent or life-threatening deterioration due to ICH, alcohol withdrawal, delirium tremens, possible aspiration pneumonia This patient is critically ill and at significant risk for neurological worsening and/or death and care requires constant monitoring. Critical care was time spent personally by me on the following activities: development of treatment plan with patient and/or surrogate as well as nursing, discussions with consultants, evaluation of patient's response to treatment, examination of patient, obtaining history from patient or surrogate, ordering and performing treatments and interventions, ordering and review of laboratory studies, ordering and review of radiographic studies, pulse oximetry, re-evaluation of patient's condition, participation in multidisciplinary rounds and medical decision making of high complexity in the care of this patient.

## 2020-02-24 NOTE — ED Provider Notes (Addendum)
° °  11:15 PM I observed patient with Dr. Charm Barges while he was getting his teleneurology consult.  Patient appears to have a left hemiparesis and left facial droop.  He seems to have some difficulty following some commands related to motor function of his right extremities however he is able to move those extremities although he appears weak.  Patient does know his age and was able to verbalize it.  Patient is waiting to be transferred to [facility] for admission to neurology.  Patient had a hemorrhagic right basal ganglia and hypertensive bleed.  He has some mild midline shift from right to left he has a 3 AM CT of the head ordered.   Nursing staff has come to me because Dr. Jerrell Belfast will not give them orders on the patient while in the ED.  12:43 AM I spoke to [place], Press photographer at [facility], ED.  She is excepting the patient in transfer.  12:45 PM I spoke to Dr. Wilkie Aye, ED physician who has accepted patient to be seen at [facility], ED.  12:46 AM Dr. Lilian Kapur, teleneurologist called me.  He states to keep the patient's blood pressure less than 160.  We discussed his blood pressures which are now in the 130 range however he has had a persistent diastolic hypertension in the 90s to 100 range.  He recommends to go ahead and give labetalol 10 mg IV.  He also states he needs a repeat head CT in 6 hours which would be about 3 AM.  1:05 AM patient's blood pressure is 115/79, heart rate 89 after getting Ativan based on his CIWA score.  That labetalol was not ordered.  1:30 AM [facility] is here to transport patient to [facility], ED.   Devoria Albe, MD 02/24/20 1950    Devoria Albe, MD 02/24/20 [phone]

## 2020-02-25 DIAGNOSIS — I161 Hypertensive emergency: Secondary | ICD-10-CM

## 2020-02-25 DIAGNOSIS — I612 Nontraumatic intracerebral hemorrhage in hemisphere, unspecified: Secondary | ICD-10-CM

## 2020-02-25 DIAGNOSIS — E871 Hypo-osmolality and hyponatremia: Secondary | ICD-10-CM

## 2020-02-25 LAB — CBC
HCT: 35.7 % — ABNORMAL LOW (ref 39.0–52.0)
Hemoglobin: 12.1 g/dL — ABNORMAL LOW (ref 13.0–17.0)
MCH: 33.1 pg (ref 26.0–34.0)
MCHC: 33.9 g/dL (ref 30.0–36.0)
MCV: 97.5 fL (ref 80.0–100.0)
Platelets: 194 10*3/uL (ref 150–400)
RBC: 3.66 MIL/uL — ABNORMAL LOW (ref 4.22–5.81)
RDW: 14.1 % (ref 11.5–15.5)
WBC: 7.6 10*3/uL (ref 4.0–10.5)
nRBC: 0 % (ref 0.0–0.2)

## 2020-02-25 LAB — BASIC METABOLIC PANEL
Anion gap: 15 (ref 5–15)
BUN: 8 mg/dL (ref 6–20)
CO2: 21 mmol/L — ABNORMAL LOW (ref 22–32)
Calcium: 9.3 mg/dL (ref 8.9–10.3)
Chloride: 96 mmol/L — ABNORMAL LOW (ref 98–111)
Creatinine, Ser: 0.67 mg/dL (ref 0.61–1.24)
GFR calc Af Amer: >60 mL/min (ref >60)
GFR calc non Af Amer: >60 mL/min (ref >60)
Glucose, Bld: 81 mg/dL (ref 70–99)
Potassium: 3.5 mmol/L (ref 3.5–5.1)
Sodium: 132 mmol/L — ABNORMAL LOW (ref 135–145)

## 2020-02-25 LAB — TRIGLYCERIDES: Triglycerides: 69 mg/dL (ref <150)

## 2020-02-25 LAB — GLUCOSE, CAPILLARY
Glucose-Capillary: 109 mg/dL — ABNORMAL HIGH (ref 70–99)
Glucose-Capillary: 101 mg/dL — ABNORMAL HIGH (ref 70–99)

## 2020-02-25 LAB — HEMOGLOBIN A1C
Hgb A1c MFr Bld: 5.1 % (ref 4.8–5.6)
Mean Plasma Glucose: 99.67 mg/dL

## 2020-02-25 MED ORDER — FOLIC ACID 5 MG/ML IJ SOLN
1.0000 mg | Freq: Every day | INTRAMUSCULAR | Status: DC
  Administered 2020-02-25 – 2020-03-05 (×10): 1 mg via INTRAVENOUS
  Filled 2020-02-25 (×12): qty 0.2

## 2020-02-25 MED ORDER — LACTATED RINGERS IV SOLN: INTRAVENOUS | Status: DC

## 2020-02-25 MED ORDER — MAGNESIUM SULFATE 2 GM/50ML IV SOLN
2.0000 g | Freq: Once | INTRAVENOUS | Status: AC
  Administered 2020-02-25: 2 g via INTRAVENOUS
  Filled 2020-02-25: qty 50

## 2020-02-25 MED ORDER — POTASSIUM PHOSPHATES 15 MMOLE/5ML IV SOLN
30.0000 mmol | Freq: Once | INTRAVENOUS | Status: AC
  Administered 2020-02-25: 30 mmol via INTRAVENOUS
  Filled 2020-02-25: qty 10

## 2020-02-25 NOTE — Progress Notes (Signed)
NAME:  Tommy Garcia, MRN:  [id number], DOB:  03/25/1981, LOS: 2 ADMISSION DATE:  02/23/2020, CONSULTATION DATE:  02/24/20 REFERRING MD:  Wilford Corner  CHIEF COMPLAINT:  DT's   Brief History   Tommy Garcia is a 39 y.o. male who was transferred from [facility], admitted 7/27 with BG ICH, hypertensive emergency and DT's.  PCCM asked to assist with management of DT's.   Past Medical History  has ABSCESS, TOOTH; Hypertension; ICH (intracerebral hemorrhage) (HCC); Alcohol withdrawal syndrome with complication (HCC); and DTs (delirium tremens) (HCC) on their problem list.  Significant Hospital Events   7/27 > transferred from [facility].  Consults:  Neurosurgery, PCCM.  Procedures:  None.  Significant Diagnostic Tests:  CT head 7/27 > right BG hemorrhage. CTA head 7/27 > neg. CT head 7/28 > Size stable 16 cc hematoma at the right basal ganglia. EEG 7/28 > no seizure, consistent with encephalopathy  Micro Data:  COVID 7/27 > neg.  Antimicrobials:  None.   Interim history/subjective:  Sleeping in ED bed. Difficult to arouse. Awaiting ICU bed. Remains on precedex, cleviprex  Objective:  Blood pressure 124/84, pulse 67, temperature (!) 101 F (38.3 C), temperature source Axillary, resp. rate 22, height 5\' 11"  (1.803 m), weight 73 kg, SpO2 100 %.        Intake/Output Summary (Last 24 hours) at 02/25/2020 0825 Last data filed at 02/25/2020 0700 Gross per 24 hour  Intake 2771.71 ml  Output 975 ml  Net 1796.71 ml   Filed Weights   02/23/20 1911 02/24/20 1500  Weight: 72.6 kg 73 kg    Examination: General: Middle-aged Caucasian male, lying in the bed Neuro: Lethargic, opens eyes with vocal stimuli, confused and dysarthric.  Strength 2/5 in left upper extremity, 4/5 in left lower extremity, 5/5 on the right side HEENT: [place]/AT, PERRL, no JVD Cardiovascular: Regular rate and rhythm, no murmur or gallop Lungs: Clear to auscultation bilaterally, no wheezes Abdomen: BS x 4, soft, NT/ND.    Musculoskeletal: No gross deformities, no edema.  Skin: Intact, warm, no rashes.  Assessment & Plan:  Intraparenchymal right basal ganglia hemorrhage due to uncontrolled hypertension Hypertensive emergency Alcohol withdrawal, complicated with delirium tremens Hyponatremia/hypokalemia Acute alcoholic hepatitis  Patient's repeat CT scan shows stability of right basal ganglia bleed His ICH score is 0 Systolic blood pressure goal is less than 160, currently requiring Cleviprex to maintain tight blood pressure, will adjust Cleviprex dose We will evaluate to restart oral antihypertensive once his mental status improves Patient was loaded with phenobarbital, he is on Ativan protocol for alcohol withdrawal Also requiring Precedex infusion to prevent agitation and treatment of delirium tremens Continue high-dose thiamine and folate Electrolytes are being supplemented, closely monitor N.p.o. Continue IV fluid Recheck LFTs in the morning  Best Practice:  Diet: NPO. Pain/Anxiety/Delirium protocol (if indicated): Precedex per protocol VAP protocol (if indicated): N/A. DVT prophylaxis: Per primary. GI prophylaxis: N/A. Glucose control: N/A. Mobility: Bedrest. Code Status: Full. Family Communication: Per primary. Disposition: ICU.  Labs   CBC: Recent Labs  Lab 02/23/20 2009 02/24/20 0325 02/25/20 0243  WBC 5.8 5.0 7.6  NEUTROABS 4.9  --   --   HGB 13.2 13.0 12.1*  HCT 38.5* 38.7* 35.7*  MCV 96.0 96.0 97.5  PLT 209 222 194   Basic Metabolic Panel: Recent Labs  Lab 02/23/20 2009 02/24/20 0325 02/25/20 0243  NA 127* 130* 132*  K 4.1 3.8 3.5  CL 88* 91* 96*  CO2 26 26 21*  GLUCOSE 102* 94 81  BUN 7 5* 8  CREATININE 0.53* 0.64 0.67  CALCIUM 9.7 9.9 9.3  MG  --  1.7  --   PHOS  --  3.9  --    GFR: Estimated Creatinine Clearance: 126.7 mL/min (by C-G formula based on SCr of 0.67 mg/dL). Recent Labs  Lab 02/23/20 2009 02/24/20 0325 02/25/20 0243  WBC 5.8 5.0 7.6    Liver Function Tests: Recent Labs  Lab 02/23/20 2009 02/24/20 0325  AST 80* 73*  ALT 46* 42  ALKPHOS 234* 211*  BILITOT 1.7* 1.7*  PROT 8.9* 8.5*  ALBUMIN 3.9 3.5   No results for input(s): LIPASE, AMYLASE in the last 168 hours. Recent Labs  Lab 02/24/20 0327  AMMONIA 40*   ABG No results found for: PHART, PCO2ART, PO2ART, HCO3, TCO2, ACIDBASEDEF, O2SAT  Coagulation Profile: Recent Labs  Lab 02/23/20 2009  INR 1.1   Cardiac Enzymes: Recent Labs  Lab 02/23/20 2009  CKTOTAL 282   HbA1C: No results found for: HGBA1C CBG: No results for input(s): GLUCAP in the last 168 hours.    Total critical care time: 34 minutes  Performed by: Cheri Fowler   Critical care time was exclusive of separately billable procedures and treating other patients.   Critical care was necessary to treat or prevent imminent or life-threatening deterioration.   Critical care was time spent personally by me on the following activities: development of treatment plan with patient and/or surrogate as well as nursing, discussions with consultants, evaluation of patient's response to treatment, examination of patient, obtaining history from patient or surrogate, ordering and performing treatments and interventions, ordering and review of laboratory studies, ordering and review of radiographic studies, pulse oximetry and re-evaluation of patient's condition.   Cheri Fowler MD Critical care physician [facility]  Pager: [phone] Mobile: [phone]

## 2020-02-25 NOTE — Progress Notes (Signed)
SLP Cancellation Note  Patient Details Name: Tommy Garcia MRN: [id number] DOB: [birth year]   Cancelled treatment:       Reason Eval/Treat Not Completed: Fatigue/lethargy limiting ability to participate; pt somnolent.  Will continue efforts.  Tommy Maye L. Samson Frederic, MA CCC/SLP Acute Rehabilitation Services Office number [phone] Pager [phone]    Tommy Garcia 02/25/2020, 10:11 AM

## 2020-02-25 NOTE — Progress Notes (Signed)
OT Cancellation Note  Patient Details Name: Tommy Garcia MRN: [id number] DOB: 1980-09-26   Cancelled Treatment:    Reason Eval/Treat Not Completed: Active bedrest order.  Pt having DTs and is sedated.   [name]., OTR/L Acute Rehabilitation Services Pager [phone] Office [phone]   [name] M 02/25/2020, 12:01 PM

## 2020-02-25 NOTE — Progress Notes (Signed)
STROKE TEAM PROGRESS NOTE   INTERVAL HISTORY His RN is at the bedside.  Pt lying in bed, drowsy sleepy with intermittent snoring but still protecting airway. Not able to pass swallow. Still on precedex and left hemiparesis. Able to tell his first name and age but perseverated on age for any other questions.    Vitals:   02/25/20 0900 02/25/20 0930 02/25/20 1000 02/25/20 1030  BP: 126/80 (!) 122/86 108/71 (!) 135/95  Pulse: 64 62 82 59  Resp: _0 Temp:      TempSrc:      SpO2:      Weight:      Height:       CBC:  Recent Labs  Lab 02/23/20 2009 02/23/20 2009 02/24/20 0325 02/25/20 0243  WBC 5.8   < > 5.0 7.6  NEUTROABS 4.9  --   --   --   HGB 13.2   < > 13.0 12.1*  HCT 38.5*   < > 38.7* 35.7*  MCV 96.0   < > 96.0 97.5  PLT 209   < > 222 194   < > = values in this interval not displayed.   Basic Metabolic Panel:  Recent Labs  Lab 02/24/20 0325 02/25/20 0243  NA 130* 132*  K 3.8 3.5  CL 91* 96*  CO2 26 21*  GLUCOSE 94 81  BUN 5* 8  CREATININE 0.64 0.67  CALCIUM 9.9 9.3  MG 1.7  --   PHOS 3.9  --    Lipid Panel:  Recent Labs  Lab 02/25/20 0243  TRIG 69   HgbA1c: No results for input(s): HGBA1C in the last 168 hours. Urine Drug Screen:  Recent Labs  Lab 02/24/20 1839  LABOPIA NONE DETECTED  COCAINSCRNUR NONE DETECTED  LABBENZ POSITIVE*  AMPHETMU NONE DETECTED  THCU NONE DETECTED  LABBARB POSITIVE*    Alcohol Level  Recent Labs  Lab 02/23/20 2009  ETH <10    IMAGING past 24 hours EEG  Result Date: 02/24/2020 Lora Havens, MD     02/24/2020  6:56 PM Patient Name: JAMANI BEARCE MRN: [id number] Epilepsy Attending: Lora Havens Referring Physician/Provider: Dr Amie [place] Date: 02/24/2020 Duration: 23.32 mins Patient history: 39yo M with right basal ganglia ICH. EEG to evaluate for seizure. Level of alertness: Awake, asleep AEDs during EEG study: ativan Technical aspects: This EEG study was done with scalp electrodes positioned  according to the 10-20 International system of electrode placement. Electrical activity was acquired at a sampling rate of _1  and reviewed with a high frequency filter of _2  and a low frequency filter of _3 . EEG data were recorded continuously and digitally stored. Description: No posterior dominant. Sleep was characterized by vertex waves, sleep spindles (12 to 14 Hz), maximal frontocentral region.  EEG showed continuous generalized 3-_4  theta-delta slowing admixed with 15 to 18 Hz beta activity distributed symmetrically and diffusely. One episode of right arm twitching was recorded at 1631. Concomitant eeg before, during and after the event didnt show any eeg change. Hyperventilation and photic stimulation were not performed.   ABNORMALITY - Continuous slow, generalized - Excessive beta, generalized IMPRESSION: This study is suggestive of moderate diffuse encephalopathy, nonspecific etiology. The excessive beta activity seen in the background is most likely due to the effect of benzodiazepine and is a benign EEG pattern. One episode of right arm twitching was noted at 1631 without concomitant eeg change and was likely not epileptic. Of note, focal motor seizures may not  be seen on scalp eeg. Therefore, clinical correlation is recommended. No epileptiform discharges were seen throughout the recording. Priyanka Barbra Sarks    PHYSICAL EXAM  Temp:  [98.6 F (37 C)-101 F (38.3 C)] 99.3 F (37.4 C) (07/29 0800) Pulse Rate:  [59-104] 59 (07/29 1030) Resp:  [16-35] 22 (07/29 1030) BP: (94-161)/(54-99) 135/95 (07/29 1030) SpO2:  [96 %-100 %] 100 % (07/29 0800) Weight:  [73 kg] 73 kg (07/28 1500)  General - Well nourished, well developed, drowsy sleepy, intermittent snoring.  Ophthalmologic - fundi not visualized due to noncooperation.  Cardiovascular - Regular rhythm and rate.  Neuro - drowsy sleepy with intermittent snoring. Able to briefly open eyes with repetitive stimulation. Able to tell me  his first name and age but perseverated on other questions . Very limited verbal output, severe dysarthria, follow midline commands but not peripheral commands. Not cooperative on visual field testing, not consistently blinking eyes for visual threat, PERRL, EOMI. Left facial droop. Tongue midline. LUE 2+/5 and LLE withdraw 2-/5 on pain and able to hold at knee flexion and foot on bed position. RUE against gravity at least, RLE 3/5 able to hold at knee flexion and foot on bed position. Sensation, coordination and gait not tested.   ASSESSMENT/PLAN Mr. PARMVIR BOOMER is a 39 y.o. male with history of Etoh abuse, HTN presenting to [facility] with L sided weakness, R gaze preference in DTs.   Stroke: R basal ganglia ICH likely secondary to HTN   CT head R basal ganglia hemorrhage w/ R->L midline shift   CTA head & neck Unremarkable   Repeat CT head stable hemorrhage   CT repeat in am  2D Echo EF 50-55%. No source of embolus   EEG excessive beta, generalized slowing  LDL pending   HgbA1c 5.1  UDS positive for benzo and barbiturates  VTE prophylaxis - SCDs   No antithrombotic prior to admission, now on No antithrombotic given hemorrhage   Therapy recommendations:  pending   Disposition:  pending   Hypertensive emergency  Home meds:  None listed  On cleviprex . BP stable  . SBP goal < 160 . Long-term BP goal normotensive  ETOH withdraw Delirium Tremens   Ativan for DTs  CIWA protocol  alcohol level <10  On precedex  Thiamine/folate/MVI  advised to drink no more than 2 drink(s) a day  As per mom, pt drinks 12 beers average per day  Tachypnea   Likely related to alcohol withdraw and sedation  O2 sat WNL  CCM on board  Improved  Dysphagia   Did not pass swallow  NPO now  cortrak in am  On IVF  CBG monitoring  Tobacco abuse  Quit smoking 2 mos ago  Smoking cessation counseling will be provided to continue cessation   Other Stroke  Risk Factors  Substance abuse  - UDS + benzo + barbituate  Other Active Problems  Acute alcohol hepatitis, transaminitis AST/ALT 80/46->73/42->pending, Alk Phos 234->211->pending   Hyponatremia, Na 127-130-132  Hospital day # 2  This patient is critically ill due to BG hemorrhage, alcohol withdrawal with DT, dysphagia, hypertensive emergency, respiratory distress and at significant risk of neurological worsening, death form hematoma expansion, cerebral edema, brain herniation, DT, hypertensive encephalopathy. This patient's care requires constant monitoring of vital signs, hemodynamics, respiratory and cardiac monitoring, review of multiple databases, neurological assessment, discussion with family, other specialists and medical decision making of high complexity. I spent 35 minutes of neurocritical care time in the care  of this patient.    Rosalin Hawking, MD PhD Stroke Neurology 02/25/2020 10:44 AM   To contact Stroke Continuity provider, please refer to [email or web address] After hours, contact General Neurology

## 2020-02-25 NOTE — Progress Notes (Signed)
PT Cancellation Note  Patient Details Name: Tommy Garcia MRN: [id number] DOB: 05-May-1981   Cancelled Treatment:    Reason Eval/Treat Not Completed: Medical issues which prohibited therapy; per OT note pt having DT's and sedated.  Will attempt another day.   Elray Mcgregor 02/25/2020, 3:12 PM  Sheran Lawless, PT Acute Rehabilitation Services Pager:[phone] Office:[phone] 02/25/2020

## 2020-02-26 ENCOUNTER — Inpatient Hospital Stay (HOSPITAL_COMMUNITY): Payer: Self-pay

## 2020-02-26 DIAGNOSIS — I610 Nontraumatic intracerebral hemorrhage in hemisphere, subcortical: Principal | ICD-10-CM

## 2020-02-26 DIAGNOSIS — E876 Hypokalemia: Secondary | ICD-10-CM

## 2020-02-26 DIAGNOSIS — E440 Moderate protein-calorie malnutrition: Secondary | ICD-10-CM | POA: Insufficient documentation

## 2020-02-26 LAB — HEPATIC FUNCTION PANEL
ALT: 32 U/L (ref 0–44)
AST: 46 U/L — ABNORMAL HIGH (ref 15–41)
Albumin: 2.8 g/dL — ABNORMAL LOW (ref 3.5–5.0)
Alkaline Phosphatase: 147 U/L — ABNORMAL HIGH (ref 38–126)
Bilirubin, Direct: 0.4 mg/dL — ABNORMAL HIGH (ref 0.0–0.2)
Indirect Bilirubin: 1.6 mg/dL — ABNORMAL HIGH (ref 0.3–0.9)
Total Bilirubin: 2 mg/dL — ABNORMAL HIGH (ref 0.3–1.2)
Total Protein: 7.5 g/dL (ref 6.5–8.1)

## 2020-02-26 LAB — CBC
HCT: 34 % — ABNORMAL LOW (ref 39.0–52.0)
Hemoglobin: 11.3 g/dL — ABNORMAL LOW (ref 13.0–17.0)
MCH: 32.1 pg (ref 26.0–34.0)
MCHC: 33.2 g/dL (ref 30.0–36.0)
MCV: 96.6 fL (ref 80.0–100.0)
Platelets: 196 10*3/uL (ref 150–400)
RBC: 3.52 MIL/uL — ABNORMAL LOW (ref 4.22–5.81)
RDW: 13.6 % (ref 11.5–15.5)
WBC: 5.2 10*3/uL (ref 4.0–10.5)
nRBC: 0 % (ref 0.0–0.2)

## 2020-02-26 LAB — BASIC METABOLIC PANEL
Anion gap: 13 (ref 5–15)
BUN: 7 mg/dL (ref 6–20)
CO2: 23 mmol/L (ref 22–32)
Calcium: 9.2 mg/dL (ref 8.9–10.3)
Chloride: 93 mmol/L — ABNORMAL LOW (ref 98–111)
Creatinine, Ser: 0.53 mg/dL — ABNORMAL LOW (ref 0.61–1.24)
GFR calc Af Amer: >60 mL/min (ref >60)
GFR calc non Af Amer: >60 mL/min (ref >60)
Glucose, Bld: 91 mg/dL (ref 70–99)
Potassium: 3.2 mmol/L — ABNORMAL LOW (ref 3.5–5.1)
Sodium: 129 mmol/L — ABNORMAL LOW (ref 135–145)

## 2020-02-26 LAB — MAGNESIUM: Magnesium: 1.6 mg/dL — ABNORMAL LOW (ref 1.7–2.4)

## 2020-02-26 LAB — LIPID PANEL
Cholesterol: 140 mg/dL (ref 0–200)
HDL: 53 mg/dL (ref >40)
LDL Cholesterol: 71 mg/dL (ref 0–99)
Total CHOL/HDL Ratio: 2.6 RATIO
Triglycerides: 78 mg/dL (ref <150)
VLDL: 16 mg/dL (ref 0–40)

## 2020-02-26 LAB — GLUCOSE, CAPILLARY
Glucose-Capillary: 97 mg/dL (ref 70–99)
Glucose-Capillary: 102 mg/dL — ABNORMAL HIGH (ref 70–99)
Glucose-Capillary: 95 mg/dL (ref 70–99)
Glucose-Capillary: 103 mg/dL — ABNORMAL HIGH (ref 70–99)

## 2020-02-26 LAB — PHOSPHORUS: Phosphorus: 3 mg/dL (ref 2.5–4.6)

## 2020-02-26 MED ORDER — PROSOURCE TF PO LIQD
45.0000 mL | Freq: Three times a day (TID) | ORAL | Status: DC
  Administered 2020-02-26 – 2020-03-05 (×24): 45 mL
  Filled 2020-02-26 (×32): qty 45

## 2020-02-26 MED ORDER — SODIUM CHLORIDE 0.9 % IV SOLN
INTRAVENOUS | Status: DC | PRN
  Administered 2020-02-26: 500 mL via INTRAVENOUS

## 2020-02-26 MED ORDER — POTASSIUM PHOSPHATES 15 MMOLE/5ML IV SOLN
30.0000 mmol | Freq: Once | INTRAVENOUS | Status: AC
  Administered 2020-02-26: 30 mmol via INTRAVENOUS
  Filled 2020-02-26: qty 10

## 2020-02-26 MED ORDER — AMLODIPINE BESYLATE 10 MG PO TABS
10.0000 mg | ORAL_TABLET | Freq: Every day | ORAL | Status: DC
  Administered 2020-02-27 – 2020-03-05 (×8): 10 mg
  Filled 2020-02-26 (×8): qty 1

## 2020-02-26 MED ORDER — LISINOPRIL 20 MG PO TABS: 20.0000 mg | ORAL_TABLET | Freq: Every day | ORAL | Status: DC

## 2020-02-26 MED ORDER — LISINOPRIL 20 MG PO TABS
20.0000 mg | ORAL_TABLET | Freq: Every day | ORAL | Status: DC
  Administered 2020-02-27: 20 mg
  Filled 2020-02-26: qty 1

## 2020-02-26 MED ORDER — ADULT MULTIVITAMIN W/MINERALS CH
1.0000 | ORAL_TABLET | Freq: Every day | ORAL | Status: DC
  Administered 2020-02-27 – 2020-03-05 (×8): 1
  Filled 2020-02-26 (×8): qty 1

## 2020-02-26 MED ORDER — AMLODIPINE BESYLATE 10 MG PO TABS: 10.0000 mg | ORAL_TABLET | Freq: Every day | ORAL | Status: DC

## 2020-02-26 MED ORDER — HYDRALAZINE HCL 20 MG/ML IJ SOLN
10.0000 mg | Freq: Four times a day (QID) | INTRAMUSCULAR | Status: DC | PRN
  Administered 2020-02-26 – 2020-02-28 (×4): 10 mg via INTRAVENOUS
  Filled 2020-02-26 (×4): qty 1

## 2020-02-26 MED ORDER — HEPARIN SODIUM (PORCINE) 5000 UNIT/ML IJ SOLN
5000.0000 [IU] | Freq: Three times a day (TID) | INTRAMUSCULAR | Status: DC
  Administered 2020-02-26 – 2020-03-10 (×39): 5000 [IU] via SUBCUTANEOUS
  Filled 2020-02-26 (×39): qty 1

## 2020-02-26 MED ORDER — OSMOLITE 1.5 CAL PO LIQD
1000.0000 mL | ORAL | Status: DC
  Administered 2020-02-26 – 2020-03-05 (×9): 1000 mL
  Filled 2020-02-26 (×15): qty 1000

## 2020-02-26 MED ORDER — MAGNESIUM SULFATE 2 GM/50ML IV SOLN
2.0000 g | Freq: Once | INTRAVENOUS | Status: AC
  Administered 2020-02-26: 2 g via INTRAVENOUS
  Filled 2020-02-26: qty 50

## 2020-02-26 NOTE — Evaluation (Signed)
Clinical/Bedside Swallow Evaluation Patient Details  Name: Tommy Garcia MRN: [id number] Date of Birth: 09/07/1980  Today's Date: 02/26/2020 Time: SLP Start Time (ACUTE ONLY): 1350 SLP Stop Time (ACUTE ONLY): 1400 SLP Time Calculation (min) (ACUTE ONLY): 10 min  Past Medical History:  Past Medical History:  Diagnosis Date  . Alcohol abuse   . Hypertension    Past Surgical History:  Past Surgical History:  Procedure Laterality Date  . HAND TENDON SURGERY Right   . OPEN REDUCTION INTERNAL FIXATION (ORIF) DISTAL RADIAL FRACTURE Left 12/26/2017   Procedure: OPEN REDUCTION INTERNAL FIXATION (ORIF) DISTAL RADIAL FRACTURE,;  Surgeon: Betha Loa, MD;  Location: [facility];  Service: Orthopedics;  Laterality: Left;  . WRIST SURGERY Left around 1995   HPI:  39 y.o. male with history of Etoh abuse, HTN presenting to [facility] with L sided weakness, R gaze preference in DTs.  Transferred to [facility]; dx right basal ganglia ICH likely secondary to HTN.   Assessment / Plan / Recommendation Clinical Impression  Pt presents with s/s of a significant neurogenic dysphagia - there are focal CN deficits on left V, VII, and XII; poor labial seal and tongue control with spillage of water/ice chips from left side.  Pt presented with explosive coughing after all trials of ice chips/water, indicative of aspiration.  Recommend that he remain NPO for now; proceed with cortrak.  SLP will follow for PO readiness, instrumental swallow study as warranted, and speech/language evaluation when he is more alert. D/W pt's mother at bedside and RN.  Will follow.  SLP Visit Diagnosis: Dysphagia, oropharyngeal phase (R13.12)    Aspiration Risk       Diet Recommendation   npo with cortrak  Medication Administration: Via alternative means    Other  Recommendations Oral Care Recommendations: Oral care QID   Follow up Recommendations Other (comment) (tba)      Frequency and Duration min 3x week   2 weeks       Prognosis Prognosis for Safe Diet Advancement: Good      Swallow Study   General Date of Onset: 02/23/20 HPI: 39 y.o. male with history of Etoh abuse, HTN presenting to [facility] with L sided weakness, R gaze preference in DTs.  Transferred to [facility]; dx right basal ganglia ICH likely secondary to HTN. Type of Study: Bedside Swallow Evaluation Previous Swallow Assessment: no Diet Prior to this Study: NPO Temperature Spikes Noted: No Respiratory Status: Room air History of Recent Intubation: No Behavior/Cognition: Lethargic/Drowsy Oral Care Completed by SLP: Recent completion by staff Self-Feeding Abilities: Total assist Patient Positioning: Upright in bed Baseline Vocal Quality: Low vocal intensity Volitional Cough: Cognitively unable to elicit Volitional Swallow: Able to elicit    Oral/Motor/Sensory Function Overall Oral Motor/Sensory Function: Moderate impairment Facial ROM: Reduced left;Suspected CN VII (facial) dysfunction Facial Symmetry: Abnormal symmetry left;Suspected CN VII (facial) dysfunction Lingual ROM: Reduced left;Suspected CN XII (hypoglossal) dysfunction Lingual Symmetry: Abnormal symmetry left;Suspected CN XII (hypoglossal) dysfunction   Ice Chips Ice chips: Impaired Presentation: Spoon Oral Phase Impairments: Reduced labial seal;Reduced lingual movement/coordination Oral Phase Functional Implications: Left anterior spillage;Prolonged oral transit Pharyngeal Phase Impairments: Multiple swallows;Cough - Immediate   Thin Liquid Thin Liquid: Impaired Presentation: Spoon Oral Phase Impairments: Reduced labial seal Oral Phase Functional Implications: Left anterior spillage Pharyngeal  Phase Impairments: Wet Vocal Quality;Cough - Immediate    Nectar Thick Nectar Thick Liquid: Not tested   Honey Thick Honey Thick Liquid: Not tested   Puree Puree: Not tested  Solid     Solid: Not tested      Blenda Mounts Laurice 02/26/2020,2:05  PM    Marchelle Folks L. Samson Frederic, MA CCC/SLP Acute Rehabilitation Services Office number [phone] Pager [phone]

## 2020-02-26 NOTE — Progress Notes (Signed)
Initial Nutrition Assessment  DOCUMENTATION CODES:   Non-severe (moderate) malnutrition in context of chronic illness  INTERVENTION:   Initiate tube feeding via Cortrak (tip gastri): Osmolite 1.5 at 30 ml/h and increase by 10 ml every 12 hours to goal rate of 60 ml/h (1440 ml per day) Prosource TF 45 ml TID  Provides 2280 kcal, 123 gm protein, 1097 ml free water daily  Monitor magnesium and phosphorus daily every 12 hours x 4 occurances, MD to replete as needed, as pt is at risk for refeeding syndrome given moderate malnutrition.   NUTRITION DIAGNOSIS:   Moderate Malnutrition related to chronic illness (ETOH abuse) as evidenced by moderate muscle depletion, moderate fat depletion.  GOAL:   Patient will meet greater than or equal to 90% of their needs  MONITOR:   TF tolerance, Diet advancement  REASON FOR ASSESSMENT:   Rounds    ASSESSMENT:   Pt with PMH of HTN, and ETOH abuse admitted with hypertensive R basal ganglia intraparenchymal hemorrhage and delirium tremens.   Pt discussed during ICU rounds and with RN.  Off Cleviprex Failed swallow eval   Spoke with pt who was difficult to understand. He does indicate that he has lost weight and that he used to drink ETOH heavily but quit a couple of weeks ago.  Mom at bedside but unable to provide any history.    Medications reviewed and include: folic acid, MVI with minerals, senokot-s K-phos x 1 500 mg thiamine every 8 hours  Labs reviewed: Na 129, K+ 3.2, magnesium 1.6   NUTRITION - FOCUSED PHYSICAL EXAM:    Most Recent Value  Orbital Region No depletion  Upper Arm Region Moderate depletion  Thoracic and Lumbar Region Moderate depletion  Buccal Region No depletion  Temple Region No depletion  Clavicle Bone Region No depletion  Clavicle and Acromion Bone Region No depletion  Scapular Bone Region Unable to assess  Dorsal Hand Unable to assess  Patellar Region Moderate depletion  Anterior Thigh Region  Moderate depletion  Posterior Calf Region Mild depletion  Edema (RD Assessment) None  Hair Reviewed  Eyes Unable to assess  Mouth Unable to assess  Skin Reviewed  Nails Unable to assess       Diet Order:   Diet Order            Diet NPO time specified  Diet effective now                 EDUCATION NEEDS:   No education needs have been identified at this time  Skin:  Skin Assessment: Reviewed RN Assessment  Last BM:  unknown  Height:   Ht Readings from Last 1 Encounters:  02/24/20 5\' 11"  (1.803 m)    Weight:   Wt Readings from Last 1 Encounters:  02/24/20 73 kg    Ideal Body Weight:  78.1 kg  BMI:  Body mass index is 22.45 kg/m.  Estimated Nutritional Needs:   Kcal:  2200-2400  Protein:  110-120 grams  Fluid:  >2 L/day  02/26/20., RD, LDN, CNSC See AMiON for contact information

## 2020-02-26 NOTE — Consult Note (Signed)
Physical Medicine and Rehabilitation Consult Reason for Consult: Decreased functional ability with altered mental status left side weakness Referring Physician: Dr.Xu  HPI: Tommy Garcia is a 39 y.o. right-handed male with history of hypertension as well as alcohol/tobacco abuse.  Per chart review patient lives with his father.  His father works during the day.  Mother lives approximately 30 miles away.  Patient is currently unemployed.  Independent prior to admission.  Two-level home bed and bath main level 3 steps to entry.  Patient does have reported falls in the past.  Presented 02/24/2020 with left side weakness, tremors left-sided attention and altered mental status.  Cranial CT scan showed area of hemorrhage within the right basal ganglia likely related to hypertensive bleed.  Midline shift noted from right to left.  CT angiogram of head and neck with no abnormal vascularity or significant stenosis.  Echocardiogram with ejection fraction of 55% grade 1 diastolic dysfunction no regional wall motion abnormalities.  EEG was negative for seizure.  Admission chemistries alcohol negative, sodium 127, glucose 102, AST 80, ALT 46, alkaline phosphatase 234 and total bilirubin 1.7.  Maintained on Cleviprex for blood pressure control.  Neurology follow-up with conservative care of ICH with follow-up CT 02/26/2020 stable appearance of right basal ganglia hemorrhage with mild surrounding edema.  Stable 5 mm midline shift.  No new hemorrhage.  Patient is currently NPO.  Therapy evaluations completed with recommendations of physical medicine rehab consult.  Review of Systems  Constitutional: Negative for chills and fever.  HENT: Negative for hearing loss.   Eyes: Negative for blurred vision and double vision.  Respiratory: Negative for cough and shortness of breath.   Cardiovascular: Negative for chest pain, palpitations and leg swelling.  Gastrointestinal: Positive for constipation. Negative for  heartburn and vomiting.  Genitourinary: Negative for dysuria, flank pain and hematuria.  Musculoskeletal: Positive for falls and myalgias.  Skin: Negative for rash.  Neurological: Positive for dizziness, speech change, weakness and headaches.  All other systems reviewed and are negative.  Past Medical History:  Diagnosis Date  . Alcohol abuse   . Hypertension    Past Surgical History:  Procedure Laterality Date  . HAND TENDON SURGERY Right   . OPEN REDUCTION INTERNAL FIXATION (ORIF) DISTAL RADIAL FRACTURE Left 12/26/2017   Procedure: OPEN REDUCTION INTERNAL FIXATION (ORIF) DISTAL RADIAL FRACTURE,;  Surgeon: Betha Loa, MD;  Location: [facility];  Service: Orthopedics;  Laterality: Left;  . WRIST SURGERY Left around 1995   History reviewed. No pertinent family history. Social History:  reports that he quit smoking about 2 months ago. His smoking use included cigarettes. He has a 25.00 pack-year smoking history. He has never used smokeless tobacco. He reports current alcohol use of about 12.0 standard drinks of alcohol per week. He reports previous drug use. Drug: Marijuana. Allergies: No Known Allergies No medications prior to admission.    Home: Home Living Family/patient expects to be discharged to:: Private residence Living Arrangements: Parent Available Help at Discharge: Family Type of Home: House Home Access: Stairs to enter Secretary/administrator of Steps: 3 Home Layout: Able to live on main level with bedroom/bathroom, Laundry or work area in basement, Two level Bathroom Shower/Tub: Engineer, manufacturing systems: Standard Home Equipment: Grab bars - tub/shower Additional Comments: mother stopped OT in the hallway and reports that pts father cares for his 67+ y.o. 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, but also states has  fallen down the steps numerous time so that his bedroom is now on the main level Functional Status:  Mobility: Bed Mobility Overal bed mobility: Needs Assistance Bed Mobility: Supine to Sit, Sit to Supine Supine to sit: +2 for safety/equipment, HOB elevated, Max assist Sit to supine: Mod assist, +2 for physical assistance General bed mobility comments: assist for  L leg off bed and to lift trunk, patient initiating with R leg off bed Transfers Overall transfer level: Needs assistance Transfers: Sit to/from Stand Sit to Stand: +2 physical assistance, Mod assist General transfer comment: lifting assist of 2 to stand with increased time and cues, stood x 2 but would not maintain, wanting to lie back down      ADL: ADL Overall ADL's : Needs assistance/impaired Eating/Feeding: NPO Grooming: Wash/dry hands, Wash/dry face, Oral care, Brushing hair, Maximal assistance, Sitting Grooming Details (indicate cue type and reason): Pt require max A to wash face due to attentional deficits as well as incoordination  Upper Body Bathing: Maximal assistance, Sitting Lower Body Bathing: Total assistance, Sit to/from stand Upper Body Dressing : Total assistance, Sitting Lower Body Dressing: Total assistance, Sit to/from stand Lower Body Dressing Details (indicate cue type and reason): Pt attempted to don socks, but repeatedly lost balance EOB with no awareness, and repeatedly dropped his sock with no awareness  Toilet Transfer: Total assistance Toilet Transfer Details (indicate cue type and reason): unable  Toileting- Clothing Manipulation and Hygiene: Total assistance, Sit to/from stand Functional mobility during ADLs: Maximal assistance, +2 for physical assistance, +2 for safety/equipment  Cognition: Cognition Overall Cognitive Status: Impaired/Different from baseline Orientation Level: Oriented to place, Oriented to time, Oriented to situation, Disoriented to time Cognition Arousal/Alertness:  Lethargic Behavior During Therapy: Impulsive, Restless Overall Cognitive Status: Impaired/Different from baseline Area of Impairment: Orientation, Attention, Following commands, Safety/judgement, Problem solving, Awareness Orientation Level: Disoriented to, Place, Time Current Attention Level: Focused Following Commands: Follows one step commands inconsistently Safety/Judgement: Decreased awareness of safety, Decreased awareness of deficits Problem Solving: Slow processing, Difficulty sequencing, Requires verbal cues, Requires tactile cues General Comments: requires constant cues to attend to task.  He demonstrates poor awareness of deficits   Blood pressure (!) 127/100, pulse 75, temperature 98 F (36.7 C), temperature source Oral, resp. rate (!) 25, height 5\' 11"  (1.803 m), weight 73 kg, SpO2 100 %. Physical Exam General:  No apparent distress HEENT: Head is normocephalic, atraumatic, R gaze preference, oral mucosa pink and moist, dentition intact, ext ear canals clear,  Neck: Supple without JVD or lymphadenopathy Heart: Reg rate and rhythm. No murmurs rubs or gallops Chest: CTA bilaterally without wheezes, rales, or rhonchi; no distress Abdomen: Soft, non-tender, non-distended, bowel sounds positive. Extremities: No clubbing, cyanosis, or edema. Pulses are 2+ Skin: Clean and intact without signs of breakdown Patient is somewhat lethargic but arousable.  Left facial droop. Mother is at bedside.  Severely dysarthric but intelligible.  Provides his name and age as well as date of birth. Left side 2/5, right side 3/5 Psych: Pt's affect is appropriate. Pt is cooperative   Results for orders placed or performed during the hospital encounter of 02/23/20 (from the past 24 hour(s))  Glucose, capillary     Status: Abnormal   Collection Time: 02/25/20  7:11 PM  Result Value Ref Range   Glucose-Capillary 109 (H) 70 - 99 mg/dL  Glucose, capillary     Status: Abnormal   Collection Time: 02/25/20  11:14 PM  Result Value Ref Range   Glucose-Capillary 101 (H) 70 - 99 mg/dL  Glucose, capillary     Status: None   Collection Time: 02/26/20  3:45 AM  Result Value Ref Range   Glucose-Capillary 97 70 - 99 mg/dL  CBC     Status: Abnormal   Collection Time: 02/26/20  5:22 AM  Result Value Ref Range   WBC 5.2 4.0 - 10.5 K/uL   RBC 3.52 (L) 4.22 - 5.81 MIL/uL   Hemoglobin 11.3 (L) 13.0 - 17.0 g/dL   HCT 16.134.0 (L) 39 - 52 %   MCV 96.6 80.0 - 100.0 fL   MCH 32.1 26.0 - 34.0 pg   MCHC 33.2 30.0 - 36.0 g/dL   RDW 09.613.6 04.511.5 - 40.915.5 %   Platelets 196 150 - 400 K/uL   nRBC 0.0 0.0 - 0.2 %  Basic metabolic panel     Status: Abnormal   Collection Time: 02/26/20  5:22 AM  Result Value Ref Range   Sodium 129 (L) 135 - 145 mmol/L   Potassium 3.2 (L) 3.5 - 5.1 mmol/L   Chloride 93 (L) 98 - 111 mmol/L   CO2 23 22 - 32 mmol/L   Glucose, Bld 91 70 - 99 mg/dL   BUN 7 6 - 20 mg/dL   Creatinine, Ser 8.110.53 (L) 0.61 - 1.24 mg/dL   Calcium 9.2 8.9 - 91.410.3 mg/dL   GFR calc non Af Amer >60 >60 mL/min   GFR calc Af Amer >60 >60 mL/min   Anion gap 13 5 - 15  Magnesium     Status: Abnormal   Collection Time: 02/26/20  5:22 AM  Result Value Ref Range   Magnesium 1.6 (L) 1.7 - 2.4 mg/dL  Phosphorus     Status: None   Collection Time: 02/26/20  5:22 AM  Result Value Ref Range   Phosphorus 3.0 2.5 - 4.6 mg/dL  Lipid panel     Status: None   Collection Time: 02/26/20  5:22 AM  Result Value Ref Range   Cholesterol 140 0 - 200 mg/dL   Triglycerides 78 <782<150 mg/dL   HDL 53 >95>40 mg/dL   Total CHOL/HDL Ratio 2.6 RATIO   VLDL 16 0 - 40 mg/dL   LDL Cholesterol 71 0 - 99 mg/dL  Hepatic function panel     Status: Abnormal   Collection Time: 02/26/20  5:22 AM  Result Value Ref Range   Total Protein 7.5 6.5 - 8.1 g/dL   Albumin 2.8 (L) 3.5 - 5.0 g/dL   AST 46 (H) 15 - 41 U/L   ALT 32 0 - 44 U/L   Alkaline Phosphatase 147 (H) 38 - 126 U/L   Total Bilirubin 2.0 (H) 0.3 - 1.2 mg/dL   Bilirubin, Direct 0.4 (H) 0.0  - 0.2 mg/dL   Indirect Bilirubin 1.6 (H) 0.3 - 0.9 mg/dL   EEG  Result Date: 6/21/30867/28/2021 Charlsie QuestYadav, Priyanka O, MD     02/24/2020  6:56 PM Patient Name: Isabel CapriceGuy M Mcclurkin MRN: [id number] Epilepsy Attending: Charlsie QuestPriyanka O Yadav Referring Physician/Provider: Dr Milon DikesAshish Arora Date: 02/24/2020 Duration: 23.32 mins Patient history: 39yo M with right basal ganglia ICH. EEG to evaluate for seizure. Level of alertness: Awake, asleep AEDs during EEG study: ativan Technical aspects: This EEG study was done with scalp electrodes positioned according to the 10-20 International system of electrode placement. Electrical activity was acquired at a sampling rate of 500Hz  and reviewed with a high frequency filter of 70Hz  and a  low frequency filter of 1Hz . EEG data were recorded continuously and digitally stored. Description: No posterior dominant. Sleep was characterized by vertex waves, sleep spindles (12 to 14 Hz), maximal frontocentral region.  EEG showed continuous generalized 3-5hz  theta-delta slowing admixed with 15 to 18 Hz beta activity distributed symmetrically and diffusely. One episode of right arm twitching was recorded at 1631. Concomitant eeg before, during and after the event didnt show any eeg change. Hyperventilation and photic stimulation were not performed.   ABNORMALITY - Continuous slow, generalized - Excessive beta, generalized IMPRESSION: This study is suggestive of moderate diffuse encephalopathy, nonspecific etiology. The excessive beta activity seen in the background is most likely due to the effect of benzodiazepine and is a benign EEG pattern. One episode of right arm twitching was noted at 1631 without concomitant eeg change and was likely not epileptic. Of note, focal motor seizures may not be seen on scalp eeg. Therefore, clinical correlation is recommended. No epileptiform discharges were seen throughout the recording.   CT HEAD WO CONTRAST  Result Date: 02/26/2020 CLINICAL DATA:   Parenchymal hemorrhage, follow-up. EXAM: CT HEAD WITHOUT CONTRAST TECHNIQUE: Contiguous axial images were obtained from the base of the skull through the vertex without intravenous contrast. COMPARISON:  CT head without contrast 02/24/2020. FINDINGS: Brain: Right basal ganglia hemorrhage measures 2.8 x 4.8 x 1.9, approximately 13 mL, stable. Mild surrounding edema is stable. No new hemorrhage is present. Partial effacement of the right lateral ventricle is again noted. 5 mm midline shift is stable. No significant extra-axial fluid collection is present. Vascular: No hyperdense vessel or unexpected calcification. Skull: Calvarium is intact. No focal lytic or blastic lesions are present. No significant extracranial soft tissue lesion is present. Sinuses/Orbits: Chronic left maxillary sinus opacification is again seen. The paranasal sinuses and mastoid air cells are otherwise clear. The globes and orbits are within normal limits. IMPRESSION: 1. Stable appearance of right basal ganglia hemorrhage with mild surrounding edema. 2. Stable 5 mm midline shift. 3. No new hemorrhage. Electronically Signed   By: 02/26/2020 M.D.   On: 02/26/2020 05:23   Assessment/Plan: Diagnosis: R basal ganglia ICH 1. Does the need for close, 24 hr/day medical supervision in concert with the patient's rehab needs make it unreasonable for this patient to be served in a less intensive setting? Yes 2. Co-Morbidities requiring supervision/potential complications: UDS positive for benzodiazepines and barbiturates, EEG with generalized slowing, R gaze preference, ETOH abuse, HTN, Left sided weakness 3. Due to bladder management, bowel management, safety, skin/wound care, disease management, medication administration, pain management and patient education, does the patient require 24 hr/day rehab nursing? Yes 4. Does the patient require coordinated care of a physician, rehab nurse, therapy disciplines of PT, OT, SLP to address  physical and functional deficits in the context of the above medical diagnosis(es)? Yes Addressing deficits in the following areas: balance, endurance, locomotion, strength, transferring, bowel/bladder control, bathing, dressing, feeding, grooming, toileting, cognition, speech, language, swallowing and psychosocial support 5. Can the patient actively participate in an intensive therapy program of at least 3 hrs of therapy per day at least 5 days per week? Yes 6. The potential for patient to make measurable gains while on inpatient rehab is fair 7. Anticipated functional outcomes upon discharge from inpatient rehab are min assist  with PT, min assist with OT, min assist with SLP. 8. Estimated rehab length of stay to reach the above functional goals is: 24-26 days 9. Anticipated discharge destination: Home 10. Overall Rehab/Functional  Prognosis: fair  RECOMMENDATIONS: This patient's condition is appropriate for continued rehabilitative care in the following setting: CIR Patient has agreed to participate in recommended program. N/A Note that insurance prior authorization may be required for reimbursement for recommended care.  Comment: Mr. Kerstein has significant PT, OT, and SLP needs and would be a good CIR candidate if 24/7 supervision can be confirmed.  Mcarthur Rossetti Angiulli, PA-C 02/26/2020

## 2020-02-26 NOTE — Progress Notes (Signed)
STROKE TEAM PROGRESS NOTE   INTERVAL HISTORY Patient mom at bedside.  Patient eyes halfway open, more weaker than yesterday, still has tremor both upper extremity but improved from yesterday.  Oriented to self, age and people, follows simple commands.  CT repeat showed stable right ICH.  Still on Precedex and Ativan scheduled.  Vitals:   02/26/20 0700 02/26/20 0730 02/26/20 0800 02/26/20 0900  BP: (!) 146/85 (!) 103/87 127/79 (!) 126/89  Pulse:  72 57 59  Resp: '17 16 17 17  '$ Temp:   98.7 F (37.1 C)   TempSrc:   Axillary   SpO2:      Weight:      Height:       CBC:  Recent Labs  Lab 02/23/20 2009 02/24/20 0325 02/25/20 0243 02/26/20 0522  WBC 5.8   < > 7.6 5.2  NEUTROABS 4.9  --   --   --   HGB 13.2   < > 12.1* 11.3*  HCT 38.5*   < > 35.7* 34.0*  MCV 96.0   < > 97.5 96.6  PLT 209   < > 194 196   < > = values in this interval not displayed.   Basic Metabolic Panel:  Recent Labs  Lab 02/24/20 0325 02/24/20 0325 02/25/20 0243 02/26/20 0522  NA 130*   < > 132* 129*  K 3.8   < > 3.5 3.2*  CL 91*   < > 96* 93*  CO2 26   < > 21* 23  GLUCOSE 94   < > 81 91  BUN 5*   < > 8 7  CREATININE 0.64   < > 0.67 0.53*  CALCIUM 9.9   < > 9.3 9.2  MG 1.7  --   --  1.6*  PHOS 3.9  --   --  3.0   < > = values in this interval not displayed.   Lipid Panel:  Recent Labs  Lab 02/26/20 0522  CHOL 140  TRIG 78  HDL 53  CHOLHDL 2.6  VLDL 16  LDLCALC 71   HgbA1c:  Recent Labs  Lab 02/25/20 0243  HGBA1C 5.1   Urine Drug Screen:  Recent Labs  Lab 02/24/20 1839  LABOPIA NONE DETECTED  COCAINSCRNUR NONE DETECTED  LABBENZ POSITIVE*  AMPHETMU NONE DETECTED  THCU NONE DETECTED  LABBARB POSITIVE*    Alcohol Level  Recent Labs  Lab 02/23/20 2009  ETH <10    IMAGING past 24 hours CT HEAD WO CONTRAST  Result Date: 02/26/2020 CLINICAL DATA:  Parenchymal hemorrhage, follow-up. EXAM: CT HEAD WITHOUT CONTRAST TECHNIQUE: Contiguous axial images were obtained from the base of  the skull through the vertex without intravenous contrast. COMPARISON:  CT head without contrast 02/24/2020. FINDINGS: Brain: Right basal ganglia hemorrhage measures 2.8 x 4.8 x 1.9, approximately 13 mL, stable. Mild surrounding edema is stable. No new hemorrhage is present. Partial effacement of the right lateral ventricle is again noted. 5 mm midline shift is stable. No significant extra-axial fluid collection is present. Vascular: No hyperdense vessel or unexpected calcification. Skull: Calvarium is intact. No focal lytic or blastic lesions are present. No significant extracranial soft tissue lesion is present. Sinuses/Orbits: Chronic left maxillary sinus opacification is again seen. The paranasal sinuses and mastoid air cells are otherwise clear. The globes and orbits are within normal limits. IMPRESSION: 1. Stable appearance of right basal ganglia hemorrhage with mild surrounding edema. 2. Stable 5 mm midline shift. 3. No new hemorrhage. Electronically Signed   By:  Marin Roberts M.D.   On: 02/26/2020 05:23    PHYSICAL EXAM   Temp:  [98.4 F (36.9 C)-99.7 F (37.6 C)] 98.7 F (37.1 C) (07/30 0800) Pulse Rate:  [28-84] 59 (07/30 0900) Resp:  [16-30] 17 (07/30 0900) BP: (103-154)/(79-107) 126/89 (07/30 0900)  General - Well nourished, well developed, more awake alert than yesterday.  Ophthalmologic - fundi not visualized due to noncooperation.  Cardiovascular - Regular rhythm and rate.  Neuro - eyes halfway open, more awake alert than yesterday.  Orientated to self, people, place and age, not oriented to time.  Paucity of speech, severe dysarthria, follow simple midline and peripheral commands.  Blinking to visual threat bilaterally, PERRL, EOMI. Left facial droop. Tongue midline. LUE 3/5 and LLE withdraw 3/5. RUE at least 4/5. Sensation subjectively symmetrical, coordination not corporative and gait not tested.   ASSESSMENT/PLAN Tommy Garcia is a 39 y.o. male with history of  Etoh abuse, HTN presenting to [facility] with L sided weakness, R gaze preference in DTs.   Stroke: R basal ganglia ICH likely secondary to HTN   CT head R basal ganglia hemorrhage w/ R->L midline shift   CTA head & neck Unremarkable   Repeat CT head stable hemorrhage   CT repeat 7/30 stable hematoma, mildly increased midline shift and mass-effect  2D Echo EF 50-55%. No source of embolus   EEG excessive beta, generalized slowing  LDL 71   HgbA1c 5.1  UDS positive for benzo and barbiturates  VTE prophylaxis - heparin subq   No antithrombotic prior to admission, now on No antithrombotic given hemorrhage   Therapy recommendations CIR  Disposition:  pending   Hypertensive emergency  Home meds:  None listed . BP stable on the high end . SBP goal < 160  Weaned off cleviprex  On norvasc 10 and lisinopril 20 . Long-term BP goal normotensive  ETOH withdraw Delirium Tremens   Ativan for DTs  CIWA protocol - on ativan scheduled  alcohol level <10  On precedex  Thiamine/folate/MVI  advised to drink no more than 2 drink(s) a day  As per mom, pt drinks 12 beers average per day PTA  Tachypnea, reolsved  Likely related to alcohol withdraw and sedation  O2 sat WNL  CCM on board  Improved  Dysphagia  Severe Protein Malnutrition  Did not pass swallow  NPO now  Albumin 2.8  cortrak 7/30  On IVF @ 50  TF @ 60  Tobacco abuse  Quit smoking 2 mos ago  Smoking cessation counseling will be provided to continue cessation   Other Stroke Risk Factors  Substance abuse  - UDS + benzo + barbituate  Other Active Problems  Acute alcohol hepatitis, transaminitis AST/ALT 80/46->73/42->46/32, Alk Phos 234->211->147  Hyponatremia, Na 127-130-132-129  Hypokalemia, K 3.5-3.2 - supplement  Hypomagnesemia, Mg 1.6 - supplement  Hospital day # 3  This patient is critically ill due to right BG ICH, alcohol withdrawal, DT, hypertensive emergency,  dysphagia and at significant risk of neurological worsening, death form hematoma expansion, cerebral edema, brain herniation, DVT, hypertensive encephalopathy, aspiration pneumonia, seizure. This patient's care requires constant monitoring of vital signs, hemodynamics, respiratory and cardiac monitoring, review of multiple databases, neurological assessment, discussion with family, other specialists and medical decision making of high complexity. I spent 35 minutes of neurocritical care time in the care of this patient. I had long discussion with mom at bedside, updated pt current condition, treatment plan and potential prognosis, and answered all the questions.  She expressed understanding and appreciation.    Rosalin Hawking, MD PhD Stroke Neurology 02/26/2020 10:42 AM   To contact Stroke Continuity provider, please refer to [email or web address] After hours, contact General Neurology

## 2020-02-26 NOTE — Progress Notes (Signed)
Inpatient Rehab Admissions Coordinator Note:   Per therapy recommendations, pt was screened for CIR candidacy by Estill Dooms, PT, DPT.  At this time we are recommending a CIR consult and I will place one per our protocol.  Please contact me with questions.   Estill Dooms, PT, DPT [phone] 02/26/20 1:11 PM

## 2020-02-26 NOTE — Evaluation (Signed)
Occupational Therapy Treatment Patient Details Name: Tommy Garcia MRN: [id number] DOB: March 06, 1981 Today's Date: 02/26/2020    History of present illness Tommy Garcia is a 39 y.o. male who was transferred from [facility], admitted 7/27 with BG ICH, hypertensive emergency and DT's.   OT comments  Pt admitted with above. He demonstrates the below listed deficits and will benefit from continued OT to maximize safety and independence with BADLs.  Pt presents to OT with Lt hemiparesis, tremors, Lt inattention, impaired arousal, decreased attention, impaired problem solving, impaired balance.  He currently requires max A - total A for all aspects of ADLs and functional mobility.  He reports he was living at home with his father and was independent and working PTA.  Recommend CIR.     Follow Up Recommendations  CIR    Equipment Recommendations  None recommended by OT    Recommendations for Other Services Rehab consult    Precautions / Restrictions Precautions Precautions: Fall Precaution Comments: wrist restraints and mitts       Mobility Bed Mobility Overal bed mobility: Needs Assistance Bed Mobility: Supine to Sit;Sit to Supine     Supine to sit: +2 for safety/equipment;HOB elevated;Max assist Sit to supine: Mod assist;+2 for physical assistance   General bed mobility comments: assist for  L leg off bed and to lift trunk, patient initiating with R leg off bed  Transfers Overall transfer level: Needs assistance   Transfers: Sit to/from Stand Sit to Stand: +2 physical assistance;Mod assist         General transfer comment: lifting assist of 2 to stand with increased time and cues, stood x 2 but would not maintain, wanting to lie back down    Balance Overall balance assessment: Needs assistance Sitting-balance support: Feet supported Sitting balance-Leahy Scale: Zero Sitting balance - Comments: at times pushing back needing max A for balance at EOB, when focused, sat with min  A   Standing balance support: Bilateral upper extremity supported Standing balance-Leahy Scale: Zero Standing balance comment: +2 A for standing briefly at bedside                           ADL either performed or assessed with clinical judgement   ADL Overall ADL's : Needs assistance/impaired Eating/Feeding: NPO   Grooming: Wash/dry hands;Wash/dry face;Oral care;Brushing hair;Maximal assistance;Sitting Grooming Details (indicate cue type and reason): Pt require max A to wash face due to attentional deficits as well as incoordination  Upper Body Bathing: Maximal assistance;Sitting   Lower Body Bathing: Total assistance;Sit to/from stand   Upper Body Dressing : Total assistance;Sitting   Lower Body Dressing: Total assistance;Sit to/from stand Lower Body Dressing Details (indicate cue type and reason): Pt attempted to don socks, but repeatedly lost balance EOB with no awareness, and repeatedly dropped his sock with no awareness  Toilet Transfer: Total assistance Toilet Transfer Details (indicate cue type and reason): unable  Toileting- Clothing Manipulation and Hygiene: Total assistance;Sit to/from stand       Functional mobility during ADLs: Maximal assistance;+2 for physical assistance;+2 for safety/equipment       Vision   Additional Comments: unable to accurately assess fully due to impaired cognition.  Pt keeps eyes closed majority of the time, but will open them when prompted.  He will look Rt and Lt with prompting, but is unable to sustain attention to complete visual assessment    Perception Perception Perception Tested?: Yes Perception Deficits: Inattention/neglect Inattention/Neglect: Does not  attend to left side of body Comments: mild Lt inattention noted    Praxis Praxis Praxis tested?: Not tested    Cognition Arousal/Alertness: Lethargic Behavior During Therapy: Impulsive;Restless Overall Cognitive Status: Impaired/Different from baseline Area of  Impairment: Orientation;Attention;Following commands;Safety/judgement;Problem solving;Awareness                 Orientation Level: Disoriented to;Place;Time Current Attention Level: Focused   Following Commands: Follows one step commands inconsistently Safety/Judgement: Decreased awareness of safety;Decreased awareness of deficits   Problem Solving: Slow processing;Difficulty sequencing;Requires verbal cues;Requires tactile cues General Comments: requires constant cues to attend to task.  He demonstrates poor awareness of deficits         Exercises     Shoulder Instructions       General Comments Initially trying to sit up, but once up pushing back a bit, did try to don sock but too unstable pushing back and sliding off EOB; perseverating on thinking more about loaded baked potato soup and broccoli soup than sitting balance    Pertinent Vitals/ Pain       Pain Assessment: No/denies pain  Home Living Family/patient expects to be discharged to:: Private residence Living Arrangements: Parent Available Help at Discharge: Family Type of Home: House Home Access: Stairs to enter Secretary/administrator of Steps: 3   Home Layout: Able to live on main level with bedroom/bathroom;Laundry or work area in basement;Two level     Bathroom Shower/Tub: Chief Strategy Officer: Standard     Home Equipment: Grab bars - tub/shower   Additional Comments: mother stopped OT in the hallway and reports that pts father cares for his 33+ y.o. 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, but also states has fallen down the steps numerous time so that his bedroom is now on the main level   Frequency  Min 2X/week        Progress Toward Goals  OT Goals(current goals can now be found in the care plan section)     Acute Rehab OT  Goals Patient Stated Goal: unable to state OT Goal Formulation: Patient unable to participate in goal setting Time For Goal Achievement: 03/11/20 Potential to Achieve Goals: Good ADL Goals Pt Will Perform Eating: with min assist;sitting Pt Will Perform Grooming: with mod assist;standing Pt Will Perform Upper Body Bathing: with min assist;sitting Pt Will Perform Lower Body Bathing: with mod assist;sit to/from stand Pt Will Transfer to Toilet: with min assist;stand pivot transfer;bedside commode Pt Will Perform Toileting - Clothing Manipulation and hygiene: with mod assist;sit to/from stand Additional ADL Goal #1: Pt will locate needed ADL items on his Lt with min cues Additional ADL Goal #2: Pt will sustain attention to simple ADL task x 3 mins with min cues  Plan Discharge plan remains appropriate    Co-evaluation    PT/OT/SLP Co-Evaluation/Treatment: Yes Reason for Co-Treatment: Necessary to address cognition/behavior during functional activity;For patient/therapist safety;To address functional/ADL transfers PT goals addressed during session: Mobility/safety with mobility;Balance OT goals addressed during session: ADL's and self-care      AM-PAC OT "6 Clicks" Daily Activity     Outcome Measure   Help from another person eating meals?: Total Help from another person taking care of personal grooming?: A Lot Help from another person toileting, which includes using toliet, bedpan, or urinal?: Total Help from another  person bathing (including washing, rinsing, drying)?: Total Help from another person to put on and taking off regular upper body clothing?: Total Help from another person to put on and taking off regular lower body clothing?: Total 6 Click Score: 7    End of Session    OT Visit Diagnosis: Unsteadiness on feet (R26.81);Cognitive communication deficit (R41.841) Symptoms and signs involving cognitive functions: Other Nontraumatic ICH   Activity Tolerance Patient  limited by lethargy   Patient Left in bed;with call bell/phone within reach;with bed alarm set;with restraints reapplied   Nurse Communication Mobility status        Time: 3419-3790 OT Time Calculation (min): 25 min  Charges: OT General Charges $OT Visit: 1 Visit OT Evaluation $OT Eval Moderate Complexity: 1 Mod  [name]., OTR/L Acute Rehabilitation Services Pager [phone] Office [phone]    [name] M 02/26/2020, 1:01 PM

## 2020-02-26 NOTE — Evaluation (Signed)
Physical Therapy Evaluation Patient Details Name: Tommy Garcia MRN: [id number] DOB: 1981-04-20 Today's Date: 02/26/2020   History of Present Illness  Tommy Garcia is a 39 y.o. male who was transferred from [facility], admitted 7/27 with BG ICH, hypertensive emergency and DT's.  Clinical Impression  Patient presents with decreased cognition, decreased L UE/LE strength, decreased balance, decreased activity tolerance and he will benefit from skilled PT in the acute setting to allow return home with family support following CIR level rehab stay.  Demonstrated decreased deficit awareness, decreased attention and only limited arousal.  Mother in the room initially, but left prior to evaluation.  Reports he lives with his dad.  Feel should progress from mobility perspective to be able to go home with family support after intensive inpatient rehab.     Follow Up Recommendations CIR    Equipment Recommendations  Other (comment) (to be assessed)    Recommendations for Other Services Rehab consult     Precautions / Restrictions Precautions Precautions: Fall Precaution Comments: wrist restraints and mitts      Mobility  Bed Mobility Overal bed mobility: Needs Assistance Bed Mobility: Supine to Sit;Sit to Supine     Supine to sit: +2 for safety/equipment;HOB elevated;Max assist Sit to supine: Mod assist;+2 for physical assistance   General bed mobility comments: assist for  L leg off bed and to lift trunk, patient initiating with R leg off bed  Transfers Overall transfer level: Needs assistance   Transfers: Sit to/from Stand Sit to Stand: +2 physical assistance;Mod assist         General transfer comment: lifting assist of 2 to stand with increased time and cues, stood x 2 but would not maintain, wanting to lie back down  Ambulation/Gait                Stairs            Wheelchair Mobility    Modified Rankin (Stroke Patients Only) Modified Rankin (Stroke Patients  Only) Pre-Morbid Rankin Score: No symptoms Modified Rankin: Severe disability     Balance Overall balance assessment: Needs assistance Sitting-balance support: Feet supported Sitting balance-Leahy Scale: Zero Sitting balance - Comments: at times pushing back needing max A for balance at EOB, when focused, sat with min A   Standing balance support: Bilateral upper extremity supported Standing balance-Leahy Scale: Zero Standing balance comment: +2 A for standing briefly at bedside                             Pertinent Vitals/Pain Pain Assessment: No/denies pain    Home Living Family/patient expects to be discharged to:: Private residence Living Arrangements: Parent Available Help at Discharge: Family Type of Home: House Home Access: Stairs to enter   Secretary/administrator of Steps: 3 Home Layout: Able to live on main level with bedroom/bathroom;Laundry or work area in basement;Two level Home Equipment: Grab bars - tub/shower      Prior Function Level of Independence: Independent         Comments: 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  He demonstrates ~25% finger flex and extension with max cues.  He will attempt to lift Lt UE to touch therapist's hand, but unable to lift UE against gravity  LUE Coordination: decreased gross motor;decreased fine motor    Lower Extremity Assessment Lower Extremity Assessment: LLE deficits/detail LLE Deficits / Details: AAROM WFL, strength hip fledxion 2-/5, knee extension 3-/5 LLE Coordination: decreased fine motor;decreased gross motor        Communication   Communication: Expressive difficulties (mumbling/dysarthric)  Cognition Arousal/Alertness: Lethargic Behavior During Therapy: Impulsive;Restless Overall Cognitive Status: Impaired/Different from baseline Area of Impairment: Orientation;Attention;Following commands;Safety/judgement;Problem solving;Awareness                 Orientation Level: Disoriented to;Place;Time Current Attention Level: Focused   Following Commands: Follows one step commands inconsistently Safety/Judgement: Decreased awareness of safety;Decreased awareness of deficits   Problem Solving: Slow processing;Difficulty sequencing;Requires verbal cues;Requires tactile cues General Comments: requires constant cues to attend to task.  He demonstrates poor awareness of deficits       General Comments General comments (skin integrity, edema, etc.): Initially trying to sit up, but once up pushing back a bit, did try to don sock but too unstable pushing back and sliding off EOB; perseverating on thinking more about loaded baked potato soup and broccoli soup than sitting balance    Exercises     Assessment/Plan    PT Assessment Patient needs continued PT services  PT Problem List Decreased strength;Decreased mobility;Decreased safety awareness;Decreased cognition;Decreased balance;Decreased knowledge of precautions       PT Treatment Interventions DME instruction;Therapeutic activities;Cognitive remediation;Patient/family education;Gait training;Balance training;Functional mobility training;Neuromuscular re-education    PT Goals (Current goals can be found in the Care Plan section)  Acute Rehab PT Goals Patient Stated Goal: unable to state PT Goal Formulation: Patient unable to participate in goal setting Time For Goal Achievement: 03/11/20 Potential to Achieve Goals: Good    Frequency Min 4X/week   Barriers to discharge        Co-evaluation PT/OT/SLP Co-Evaluation/Treatment: Yes Reason  for Co-Treatment: Necessary to address cognition/behavior during functional activity;For patient/therapist safety;To address functional/ADL transfers PT goals addressed during session: Mobility/safety with mobility;Balance OT goals addressed during session: ADL's and self-care       AM-PAC PT "6 Clicks" Mobility  Outcome Measure Help needed turning from your back to your side while in a flat bed without using bedrails?: A Lot Help needed moving from lying on your back to sitting on the side of a flat bed without using bedrails?: A Lot Help needed moving to and from a bed to a chair (including a wheelchair)?: Total Help needed standing up from a chair using your arms (e.g., wheelchair or bedside chair)?: Total Help needed to walk in hospital room?: Total Help needed climbing 3-5 steps with a railing? : Total 6 Click Score: 8    End of Session Equipment Utilized During Treatment: Gait belt Activity Tolerance: Patient limited by fatigue Patient left: in bed;with call bell/phone within reach;with bed alarm set   PT Visit Diagnosis: Other abnormalities of gait and mobility (R26.89);Hemiplegia and hemiparesis Hemiplegia - Right/Left: Left Hemiplegia - dominant/non-dominant: Non-dominant Hemiplegia - caused by: Nontraumatic intracerebral hemorrhage    Time: 0922-0949 PT Time Calculation (min) (ACUTE ONLY): 27 min   Charges:   PT Evaluation $PT Eval Moderate Complexity: 1 Mod          Sheran Lawless, PT Acute Rehabilitation Services Pager:[phone] Office:[phone] 02/26/2020   Elray Mcgregor 02/26/2020, 12:54 PM

## 2020-02-26 NOTE — Procedures (Signed)
Cortrak  Person Inserting Tube:  Chyna Kneece M, RD Tube Type:  Cortrak - 43 inches Tube Location:  Left nare Initial Placement:  Stomach Secured by: Bridle Technique Used to Measure Tube Placement:  Documented cm marking at nare/ corner of mouth Cortrak Secured At:  70 cm Procedure Comments:  Cortrak Tube Team Note:  Consult received to place a Cortrak feeding tube.   No x-ray is required. RN may begin using tube.    If the tube becomes dislodged please keep the tube and contact the Cortrak team at [email or web address] (password [email or web address]) for replacement.  If after hours and replacement cannot be delayed, place a NG tube and confirm placement with an abdominal x-ray.      Andree Heeg, MS, RD, LDN, CNSC Inpatient Clinical Dietitian RD pager # available in AMION  After hours/weekend pager # available in AMION      

## 2020-02-26 NOTE — Progress Notes (Signed)
NAME:  Tommy Garcia, MRN:  [id number], DOB:  1980/10/03, LOS: 3 ADMISSION DATE:  02/23/2020, CONSULTATION DATE:  02/24/20 REFERRING MD:  Wilford Corner  CHIEF COMPLAINT:  DT's   Brief History   Tommy Garcia is a 39 y.o. male who was transferred from [facility], admitted 7/27 with BG ICH, hypertensive emergency and DT's. PCCM asked to assist with management of DT's.  Past Medical History  has ABSCESS, TOOTH; Hypertension; ICH (intracerebral hemorrhage) (HCC); Alcohol withdrawal syndrome with complication (HCC); and DTs (delirium tremens) (HCC) on their problem list.  Significant Hospital Events   7/27 > transferred from [facility].  Consults:  Neurosurgery, PCCM.  Procedures:  None.  Significant Diagnostic Tests:  CT head 7/27 > right BG hemorrhage. CTA head 7/27 > neg. CT head 7/28 > Size stable 16 cc hematoma at the right basal ganglia. EEG 7/28 > no seizure, consistent with encephalopathy  Micro Data:  COVID 7/27 > neg.  Antimicrobials:  None.   Interim history/subjective:  Lying in bed sleeping but will easily arouse to verbal stimuli, requesting something to eat and drink before drifting off to sleep.   Objective:  Blood pressure (!) 103/87, pulse 72, temperature 99.7 F (37.6 C), temperature source Oral, resp. rate 16, height 5\' 11"  (1.803 m), weight 73 kg, SpO2 100 %.        Intake/Output Summary (Last 24 hours) at 02/26/2020 0759 Last data filed at 02/25/2020 2021 Gross per 24 hour  Intake 1450.34 ml  Output 850 ml  Net 600.34 ml   Filed Weights   02/23/20 1911 02/24/20 1500  Weight: 72.6 kg 73 kg    Examination: General: Slightly disheveled adult male lying in bed in no acute distress  HEENT: Jane/AT, MM pink/moist, PERRL,  Neuro: Sleepy but will arouse to verbal stimuli and follow simple commands, left upper extremity flaccid, weak movement seen in all other extermities CV: s1s2 regular rate and rhythm, no murmur, rubs, or gallops,  PULM:  Clear to ascultation bilaterally, no  increased work of breathing, oxygen saturations 100% on RA GI: soft, bowel sounds active in all 4 quadrants, non-tender, non-distended Extremities: warm/dry, no edema  Skin: no rashes or lesions  Assessment & Plan:  Intraparenchymal right basal ganglia hemorrhage due to uncontrolled hypertension Hypertensive emergency Alcohol withdrawal, complicated with delirium tremens Hyponatremia/hypokalemia Acute alcoholic hepatitis -Patient's repeat CT scan shows stability of right basal ganglia bleed His ICH score is 0  Plan: Primary management per neurology  Resume home BP medications when cleared for oral diet  Wean Cleviprex for SBP goal less than 160 per neurology  Wean Precedex, utilize PRN Ativan per CIWA scale Continue Thiamin, folate, and multivitamin Nutrition and bowel regiment  Seizure precautions  Begin diet Supplement electrolytes  Trend LFTs, improving  SLP/PT/OT evals    Best Practice:  Diet: NPO Pain/Anxiety/Delirium protocol (if indicated): Precedex per protocol VAP protocol (if indicated): N/A. DVT prophylaxis: Per primary. GI prophylaxis: N/A. Glucose control: N/A. Mobility: Bedrest. Code Status: Full. Family Communication: Per primary. Disposition: ICU.  Labs   CBC: Recent Labs  Lab 02/23/20 2009 02/24/20 0325 02/25/20 0243 02/26/20 0522  WBC 5.8 5.0 7.6 5.2  NEUTROABS 4.9  --   --   --   HGB 13.2 13.0 12.1* 11.3*  HCT 38.5* 38.7* 35.7* 34.0*  MCV 96.0 96.0 97.5 96.6  PLT 209 222 194 196   Basic Metabolic Panel: Recent Labs  Lab 02/23/20 2009 02/24/20 0325 02/25/20 0243 02/26/20 0522  NA 127* 130* 132* 129*  K 4.1 3.8 3.5 3.2*  CL 88* 91* 96* 93*  CO2 26 26 21* 23  GLUCOSE 102* 94 81 91  BUN 7 5* 8 7  CREATININE 0.53* 0.64 0.67 0.53*  CALCIUM 9.7 9.9 9.3 9.2  MG  --  1.7  --  1.6*  PHOS  --  3.9  --  3.0   GFR: Estimated Creatinine Clearance: 126.7 mL/min (A) (by C-G formula based on SCr of 0.53 mg/dL (L)). Recent Labs  Lab  02/23/20 2009 02/24/20 0325 02/25/20 0243 02/26/20 0522  WBC 5.8 5.0 7.6 5.2   Liver Function Tests: Recent Labs  Lab 02/23/20 2009 02/24/20 0325 02/26/20 0522  AST 80* 73* 46*  ALT 46* 42 32  ALKPHOS 234* 211* 147*  BILITOT 1.7* 1.7* 2.0*  PROT 8.9* 8.5* 7.5  ALBUMIN 3.9 3.5 2.8*   No results for input(s): LIPASE, AMYLASE in the last 168 hours. Recent Labs  Lab 02/24/20 0327  AMMONIA 40*   ABG No results found for: PHART, PCO2ART, PO2ART, HCO3, TCO2, ACIDBASEDEF, O2SAT  Coagulation Profile: Recent Labs  Lab 02/23/20 2009  INR 1.1   Cardiac Enzymes: Recent Labs  Lab 02/23/20 2009  CKTOTAL 282   HbA1C: Hgb A1c MFr Bld  Date/Time Value Ref Range Status  02/25/2020 02:43 AM 5.1 4.8 - 5.6 % Final    Comment:    (NOTE) Pre diabetes:          5.7%-6.4%  Diabetes:              >6.4%  Glycemic control for   <7.0% adults with diabetes    CBG: Recent Labs  Lab 02/25/20 1911 02/25/20 2314 02/26/20 0345  GLUCAP 109* 101* 97     CRITICAL CARE Performed by: Delfin Gant  Total critical care time: 35 minutes  Critical care time was exclusive of separately billable procedures and treating other patients.  Critical care was necessary to treat or prevent imminent or life-threatening deterioration.  Critical care was time spent personally by me on the following activities: development of treatment plan with patient and/or surrogate as well as nursing, discussions with consultants, evaluation of patient's response to treatment, examination of patient, obtaining history from patient or surrogate, ordering and performing treatments and interventions, ordering and review of laboratory studies, ordering and review of radiographic studies, pulse oximetry and re-evaluation of patient's condition.  Delfin Gant, NP-C [facility] Contact / Pager information can be found on Amion  02/26/2020, 8:05 AM

## 2020-02-26 NOTE — Evaluation (Signed)
Speech Language Pathology Evaluation Patient Details Name: Tommy Garcia MRN: [id number] DOB: [birth year] Today's Date: 02/26/2020 Time: 1517-6160 SLP Time Calculation (min) (ACUTE ONLY): 8 min  Problem List:  Patient Active Problem List   Diagnosis Date Noted  . Alcohol withdrawal syndrome with complication (HCC)   . DTs (delirium tremens) (HCC)   . ICH (intracerebral hemorrhage) (HCC) 02/23/2020  . Hypertension 10/29/2013  . ABSCESS, TOOTH 03/10/2009   Past Medical History:  Past Medical History:  Diagnosis Date  . Alcohol abuse   . Hypertension    Past Surgical History:  Past Surgical History:  Procedure Laterality Date  . HAND TENDON SURGERY Right   . OPEN REDUCTION INTERNAL FIXATION (ORIF) DISTAL RADIAL FRACTURE Left 12/26/2017   Procedure: OPEN REDUCTION INTERNAL FIXATION (ORIF) DISTAL RADIAL FRACTURE,;  Surgeon: Betha Loa, MD;  Location: [facility];  Service: Orthopedics;  Laterality: Left;  . WRIST SURGERY Left around 1995   HPI:  39 y.o. male with history of Etoh abuse, HTN presenting to [facility] with L sided weakness, R gaze preference in DTs.  Transferred to [facility]; dx right basal ganglia ICH likely secondary to HTN.   Assessment / Plan / Recommendation Clinical Impression  Pt presents with a severe dysarthria of speech, poor arousal, disorientation to time/place, impulsivity and restlessness, significant inattention.  He will benefit from SLP intervention to address the above and further assess cognition as MS allows.     SLP Assessment  SLP Recommendation/Assessment: Patient needs continued Speech Lanaguage Pathology Services SLP Visit Diagnosis: Cognitive communication deficit (R41.841)    Follow Up Recommendations  Other (comment) (tba)    Frequency and Duration min 3x week  2 weeks      SLP Evaluation Cognition  Overall Cognitive Status: Impaired/Different from baseline Arousal/Alertness: Lethargic Orientation Level: Oriented  to person;Disoriented to place;Disoriented to time Attention: Focused Focused Attention: Impaired Focused Attention Impairment: Verbal basic;Functional basic Memory: Impaired Awareness: Impaired Awareness Impairment: Intellectual impairment Behaviors: Restless;Impulsive Safety/Judgment: Impaired       Comprehension  Auditory Comprehension Overall Auditory Comprehension:  (tba) Commands: Impaired One Step Basic Commands: 75-100% accurate Two Step Basic Commands: 25-49% accurate Reading Comprehension Reading Status: Not tested    Expression Expression Primary Mode of Expression: Verbal Verbal Expression Overall Verbal Expression: Impaired Initiation: No impairment Automatic Speech: Counting;Day of week Level of Generative/Spontaneous Verbalization: Sentence Repetition: No impairment Written Expression Dominant Hand: Right Written Expression: Not tested   Oral / Motor  Oral Motor/Sensory Function Overall Oral Motor/Sensory Function: Moderate impairment Facial ROM: Reduced left;Suspected CN VII (facial) dysfunction Facial Symmetry: Abnormal symmetry left;Suspected CN VII (facial) dysfunction Lingual ROM: Reduced left;Suspected CN XII (hypoglossal) dysfunction Lingual Symmetry: Abnormal symmetry left;Suspected CN XII (hypoglossal) dysfunction Motor Speech Overall Motor Speech: Impaired Respiration: Within functional limits Phonation: Wet Resonance: Hypernasality Articulation: Impaired Level of Impairment: Word Intelligibility: Intelligibility reduced Word: 0-24% accurate Phrase: 0-24% accurate   GO                    Blenda Mounts Laurice 02/26/2020, 2:42 PM  Siboney Requejo L. Samson Frederic, MA CCC/SLP Acute Rehabilitation Services Office number [phone] Pager [phone]

## 2020-02-27 LAB — CBC
HCT: 38.6 % — ABNORMAL LOW (ref 39.0–52.0)
Hemoglobin: 13.4 g/dL (ref 13.0–17.0)
MCH: 32.9 pg (ref 26.0–34.0)
MCHC: 34.7 g/dL (ref 30.0–36.0)
MCV: 94.8 fL (ref 80.0–100.0)
Platelets: 198 10*3/uL (ref 150–400)
RBC: 4.07 MIL/uL — ABNORMAL LOW (ref 4.22–5.81)
RDW: 13.3 % (ref 11.5–15.5)
WBC: 5 10*3/uL (ref 4.0–10.5)
nRBC: 0 % (ref 0.0–0.2)

## 2020-02-27 LAB — GLUCOSE, CAPILLARY
Glucose-Capillary: 111 mg/dL — ABNORMAL HIGH (ref 70–99)
Glucose-Capillary: 114 mg/dL — ABNORMAL HIGH (ref 70–99)
Glucose-Capillary: 111 mg/dL — ABNORMAL HIGH (ref 70–99)
Glucose-Capillary: 116 mg/dL — ABNORMAL HIGH (ref 70–99)
Glucose-Capillary: 114 mg/dL — ABNORMAL HIGH (ref 70–99)
Glucose-Capillary: 121 mg/dL — ABNORMAL HIGH (ref 70–99)

## 2020-02-27 LAB — BASIC METABOLIC PANEL
Anion gap: 13 (ref 5–15)
BUN: 8 mg/dL (ref 6–20)
CO2: 23 mmol/L (ref 22–32)
Calcium: 9.2 mg/dL (ref 8.9–10.3)
Chloride: 94 mmol/L — ABNORMAL LOW (ref 98–111)
Creatinine, Ser: 0.52 mg/dL — ABNORMAL LOW (ref 0.61–1.24)
GFR calc Af Amer: >60 mL/min (ref >60)
GFR calc non Af Amer: >60 mL/min (ref >60)
Glucose, Bld: 102 mg/dL — ABNORMAL HIGH (ref 70–99)
Potassium: 3.1 mmol/L — ABNORMAL LOW (ref 3.5–5.1)
Sodium: 130 mmol/L — ABNORMAL LOW (ref 135–145)

## 2020-02-27 LAB — MAGNESIUM: Magnesium: 1.7 mg/dL (ref 1.7–2.4)

## 2020-02-27 LAB — PHOSPHORUS: Phosphorus: 3.2 mg/dL (ref 2.5–4.6)

## 2020-02-27 MED ORDER — SODIUM CHLORIDE 0.9 % IV SOLN
INTRAVENOUS | Status: DC
  Filled 2020-02-27: qty 1000

## 2020-02-27 MED ORDER — PHENOBARBITAL SODIUM 65 MG/ML IJ SOLN
65.0000 mg | Freq: Once | INTRAMUSCULAR | Status: AC
  Administered 2020-02-27: 65 mg via INTRAVENOUS
  Filled 2020-02-27: qty 1

## 2020-02-27 MED ORDER — THIAMINE HCL 100 MG PO TABS
100.0000 mg | ORAL_TABLET | Freq: Every day | ORAL | Status: DC
  Administered 2020-02-27 – 2020-03-05 (×8): 100 mg
  Filled 2020-02-27 (×8): qty 1

## 2020-02-27 MED ORDER — POTASSIUM CHLORIDE 20 MEQ/15ML (10%) PO SOLN
40.0000 meq | Freq: Two times a day (BID) | ORAL | Status: AC
  Administered 2020-02-27 – 2020-02-28 (×3): 40 meq
  Filled 2020-02-27 (×3): qty 30

## 2020-02-27 MED ORDER — MAGNESIUM SULFATE 2 GM/50ML IV SOLN
2.0000 g | Freq: Once | INTRAVENOUS | Status: AC
  Administered 2020-02-27: 2 g via INTRAVENOUS
  Filled 2020-02-27: qty 50

## 2020-02-27 MED ORDER — LORAZEPAM 1 MG PO TABS: 1.0000 mg | ORAL_TABLET | ORAL | Status: DC | PRN

## 2020-02-27 MED ORDER — ORAL CARE MOUTH RINSE
15.0000 mL | Freq: Two times a day (BID) | OROMUCOSAL | Status: DC
  Administered 2020-02-27 – 2020-03-10 (×24): 15 mL via OROMUCOSAL

## 2020-02-27 MED ORDER — SODIUM CHLORIDE 0.9 % IV SOLN
INTRAVENOUS | Status: DC
  Filled 2020-02-27 (×17): qty 1000

## 2020-02-27 MED ORDER — LORAZEPAM 2 MG/ML IJ SOLN
1.0000 mg | INTRAMUSCULAR | Status: DC | PRN
  Administered 2020-02-27: 2 mg via INTRAVENOUS
  Administered 2020-02-27: 1 mg via INTRAVENOUS
  Administered 2020-02-27: 2 mg via INTRAVENOUS
  Administered 2020-02-27: 1 mg via INTRAVENOUS
  Administered 2020-02-28: 3 mg via INTRAVENOUS
  Administered 2020-02-28 (×2): 2 mg via INTRAVENOUS
  Filled 2020-02-27 (×5): qty 1
  Filled 2020-02-27: qty 2
  Filled 2020-02-27: qty 1

## 2020-02-27 MED ORDER — PANTOPRAZOLE SODIUM 40 MG PO PACK
40.0000 mg | PACK | Freq: Every day | ORAL | Status: DC
  Administered 2020-02-27 – 2020-03-05 (×8): 40 mg
  Filled 2020-02-27 (×8): qty 20

## 2020-02-27 MED ORDER — CHLORHEXIDINE GLUCONATE 0.12 % MT SOLN
15.0000 mL | Freq: Two times a day (BID) | OROMUCOSAL | Status: DC
  Administered 2020-02-27 – 2020-03-10 (×23): 15 mL via OROMUCOSAL
  Filled 2020-02-27 (×21): qty 15

## 2020-02-27 NOTE — Plan of Care (Signed)
  Problem: Nutrition: Goal: Adequate nutrition will be maintained Outcome: Progressing   

## 2020-02-27 NOTE — Progress Notes (Signed)
STROKE TEAM PROGRESS NOTE   INTERVAL HISTORY Still somewhat drowsy but otherwise neuro examination is unchanged.  Agitation and tremors/shaking improved.  Vitals:   02/27/20 0630 02/27/20 0645 02/27/20 0700 02/27/20 0703  BP: (!) 181/100 (!) 155/89  (!) 140/123  Pulse: 57 53 62   Resp: _0 Temp:      TempSrc:      SpO2:  100% 100%   Weight:      Height:       CBC:  Recent Labs  Lab 02/23/20 2009 02/24/20 0325 02/25/20 0243 02/26/20 0522  WBC 5.8   < > 7.6 5.2  NEUTROABS 4.9  --   --   --   HGB 13.2   < > 12.1* 11.3*  HCT 38.5*   < > 35.7* 34.0*  MCV 96.0   < > 97.5 96.6  PLT 209   < > 194 196   < > = values in this interval not displayed.   Basic Metabolic Panel:  Recent Labs  Lab 02/24/20 0325 02/24/20 0325 02/25/20 0243 02/26/20 0522  NA 130*   < > 132* 129*  K 3.8   < > 3.5 3.2*  CL 91*   < > 96* 93*  CO2 26   < > 21* 23  GLUCOSE 94   < > 81 91  BUN 5*   < > 8 7  CREATININE 0.64   < > 0.67 0.53*  CALCIUM 9.9   < > 9.3 9.2  MG 1.7  --   --  1.6*  PHOS 3.9  --   --  3.0   < > = values in this interval not displayed.   Lipid Panel:  Recent Labs  Lab 02/26/20 0522  CHOL 140  TRIG 78  HDL 53  CHOLHDL 2.6  VLDL 16  LDLCALC 71   HgbA1c:  Recent Labs  Lab 02/25/20 0243  HGBA1C 5.1   Urine Drug Screen:  Recent Labs  Lab 02/24/20 1839  LABOPIA NONE DETECTED  COCAINSCRNUR NONE DETECTED  LABBENZ POSITIVE*  AMPHETMU NONE DETECTED  THCU NONE DETECTED  LABBARB POSITIVE*    Alcohol Level  Recent Labs  Lab 02/23/20 2009  ETH <10    IMAGING past 24 hours  No results found.  PHYSICAL EXAM   Temp:  [97.9 F (36.6 C)-98.7 F (37.1 C)] 98 F (36.7 C) (07/31 0400) Pulse Rate:  [49-104] 62 (07/31 0700) Resp:  [13-28] 18 (07/31 0700) BP: (103-187)/(76-123) 140/123 (07/31 0703) SpO2:  [100 %] 100 % (07/31 0700)  General - Well nourished, well developed, more awake alert than yesterday.  Ophthalmologic - fundi not visualized due to  noncooperation.  Cardiovascular - Regular rhythm and rate.  Neuro -  Somewhat drowsy but opens eyes spontaneously.  Orientated to self, people, place and age, not oriented to time.  Paucity of speech, severe dysarthria, follow simple midline and peripheral commands.  Blinking to visual threat bilaterally, PERRL, EOMI. Left facial droop. Tongue midline. LUE 3/5 and LLE withdraw 4/5. RUE at least 4/5. Sensation subjectively symmetrical, coordination not corporative and gait not tested.   ASSESSMENT/PLAN Tommy Garcia is a 39 y.o. male with history of Etoh abuse and HTN presenting to [facility] with L sided weakness, R gaze preference in DTs.   Stroke: R basal ganglia ICH likely secondary to HTN   CT head R basal ganglia hemorrhage w/ R->L midline shift   CTA head & neck Unremarkable   Repeat  CT head stable hemorrhage   CT repeat 7/30 stable hematoma, mildly increased midline shift and mass-effect  2D Echo EF 50-55%. No source of embolus   EEG 7/28 - excessive beta, generalized slowing  LDL 71   HgbA1c 5.1  UDS positive for benzo and barbiturates  VTE prophylaxis - heparin subq   No antithrombotic prior to admission, now on No antithrombotic given hemorrhage   Therapy recommendations CIR  Disposition:  pending   Hypertensive emergency  Home meds:  None listed . BP stable on the high end . SBP goal < 160  Weaned off cleviprex  On norvasc 10 and lisinopril 20 . Long-term BP goal normotensive  ETOH withdraw Delirium Tremens   Ativan for DTs  CIWA protocol - on ativan scheduled  alcohol level <10  On precedex  Thiamine/folate/MVI  advised to drink no more than 2 drink(s) a day  As per mom, pt drinks 12 beers average per day PTA  Tachypnea, reolsved  Likely related to alcohol withdraw and sedation  O2 sat WNL  CCM on board  Improved  Dysphagia  Severe Protein Malnutrition  Did not pass swallow  NPO now  Albumin 2.8  Cortrak  7/30  On IVF @ 50  TF @ 60  Tobacco abuse  Quit smoking 2 mos ago  Smoking cessation counseling will be provided to continue cessation   Other Stroke Risk Factors  Substance abuse  - UDS + benzo + barbituate  Other Active Problems  Acute alcohol hepatitis, transaminitis AST/ALT 80/46->73/42->46/32, Alk Phos 234->211->147 (last checked 02/26/20)  Hyponatremia, Na 127-130-132-129 - pending  Hypokalemia, K 3.5 - 3.2 - supplement - pending  Hypomagnesemia, Mg 1.6 - supplement - pending  Hospital day # 4  This patient is critically ill due to stroke post procedure and at significant risk of neurological worsening, death form severe anemia, bleeding, recurrent stroke, intracranial stenosis. This patient's care requires constant monitoring of vital signs, hemodynamics, respiratory and cardiac monitoring, review of multiple databases, neurological assessment, other specialists and medical decision making of high complexity. I spent 35 minutes of neurocritical care time in the care of this patient.       To contact Stroke Continuity provider, please refer to [email or web address] After hours, contact General Neurology

## 2020-02-27 NOTE — Progress Notes (Signed)
Inpatient Rehab Admissions:  Inpatient Rehab Consult received.  I met with pt and father, Elta Guadeloupe, at the bedside for rehabilitation assessment and to discuss goals and expectations of an inpatient rehab admission.  Both acknowledged understanding of CIR goals and expectations. Both are interested in CIR if can afford.  Pt appears to be an appropriate candidate for potential admission. Will continue to follow progress with therapies and medical workup.  Signed: Gayland Curry, [place], [place] Admissions Coordinator [phone]

## 2020-02-27 NOTE — Progress Notes (Signed)
NAME:  Tommy Garcia, MRN:  [id number], DOB:  15-Oct-1980, LOS: 4 ADMISSION DATE:  02/23/2020, CONSULTATION DATE:  02/24/20 REFERRING MD:  Wilford Corner  CHIEF COMPLAINT:  DT's   Brief History   Tommy Garcia is a 39 y.o. male who was transferred from [facility], admitted 7/27 with BG ICH, hypertensive emergency and DT's. PCCM asked to assist with management of DT's.  Past Medical History  has ABSCESS, TOOTH; Hypertension; ICH (intracerebral hemorrhage) (HCC); Alcohol withdrawal syndrome with complication (HCC); DTs (delirium tremens) (HCC); and Malnutrition of moderate degree on their problem list.  Significant Hospital Events   7/27 > transferred from [facility].  Consults:  Neurosurgery, PCCM.  Procedures:  None.  Significant Diagnostic Tests:  CT head 7/27 > right BG hemorrhage. CTA head 7/27 > neg. CT head 7/28 > Size stable 16 cc hematoma at the right basal ganglia. EEG 7/28 > no seizure, consistent with encephalopathy  Micro Data:  COVID 7/27 > neg.  Antimicrobials:  None.   Interim history/subjective:  Lying in bed sleeping but will easily arouse with tremulousness.  Remains on Precedex  Objective:  Blood pressure (!) 117/91, pulse 61, temperature 98.4 F (36.9 C), temperature source Axillary, resp. rate 18, height 5\' 11"  (1.803 m), weight 73 kg, SpO2 98 %.        Intake/Output Summary (Last 24 hours) at 02/27/2020 1244 Last data filed at 02/27/2020 1100 Gross per 24 hour  Intake 3065.65 ml  Output 1945 ml  Net 1120.65 ml   Filed Weights   02/23/20 1911 02/24/20 1500  Weight: 72.6 kg 73 kg    Examination: General: Slightly disheveled adult male lying in bed in no acute distress  HEENT: [place]/AT, MM pink/moist, PERRL,  Neuro: Sleepy but will arouse to verbal stimuli and follow simple commands, left upper extremity flaccid, weak movement seen in all other extermities CV: s1s2 regular rate and rhythm, no murmur, rubs, or gallops,  PULM:  Clear to ascultation bilaterally, no  increased work of breathing, oxygen saturations 100% on RA GI: soft, bowel sounds active in all 4 quadrants, non-tender, non-distended Extremities: warm/dry, no edema  Skin: no rashes or lesions  Assessment & Plan:  Intraparenchymal right basal ganglia hemorrhage due to uncontrolled hypertension ICH score 0. Hypertensive emergency Remains critically ill due to alcohol withdrawal, complicated with delirium tremens, requiring titration of dexmedetomidine and frequent administration of benzodiazepines. Hyponatremia/hypokalemia Acute alcoholic hepatitis   Plan:  Have reloaded with phenobarbital to wean off dexmedetomidine. Continue frequent CIWA assessments with as needed Ativan as necessary. Some of his encephalopathy may be due to his hemorrhage. Resume home BP medications when cleared for oral diet  Wean Cleviprex for SBP goal less than 160 per neurology  Wean Precedex, utilize PRN Ativan per CIWA scale Continue Thiamin, folate, and multivitamin Nutrition and bowel regiment  Seizure precautions  Supplement electrolytes  Trend LFTs, improving  SLP/PT/OT evals    Best Practice:  Diet: NPO on tube feed.   Pain/Anxiety/Delirium protocol (if indicated): Precedex per protocol VAP protocol (if indicated): N/A. DVT prophylaxis: Per primary. GI prophylaxis: N/A. Glucose control: N/A. Mobility: Bedrest. Code Status: Full. Family Communication: Per primary. Disposition: ICU.  Labs   CBC: Recent Labs  Lab 02/23/20 2009 02/24/20 0325 02/25/20 0243 02/26/20 0522 02/27/20 0643  WBC 5.8 5.0 7.6 5.2 5.0  NEUTROABS 4.9  --   --   --   --   HGB 13.2 13.0 12.1* 11.3* 13.4  HCT 38.5* 38.7* 35.7* 34.0* 38.6*  MCV 96.0 96.0  97.5 96.6 94.8  PLT 209 222 194 196 198   Basic Metabolic Panel: Recent Labs  Lab 02/23/20 2009 02/24/20 0325 02/25/20 0243 02/26/20 0522 02/27/20 0643  NA 127* 130* 132* 129* 130*  K 4.1 3.8 3.5 3.2* 3.1*  CL 88* 91* 96* 93* 94*  CO2 26 26 21* 23 23    GLUCOSE 102* 94 81 91 102*  BUN 7 5* 8 7 8   CREATININE 0.53* 0.64 0.67 0.53* 0.52*  CALCIUM 9.7 9.9 9.3 9.2 9.2  MG  --  1.7  --  1.6* 1.7  PHOS  --  3.9  --  3.0 3.2   GFR: Estimated Creatinine Clearance: 126.7 mL/min (A) (by C-G formula based on SCr of 0.52 mg/dL (L)). Recent Labs  Lab 02/24/20 0325 02/25/20 0243 02/26/20 0522 02/27/20 0643  WBC 5.0 7.6 5.2 5.0   Liver Function Tests: Recent Labs  Lab 02/23/20 2009 02/24/20 0325 02/26/20 0522  AST 80* 73* 46*  ALT 46* 42 32  ALKPHOS 234* 211* 147*  BILITOT 1.7* 1.7* 2.0*  PROT 8.9* 8.5* 7.5  ALBUMIN 3.9 3.5 2.8*   No results for input(s): LIPASE, AMYLASE in the last 168 hours. Recent Labs  Lab 02/24/20 0327  AMMONIA 40*   ABG No results found for: PHART, PCO2ART, PO2ART, HCO3, TCO2, ACIDBASEDEF, O2SAT  Coagulation Profile: Recent Labs  Lab 02/23/20 2009  INR 1.1   Cardiac Enzymes: Recent Labs  Lab 02/23/20 2009  CKTOTAL 282   HbA1C: Hgb A1c MFr Bld  Date/Time Value Ref Range Status  02/25/2020 02:43 AM 5.1 4.8 - 5.6 % Final    Comment:    (NOTE) Pre diabetes:          5.7%-6.4%  Diabetes:              >6.4%  Glycemic control for   <7.0% adults with diabetes    CBG: Recent Labs  Lab 02/26/20 1934 02/26/20 2318 02/27/20 0323 02/27/20 0803 02/27/20 1205  GLUCAP 95 103* 111* 114* 111*     CRITICAL CARE Performed by: 02/29/20  Total critical care time: 35 minutes  Critical care time was exclusive of separately billable procedures and treating other patients.  Critical care was necessary to treat or prevent imminent or life-threatening deterioration.  Critical care was time spent personally by me on the following activities: development of treatment plan with patient and/or surrogate as well as nursing, discussions with consultants, evaluation of patient's response to treatment, examination of patient, obtaining history from patient or surrogate, ordering and performing  treatments and interventions, ordering and review of laboratory studies, ordering and review of radiographic studies, pulse oximetry and re-evaluation of patient's condition.  Lynnell Catalan, MD [facility] ICU Physician [facility]  Pager: [phone] Mobile: [phone] After hours: [phone].  02/27/2020, 12:46 PM      02/27/2020, 12:44 PM

## 2020-02-28 LAB — BASIC METABOLIC PANEL
Anion gap: 8 (ref 5–15)
BUN: 8 mg/dL (ref 6–20)
CO2: 22 mmol/L (ref 22–32)
Calcium: 9.2 mg/dL (ref 8.9–10.3)
Chloride: 100 mmol/L (ref 98–111)
Creatinine, Ser: 0.44 mg/dL — ABNORMAL LOW (ref 0.61–1.24)
GFR calc Af Amer: >60 mL/min (ref >60)
GFR calc non Af Amer: >60 mL/min (ref >60)
Glucose, Bld: 114 mg/dL — ABNORMAL HIGH (ref 70–99)
Potassium: 3.8 mmol/L (ref 3.5–5.1)
Sodium: 130 mmol/L — ABNORMAL LOW (ref 135–145)

## 2020-02-28 LAB — CBC
HCT: 35.3 % — ABNORMAL LOW (ref 39.0–52.0)
Hemoglobin: 12 g/dL — ABNORMAL LOW (ref 13.0–17.0)
MCH: 32.4 pg (ref 26.0–34.0)
MCHC: 34 g/dL (ref 30.0–36.0)
MCV: 95.4 fL (ref 80.0–100.0)
Platelets: 239 10*3/uL (ref 150–400)
RBC: 3.7 MIL/uL — ABNORMAL LOW (ref 4.22–5.81)
RDW: 13.4 % (ref 11.5–15.5)
WBC: 3.8 10*3/uL — ABNORMAL LOW (ref 4.0–10.5)
nRBC: 0 % (ref 0.0–0.2)

## 2020-02-28 LAB — GLUCOSE, CAPILLARY
Glucose-Capillary: 109 mg/dL — ABNORMAL HIGH (ref 70–99)
Glucose-Capillary: 110 mg/dL — ABNORMAL HIGH (ref 70–99)
Glucose-Capillary: 112 mg/dL — ABNORMAL HIGH (ref 70–99)
Glucose-Capillary: 120 mg/dL — ABNORMAL HIGH (ref 70–99)
Glucose-Capillary: 121 mg/dL — ABNORMAL HIGH (ref 70–99)
Glucose-Capillary: 89 mg/dL (ref 70–99)

## 2020-02-28 MED ORDER — LORAZEPAM 2 MG/ML IJ SOLN
2.0000 mg | INTRAMUSCULAR | Status: DC | PRN
  Administered 2020-02-28 – 2020-02-29 (×4): 2 mg via INTRAVENOUS
  Filled 2020-02-28 (×4): qty 1

## 2020-02-28 MED ORDER — QUETIAPINE FUMARATE 25 MG PO TABS
25.0000 mg | ORAL_TABLET | Freq: Two times a day (BID) | ORAL | Status: DC
  Administered 2020-02-28 – 2020-03-05 (×13): 25 mg
  Filled 2020-02-28 (×15): qty 1

## 2020-02-28 NOTE — Progress Notes (Signed)
STROKE TEAM PROGRESS NOTE   INTERVAL HISTORY It appears patient has developed significant agitation and irritation.  He is obviously still in alcohol withdrawal with delirium tremens.  Focal deficits seems unchanged however.  Vitals:   02/28/20 0600 02/28/20 0700 02/28/20 0720 02/28/20 0800  BP: 124/82 (!) 144/92 (!) 144/92 (!) 135/99  Pulse: 90 94 94 97  Resp: '18 18  19  ' Temp:      TempSrc:      SpO2:      Weight:      Height:       CBC:  Recent Labs  Lab 02/23/20 2009 02/24/20 0325 02/27/20 0643 02/28/20 0414  WBC 5.8   < > 5.0 3.8*  NEUTROABS 4.9  --   --   --   HGB 13.2   < > 13.4 12.0*  HCT 38.5*   < > 38.6* 35.3*  MCV 96.0   < > 94.8 95.4  PLT 209   < > 198 239   < > = values in this interval not displayed.   Basic Metabolic Panel:  Recent Labs  Lab 02/26/20 0522 02/26/20 0522 02/27/20 0643 02/28/20 0414  NA 129*   < > 130* 130*  K 3.2*   < > 3.1* 3.8  CL 93*   < > 94* 100  CO2 23   < > 23 22  GLUCOSE 91   < > 102* 114*  BUN 7   < > 8 8  CREATININE 0.53*   < > 0.52* 0.44*  CALCIUM 9.2   < > 9.2 9.2  MG 1.6*  --  1.7  --   PHOS 3.0  --  3.2  --    < > = values in this interval not displayed.   Lipid Panel:  Recent Labs  Lab 02/26/20 0522  CHOL 140  TRIG 78  HDL 53  CHOLHDL 2.6  VLDL 16  LDLCALC 71   HgbA1c:  Recent Labs  Lab 02/25/20 0243  HGBA1C 5.1   Urine Drug Screen:  Recent Labs  Lab 02/24/20 1839  LABOPIA NONE DETECTED  COCAINSCRNUR NONE DETECTED  LABBENZ POSITIVE*  AMPHETMU NONE DETECTED  THCU NONE DETECTED  LABBARB POSITIVE*    Alcohol Level  Recent Labs  Lab 02/23/20 2009  ETH <10    IMAGING past 24 hours  No results found.  PHYSICAL EXAM   Temp:  [98 F (36.7 C)-99.4 F (37.4 C)] 98.9 F (37.2 C) (08/01 0400) Pulse Rate:  [56-109] 97 (08/01 0800) Resp:  [14-27] 19 (08/01 0800) BP: (98-163)/(61-116) 135/99 (08/01 0800) SpO2:  [97 %-100 %] 98 % (07/31 1800)  General - Well nourished, well developed; he is  more tremulous this morning.  Ophthalmologic - fundi not visualized due to noncooperation.  Cardiovascular - Regular rhythm and rate.  Neuro -  Somewhat drowsy but opens eyes spontaneously.  Orientated to self, people, place and age, not oriented to time.  Paucity of speech, severe dysarthria, follow simple midline and peripheral commands.  Blinking to visual threat bilaterally, PERRL, EOMI. Left facial droop. Tongue midline. LUE 3/5 and LLE withdraw 4/5. RUE at least 4/5. Sensation subjectively symmetrical, coordination not corporative and gait not tested.   ASSESSMENT/PLAN [name] is a 39 y.o. male with history of Etoh abuse and HTN presenting to [facility] with L sided weakness, R gaze preference in DTs.   Stroke: R basal ganglia ICH likely secondary to HTN   CT head R basal ganglia hemorrhage  w/ R->L midline shift   CTA head & neck Unremarkable   Repeat CT head stable hemorrhage   CT repeat 7/30 stable hematoma, mildly increased midline shift and mass-effect  2D Echo EF 50-55%. No source of embolus   EEG 7/28 - excessive beta, generalized slowing  LDL 71   HgbA1c 5.1  UDS positive for benzo and barbiturates  VTE prophylaxis - heparin subq   No antithrombotic prior to admission, now on No antithrombotic given hemorrhage   Therapy recommendations CIR  Disposition:  pending   Hypertensive emergency  Home meds:  None listed . BP stable on the high end . SBP goal < 160  Weaned off cleviprex  On norvasc 10 and lisinopril 20 . Long-term BP goal normotensive  ETOH withdraw Delirium Tremens   Ativan for DTs  CIWA protocol - on ativan scheduled  alcohol level <10  On precedex  Thiamine/folate/MVI  advised to drink no more than 2 drink(s) a day  As per mom, pt drinks 12 beers average per day PTA  Tachypnea, resolved  Likely related to alcohol withdraw and sedation  O2 sat WNL  CCM on board  Improved  Dysphagia  Severe Protein  Malnutrition  Did not pass swallow  NPO now  Albumin 2.8  Cortrak 7/30  On IVF @ 50  TF @ 60  Tobacco abuse  Quit smoking 2 mos ago  Smoking cessation counseling will be provided to continue cessation   Other Stroke Risk Factors  Substance abuse  - UDS + benzo + barbituate  Other Active Problems  Acute alcohol hepatitis, transaminitis AST/ALT 80/46->73/42->46/32, Alk Phos 234->211->147 (last checked 02/26/20)  Hyponatremia, Na 127-130-132-129->130  Hypokalemia, K 3.5 - 3.2 - supplement - 3.8  Hypomagnesemia, Mg 1.6 - supplement - 1.7  Hospital day # 5  This patient is critically ill due to stroke post procedure and at significant risk of neurological worsening, death form severe anemia, bleeding, recurrent stroke, intracranial stenosis. This patient's care requires constant monitoring of vital signs, hemodynamics, respiratory and cardiac monitoring, review of multiple databases, neurological assessment, other specialists and medical decision making of high complexity. I spent 35 minutes of neurocritical care time in the care of this patient.       To contact Stroke Continuity provider, please refer to [email or web address] After hours, contact General Neurology

## 2020-02-28 NOTE — Progress Notes (Signed)
NAME:  Tommy Garcia, MRN:  [id number], DOB:  1980/09/05, LOS: 5 ADMISSION DATE:  02/23/2020, CONSULTATION DATE:  02/24/20 REFERRING MD:  Wilford Corner  CHIEF COMPLAINT:  DT's   Brief History   Tommy Garcia is a 39 y.o. male who was transferred from [facility], admitted 7/27 with BG ICH, hypertensive emergency and DT's. PCCM asked to assist with management of DT's.  Past Medical History   Past Medical History:  Diagnosis Date  . Alcohol abuse   . Hypertension    Past Surgical History:  Procedure Laterality Date  . HAND TENDON SURGERY Right   . OPEN REDUCTION INTERNAL FIXATION (ORIF) DISTAL RADIAL FRACTURE Left 12/26/2017   Procedure: OPEN REDUCTION INTERNAL FIXATION (ORIF) DISTAL RADIAL FRACTURE,;  Surgeon: Betha Loa, MD;  Location:  SURGERY CENTER;  Service: Orthopedics;  Laterality: Left;  . WRIST SURGERY Left around 1995    Significant Hospital Events   7/27 > transferred from [facility].  Consults:  Neurosurgery, PCCM.  Procedures:  None.  Significant Diagnostic Tests:  CT head 7/27 > right BG hemorrhage. CTA head 7/27 > neg. CT head 7/28 > Size stable 16 cc hematoma at the right basal ganglia. EEG 7/28 > no seizure, consistent with encephalopathy  Micro Data:  COVID 7/27 > neg.  Antimicrobials:  None.   Interim history/subjective:  Off Precedex. Oriented at times according to RN  Objective:  Blood pressure (!) 135/99, pulse 97, temperature 99.1 F (37.3 C), temperature source Axillary, resp. rate 19, height 5\' 11"  (1.803 m), weight 73 kg, SpO2 98 %.        Intake/Output Summary (Last 24 hours) at 02/28/2020 0919 Last data filed at 02/28/2020 0800 Gross per 24 hour  Intake 2476.94 ml  Output 1975 ml  Net 501.94 ml   Filed Weights   02/23/20 1911 02/24/20 1500  Weight: 72.6 kg 73 kg    Examination: General: Slightly disheveled adult male lying in bed in no acute distress  HEENT: [place]/AT, MM pink/moist, PERRL,  Neuro: Awake and moving all limbs except for LUE.  Word salad. Still confabulating.  CV: s1s2 regular rate and rhythm, no murmur, rubs, or gallops,  PULM:  Clear to ascultation bilaterally, no increased work of breathing, oxygen saturations 100% on RA GI: soft, bowel sounds active in all 4 quadrants, non-tender, non-distended Extremities: warm/dry, no edema  Skin: no rashes or lesions  Assessment & Plan:  Intraparenchymal right basal ganglia hemorrhage due to uncontrolled hypertension ICH score 0. Hypertensive emergency Alcohol withdrawal, complicated with delirium tremens-now resolved Acute delirium secondary to cerebral hemorrhage. Acute alcoholic hepatitis   Plan:  Keep off Precedex as now moving too far out for alcohol withdraw and patient has received repeated doses of GABAergic drugs Stop CIWA assessments. Continue as needed lorazepam for agitation. We will add Seroquel. Some of his encephalopathy may be due to his hemorrhage. Continue current oral blood pressure medications.   Best Practice:  Diet: NPO on tube feed.   Pain/Anxiety/Delirium protocol (if indicated): Transition to enteral/intermittent medication. VAP protocol (if indicated): N/A. DVT prophylaxis: Per primary. GI prophylaxis: N/A. Glucose control: N/A. Mobility: Bedrest. Code Status: Full. Family Communication: Per primary. Disposition: ICU - behavior remains difficult to control and remains too heavy for PCU.  Labs   CBC: Recent Labs  Lab 02/23/20 2009 02/23/20 2009 02/24/20 0325 02/25/20 0243 02/26/20 0522 02/27/20 0643 02/28/20 0414  WBC 5.8   < > 5.0 7.6 5.2 5.0 3.8*  NEUTROABS 4.9  --   --   --   --   --   --  HGB 13.2   < > 13.0 12.1* 11.3* 13.4 12.0*  HCT 38.5*   < > 38.7* 35.7* 34.0* 38.6* 35.3*  MCV 96.0   < > 96.0 97.5 96.6 94.8 95.4  PLT 209   < > 222 194 196 198 239   < > = values in this interval not displayed.   Basic Metabolic Panel: Recent Labs  Lab 02/24/20 0325 02/25/20 0243 02/26/20 0522 02/27/20 0643 02/28/20 0414    NA 130* 132* 129* 130* 130*  K 3.8 3.5 3.2* 3.1* 3.8  CL 91* 96* 93* 94* 100  CO2 26 21* 23 23 22   GLUCOSE 94 81 91 102* 114*  BUN 5* 8 7 8 8   CREATININE 0.64 0.67 0.53* 0.52* 0.44*  CALCIUM 9.9 9.3 9.2 9.2 9.2  MG 1.7  --  1.6* 1.7  --   PHOS 3.9  --  3.0 3.2  --    GFR: Estimated Creatinine Clearance: 126.7 mL/min (A) (by C-G formula based on SCr of 0.44 mg/dL (L)). Recent Labs  Lab 02/25/20 0243 02/26/20 0522 02/27/20 0643 02/28/20 0414  WBC 7.6 5.2 5.0 3.8*   Liver Function Tests: Recent Labs  Lab 02/23/20 2009 02/24/20 0325 02/26/20 0522  AST 80* 73* 46*  ALT 46* 42 32  ALKPHOS 234* 211* 147*  BILITOT 1.7* 1.7* 2.0*  PROT 8.9* 8.5* 7.5  ALBUMIN 3.9 3.5 2.8*   No results for input(s): LIPASE, AMYLASE in the last 168 hours. Recent Labs  Lab 02/24/20 0327  AMMONIA 40*   ABG No results found for: PHART, PCO2ART, PO2ART, HCO3, TCO2, ACIDBASEDEF, O2SAT  Coagulation Profile: Recent Labs  Lab 02/23/20 2009  INR 1.1   Cardiac Enzymes: Recent Labs  Lab 02/23/20 2009  CKTOTAL 282   HbA1C: Hgb A1c MFr Bld  Date/Time Value Ref Range Status  02/25/2020 02:43 AM 5.1 4.8 - 5.6 % Final    Comment:    (NOTE) Pre diabetes:          5.7%-6.4%  Diabetes:              >6.4%  Glycemic control for   <7.0% adults with diabetes    CBG: Recent Labs  Lab 02/27/20 1550 02/27/20 2002 02/27/20 2317 02/28/20 0322 02/28/20 0740  GLUCAP 116* 114* 121* 112* 109*     04/29/20, MD [facility] ICU Physician [facility]  Pager: [phone] Mobile: [phone] After hours: [phone].  02/28/2020, 9:19 AM

## 2020-02-28 NOTE — Progress Notes (Signed)
Inpatient Rehab Admissions Coordinator:  Notified pt and father, Loraine Leriche, that pt is not eligible for coverage of CIR through Medicaid. TOC made aware.  Father and pt indicated that the cost of CIR is more than they can afford.  I will sign off on this pt.   Wolfgang Phoenix, MS, CCC-SLP Admissions Coordinator [phone]

## 2020-02-29 LAB — BASIC METABOLIC PANEL
Anion gap: 9 (ref 5–15)
BUN: 7 mg/dL (ref 6–20)
CO2: 23 mmol/L (ref 22–32)
Calcium: 9.3 mg/dL (ref 8.9–10.3)
Chloride: 96 mmol/L — ABNORMAL LOW (ref 98–111)
Creatinine, Ser: 0.51 mg/dL — ABNORMAL LOW (ref 0.61–1.24)
GFR calc Af Amer: >60 mL/min (ref >60)
GFR calc non Af Amer: >60 mL/min (ref >60)
Glucose, Bld: 100 mg/dL — ABNORMAL HIGH (ref 70–99)
Potassium: 4.1 mmol/L (ref 3.5–5.1)
Sodium: 128 mmol/L — ABNORMAL LOW (ref 135–145)

## 2020-02-29 LAB — GLUCOSE, CAPILLARY
Glucose-Capillary: 101 mg/dL — ABNORMAL HIGH (ref 70–99)
Glucose-Capillary: 104 mg/dL — ABNORMAL HIGH (ref 70–99)
Glucose-Capillary: 106 mg/dL — ABNORMAL HIGH (ref 70–99)
Glucose-Capillary: 109 mg/dL — ABNORMAL HIGH (ref 70–99)
Glucose-Capillary: 122 mg/dL — ABNORMAL HIGH (ref 70–99)
Glucose-Capillary: 122 mg/dL — ABNORMAL HIGH (ref 70–99)

## 2020-02-29 LAB — CBC
HCT: 34.3 % — ABNORMAL LOW (ref 39.0–52.0)
Hemoglobin: 11.7 g/dL — ABNORMAL LOW (ref 13.0–17.0)
MCH: 33.1 pg (ref 26.0–34.0)
MCHC: 34.1 g/dL (ref 30.0–36.0)
MCV: 97.2 fL (ref 80.0–100.0)
Platelets: 281 10*3/uL (ref 150–400)
RBC: 3.53 MIL/uL — ABNORMAL LOW (ref 4.22–5.81)
RDW: 13.8 % (ref 11.5–15.5)
WBC: 5.5 10*3/uL (ref 4.0–10.5)
nRBC: 0 % (ref 0.0–0.2)

## 2020-02-29 MED ORDER — SENNOSIDES-DOCUSATE SODIUM 8.6-50 MG PO TABS
1.0000 | ORAL_TABLET | Freq: Two times a day (BID) | ORAL | Status: DC
  Administered 2020-02-29 – 2020-03-05 (×10): 1
  Filled 2020-02-29 (×10): qty 1

## 2020-02-29 NOTE — Progress Notes (Signed)
STROKE TEAM PROGRESS NOTE   INTERVAL HISTORY Patient is lying in bed.  He is in restraints.  He is slightly tremulous and confused with is awake and follows commands.  He still has left hemiplegia can withdraw the left leg slightly.  Blood pressure adequately controlled.  Precedex drip has been discontinued this morning.  Vitals:   02/29/20 0600 02/29/20 0700 02/29/20 0800 02/29/20 0900  BP: (!) 149/100 (!) 157/103 (!) 149/102 (!) 142/101  Pulse: 94 91 101 (!) 123  Resp: 21 21 (!) 26 20  Temp:   98.4 F (36.9 C)   TempSrc:   Oral   SpO2:  97% 97%   Weight:      Height:       CBC:  Recent Labs  Lab 02/23/20 2009 02/24/20 0325 02/28/20 0414 02/29/20 0646  WBC 5.8   < > 3.8* 5.5  NEUTROABS 4.9  --   --   --   HGB 13.2   < > 12.0* 11.7*  HCT 38.5*   < > 35.3* 34.3*  MCV 96.0   < > 95.4 97.2  PLT 209   < > 239 281   < > = values in this interval not displayed.   Basic Metabolic Panel:  Recent Labs  Lab 02/26/20 0522 02/26/20 0522 02/27/20 8676 02/27/20 7209 02/28/20 0414 02/29/20 0646  NA 129*   < > 130*   < > 130* 128*  K 3.2*   < > 3.1*   < > 3.8 4.1  CL 93*   < > 94*   < > 100 96*  CO2 23   < > 23   < > 22 23  GLUCOSE 91   < > 102*   < > 114* 100*  BUN 7   < > 8   < > 8 7  CREATININE 0.53*   < > 0.52*   < > 0.44* 0.51*  CALCIUM 9.2   < > 9.2   < > 9.2 9.3  MG 1.6*  --  1.7  --   --   --   PHOS 3.0  --  3.2  --   --   --    < > = values in this interval not displayed.    IMAGING past 24 hours No results found.   PHYSICAL EXAM    General - Well nourished, well developed middle-aged male; he is slightly tremulous this morning.  Ophthalmologic - fundi not visualized due to noncooperation.  Cardiovascular - Regular rhythm and rate.  Neuro -awake and interactive..  Oriented to self, people, placenot oriented to time.  Paucity of speech, moderate dysarthria, follow simple midline and peripheral commands.  Blinking to visual threat bilaterally, PERRL, EOMI. Left  facial droop. Tongue midline. LUE 3/5 and LLE withdraw 4/5. RUE at least 4/5. Sensation subjectively symmetrical, coordination not corporative and gait not tested.   ASSESSMENT/PLAN [name] is a 39 y.o. male with history of Etoh abuse and HTN presenting to [facility] with L sided weakness, R gaze preference in DTs.   Stroke: R basal ganglia ICH likely secondary to HTN   CT head R basal ganglia hemorrhage w/ R->L midline shift   CTA head & neck Unremarkable   Repeat CT head stable hemorrhage   CT repeat 7/30 stable hematoma, mildly increased midline shift and mass-effect  2D Echo EF 50-55%. No source of embolus   EEG 7/28 - excessive beta, generalized slowing  LDL 71   HgbA1c  5.1  UDS positive for benzo and barbiturates  VTE prophylaxis - heparin subq   No antithrombotic prior to admission, now on No antithrombotic given hemorrhage   Therapy recommendations CIR  Disposition:  pending   Transfer to the floor, transition to [facility] as attending 8/3  Hypertensive emergency  Home meds:  None listed  Weaned off cleviprex  SBP goal < 160  On norvasc 10 and lisinopril 20 . Long-term BP goal normotensive  ETOH withdraw Delirium Tremens   Ativan for DTs  alcohol level <10  CIWA protocol - on ativan prn  Off precedex  Thiamine/folate/MVI  advised to drink no more than 2 drink(s) a day  As per mom, pt drinks 12 beers average per day PTA  seroquel 25 bid  Tachypnea, resolved  Likely related to alcohol withdraw and sedation  O2 sat WNL  CCM on board  Improved  Dysphagia  Severe Protein Malnutrition  Did not pass swallow  NPO now  Albumin 2.8  Cortrak 7/30  On IVF @ 50  TF @ 60  Tobacco abuse  Quit smoking 2 mos ago  Smoking cessation counseling will be provided to continue cessation   Other Stroke Risk Factors  Substance abuse  - UDS + benzo + barbituate  Other Active Problems  Acute alcohol hepatitis,  transaminitis AST/ALT 80/46->73/42->46/32, Alk Phos 234->211->147 (last checked 02/26/20)  Hyponatremia, Na 127-130-132-129->130->128  Hypokalemia, K 3.5 - 3.2 - supplement - 3.8->4.1  Hypomagnesemia, Mg 1.6 - supplement - 1.7    Hospital day # 6 Patient is likely outside the time window for alcohol withdrawal at this time.  Recommend transfer to neurology floor bed for ongoing care.  Continue ongoing therapies and transfer to rehab versus SNF when bed available.  Will consult medical hospitalist team to help manage medical issues and alcohol withdrawal and resume care.  Discussed with Dr. Lynetta Mare critical care medicine.  Greater than 50% time during the 35-minute visit was spent on counseling and coordination of care and discussion with care team about his alcohol withdrawal, intracerebral hemorrhage and answering questions  Antony Contras, MD  To contact Stroke Continuity provider, please refer to [email or web address] After hours, contact General Neurology

## 2020-02-29 NOTE — Progress Notes (Signed)
Occupational Therapy Treatment Patient Details Name: Tommy Garcia MRN: [id number] DOB: Nov 30, 1980 Today's Date: 02/29/2020    History of present illness Tommy Garcia is a 39 y.o. male who was transferred from [facility], admitted 7/27 with BG ICH, hypertensive emergency and DT's.   OT comments  This 39 yo male seen today to focus on following commands, visual tasks, sitting balance, ADLs, and standing. Pt hard to understand at times (garbled speech), pt continues to have vision issues (right and left) as well as tremors in Bil UEs and decreased sitting balance. He will continue to benefit from acute OT with follow up at at [facility]  Follow Up Recommendations  SNF (denied by rehab)    Equipment Recommendations  Other (comment) (TBD next venue)    Recommendations for Other Services Rehab consult    Precautions / Restrictions Precautions Precautions: Fall Precaution Comments: wrist restraints, mitts, posey belt, NG tube, watch HR Restrictions Weight Bearing Restrictions: No       Mobility Bed Mobility Overal bed mobility: Needs Assistance Bed Mobility: Supine to Sit;Sit to Supine;Rolling Rolling: Mod assist   Supine to sit: Total assist;+2 for physical assistance;HOB elevated Sit to supine: Total assist;+2 for physical assistance;HOB elevated   General bed mobility comments: Total A for bed mobility, no initiation or assist with movement. Tremulous once EOB.  Transfers Overall transfer level: Needs assistance Equipment used: 2 person hand held assist (back of recliner) Transfers: Sit to/from Stand Sit to Stand: +2 physical assistance;Mod assist         General transfer comment: Assist of 2 to stand from EOB, manually placing RUE on recliner. Not able to get fully upright, bil knees flexed and increased tremors throughout entire body. "I need to sit now" needing manual assist to lower onto bed. HR up to 147 bpm.    Balance Overall balance assessment: Needs  assistance Sitting-balance support: Feet supported;No upper extremity supported Sitting balance-Leahy Scale: Poor Sitting balance - Comments: Varying assist needed anywhere from Min A-total A with posterior lean. Tremors present. Difficulty washing face and brushing hair with RUE due to poor coordindation and tremors.   Standing balance support: During functional activity Standing balance-Leahy Scale: Poor Standing balance comment: Assist of 2 to stand from EOB but not able to get fully upright, tremors worsened in standing. Flexed at knees/hips. Holding onto back of chair for support. Limited standing tolerance.                           ADL either performed or assessed with clinical judgement   ADL Overall ADL's : Needs assistance/impaired     Grooming: Wash/dry face Grooming Details (indicate cue type and reason): Max A hand over hand with RUE due to tremors, sitting EOB supported from behind (min A-total A(pushing))                               General ADL Comments: Bil UE tremots make it really hard for him to do any of his ADLs by himself     Vision   Vision Assessment?: Vision impaired- to be further tested in functional context Additional Comments: With therapist standing in front of him and to his right (while pt sitting EOB), he could tell her consistently how many fingers she was holding up, but he could not find the calendar with big 2 on it when asked (on his right) until she stood  close to it. On his left he was able to find the TV and clock (able to tell us the time as well)          Cognition Arousal/Alertness: Lethargic Behavior During Therapy: Impulsive;Restless Overall Cognitive Status: Impaired/Different from baseline Area of Impairment: Orientation;Attention;Following commands;Safety/judgement;Awareness;Problem solving                 Orientation Level: Disoriented to;Place;Time;Situation Current Attention Level: Focused    Following Commands: Follows one step commands inconsistently Safety/Judgement: Decreased awareness of safety;Decreased awareness of deficits Awareness: Intellectual Problem Solving: Slow processing;Difficulty sequencing;Requires verbal cues;Requires tactile cues General Comments: Thinks he is at home; States his brother's bday when asked his bday. "July" when cued to find calendar in front of pt. Mumbled speech that is dificult to comprehend at times. Left inattention noted. Favors right gaze but able to gaze left with max cues but not sustain. Noted to have some visual deficits as well on right side? Able to read clock correctly. Cues to attend to task. Poor awareness of deficits. "I am a tall glass of beer."              General Comments HR up to 147 bpm with activity.    Pertinent Vitals/ Pain       Pain Assessment: Faces Faces Pain Scale: Hurts a little bit Pain Location: "all my joints" Pain Descriptors / Indicators: Aching Pain Intervention(s): Monitored during session;Repositioned;Limited activity within patient's tolerance         Frequency  Min 2X/week        Progress Toward Goals  OT Goals(current goals can now be found in the care plan section)  Progress towards OT goals: Progressing toward goals  Acute Rehab OT Goals Patient Stated Goal: unable to state OT Goal Formulation: Patient unable to participate in goal setting Time For Goal Achievement: 03/11/20 Potential to Achieve Goals: Good  Plan Discharge plan remains appropriate    Co-evaluation    PT/OT/SLP Co-Evaluation/Treatment: Yes Reason for Co-Treatment: Necessary to address cognition/behavior during functional activity;For patient/therapist safety;To address functional/ADL transfers PT goals addressed during session: Mobility/safety with mobility;Balance;Strengthening/ROM OT goals addressed during session: ADL's and self-care;Strengthening/ROM      AM-PAC OT "6 Clicks" Daily Activity     Outcome  Measure   Help from another person eating meals?: Total Help from another person taking care of personal grooming?: A Lot Help from another person toileting, which includes using toliet, bedpan, or urinal?: Total Help from another person bathing (including washing, rinsing, drying)?: Total Help from another person to put on and taking off regular upper body clothing?: Total Help from another person to put on and taking off regular lower body clothing?: Total 6 Click Score: 7    End of Session Equipment Utilized During Treatment: Gait belt  OT Visit Diagnosis: Unsteadiness on feet (R26.81);Cognitive communication deficit (R41.841);Pain;Muscle weakness (generalized) (M62.81);Other symptoms and signs involving cognitive function Symptoms and signs involving cognitive functions: Other Nontraumatic ICH Pain - part of body:  (all my joints)   Activity Tolerance Patient tolerated treatment well   Patient Left in bed;with call bell/phone within reach;with bed alarm set;with restraints reapplied   Nurse Communication          Time: 3086-5784 OT Time Calculation (min): 25 min  Charges: OT General Charges $OT Visit: 1 Visit OT Treatments $Self Care/Home Management : 8-22 mins  Ignacia Palma, OTR/L Acute [employer] Pager [phone] Office [phone]     Evette Georges 02/29/2020, 12:58 PM

## 2020-02-29 NOTE — Progress Notes (Signed)
NAME:  DOUG BUCKLIN, MRN:  [id number], DOB:  Feb 01, 1981, LOS: 6 ADMISSION DATE:  02/23/2020, CONSULTATION DATE:  02/24/20 REFERRING MD:  Wilford Corner  CHIEF COMPLAINT:  DT's   Brief History   Tommy Garcia is a 39 y.o. male who was transferred from [facility], admitted 7/27 with BG ICH, hypertensive emergency and DT's. PCCM asked to assist with management of DT's.  Past Medical History   Past Medical History:  Diagnosis Date  . Alcohol abuse   . Hypertension    Past Surgical History:  Procedure Laterality Date  . HAND TENDON SURGERY Right   . OPEN REDUCTION INTERNAL FIXATION (ORIF) DISTAL RADIAL FRACTURE Left 12/26/2017   Procedure: OPEN REDUCTION INTERNAL FIXATION (ORIF) DISTAL RADIAL FRACTURE,;  Surgeon: Betha Loa, MD;  Location: [facility];  Service: Orthopedics;  Laterality: Left;  . WRIST SURGERY Left around 1995    Significant Hospital Events   7/27 > transferred from [facility].  Consults:  Neurosurgery, PCCM.  Procedures:  None.  Significant Diagnostic Tests:  CT head 7/27 > right BG hemorrhage. CTA head 7/27 > neg. CT head 7/28 > Size stable 16 cc hematoma at the right basal ganglia. EEG 7/28 > no seizure, consistent with encephalopathy  Micro Data:  COVID 7/27 > neg.  Antimicrobials:  None.   Interim history/subjective:  Off Precedex. Oriented at times according to RN.    Objective:  Blood pressure (!) 149/100, pulse 94, temperature 98.4 F (36.9 C), temperature source Oral, resp. rate 21, height 5\' 11"  (1.803 m), weight 75 kg, SpO2 97 %.        Intake/Output Summary (Last 24 hours) at 02/29/2020 0859 Last data filed at 02/29/2020 0500 Gross per 24 hour  Intake 2768.01 ml  Output 2125 ml  Net 643.01 ml   Filed Weights   02/23/20 1911 02/24/20 1500 02/29/20 0500  Weight: 72.6 kg 73 kg 75 kg    Examination: General: Slightly disheveled adult male lying in bed in no acute distress  HEENT: [place]/AT, MM pink/moist, PERRL,  Neuro: Awake and moving all  limbs except for LUE. Word salad. Still confabulating.  CV: s1s2 regular rate and rhythm, no murmur, rubs, or gallops,  PULM:  Clear to ascultation bilaterally, no increased work of breathing, oxygen saturations 100% on RA GI: soft, bowel sounds active in all 4 quadrants, non-tender, non-distended Extremities: warm/dry, no edema  Skin: no rashes or lesions  Assessment & Plan:  Intraparenchymal right basal ganglia hemorrhage due to uncontrolled hypertension ICH score 0. Hypertensive emergency Alcohol withdrawal, complicated with delirium tremens-now resolved Continued tremulousness - possible cerebellar injury from alcoholism.  Acute delirium secondary to cerebral hemorrhage. Acute alcoholic hepatitis   Plan:  Keep off Precedex as now moving too far out for alcohol withdraw and patient has received repeated doses of GABAergic drugs Stop CIWA assessments. Continue as needed lorazepam for agitation. We will add Seroquel. Some of his encephalopathy may be due to his hemorrhage. Continue current oral blood pressure medications.   Best Practice:  Diet: NPO on tube feed.   Pain/Anxiety/Delirium protocol (if indicated): Transition to enteral/intermittent medication. VAP protocol (if indicated): N/A. DVT prophylaxis: Per primary. GI prophylaxis: N/A. Glucose control: N/A. Mobility: Bedrest. Code Status: Full. Family Communication: Per primary. Disposition: Appropriate for transfer.    Labs   CBC: Recent Labs  Lab 02/23/20 2009 02/24/20 0325 02/25/20 0243 02/26/20 0522 02/27/20 0643 02/28/20 0414 02/29/20 0646  WBC 5.8   < > 7.6 5.2 5.0 3.8* 5.5  NEUTROABS 4.9  --   --   --   --   --   --  HGB 13.2   < > 12.1* 11.3* 13.4 12.0* 11.7*  HCT 38.5*   < > 35.7* 34.0* 38.6* 35.3* 34.3*  MCV 96.0   < > 97.5 96.6 94.8 95.4 97.2  PLT 209   < > 194 196 198 239 281   < > = values in this interval not displayed.   Basic Metabolic Panel: Recent Labs  Lab 02/24/20 0325  02/24/20 0325 02/25/20 0243 02/26/20 0522 02/27/20 0643 02/28/20 0414 02/29/20 0646  NA 130*   < > 132* 129* 130* 130* 128*  K 3.8   < > 3.5 3.2* 3.1* 3.8 4.1  CL 91*   < > 96* 93* 94* 100 96*  CO2 26   < > 21* 23 23 22 23   GLUCOSE 94   < > 81 91 102* 114* 100*  BUN 5*   < > 8 7 8 8 7   CREATININE 0.64   < > 0.67 0.53* 0.52* 0.44* 0.51*  CALCIUM 9.9   < > 9.3 9.2 9.2 9.2 9.3  MG 1.7  --   --  1.6* 1.7  --   --   PHOS 3.9  --   --  3.0 3.2  --   --    < > = values in this interval not displayed.   GFR: Estimated Creatinine Clearance: 130.2 mL/min (A) (by C-G formula based on SCr of 0.51 mg/dL (L)). Recent Labs  Lab 02/26/20 0522 02/27/20 0643 02/28/20 0414 02/29/20 0646  WBC 5.2 5.0 3.8* 5.5   Liver Function Tests: Recent Labs  Lab 02/23/20 2009 02/24/20 0325 02/26/20 0522  AST 80* 73* 46*  ALT 46* 42 32  ALKPHOS 234* 211* 147*  BILITOT 1.7* 1.7* 2.0*  PROT 8.9* 8.5* 7.5  ALBUMIN 3.9 3.5 2.8*   No results for input(s): LIPASE, AMYLASE in the last 168 hours. Recent Labs  Lab 02/24/20 0327  AMMONIA 40*   ABG No results found for: PHART, PCO2ART, PO2ART, HCO3, TCO2, ACIDBASEDEF, O2SAT  Coagulation Profile: Recent Labs  Lab 02/23/20 2009  INR 1.1   Cardiac Enzymes: Recent Labs  Lab 02/23/20 2009  CKTOTAL 282   HbA1C: Hgb A1c MFr Bld  Date/Time Value Ref Range Status  02/25/2020 02:43 AM 5.1 4.8 - 5.6 % Final    Comment:    (NOTE) Pre diabetes:          5.7%-6.4%  Diabetes:              >6.4%  Glycemic control for   <7.0% adults with diabetes    CBG: Recent Labs  Lab 02/28/20 1537 02/28/20 1952 02/28/20 2337 02/29/20 0333 02/29/20 0754  GLUCAP 89 121* 120* 104* 101*     04/30/20, MD [facility] ICU Physician [facility] Critical [facility]  Pager: [phone] Mobile: [phone] After hours: [phone].  02/29/2020, 8:59 AM

## 2020-02-29 NOTE — Progress Notes (Signed)
  Speech Language Pathology Treatment: Dysphagia;Cognitive-Linquistic  Patient Details Name: Tommy Garcia MRN: [id number] DOB: 12/21/1980 Today's Date: 02/29/2020 Time: 4008-6761 SLP Time Calculation (min) (ACUTE ONLY): 10 min  Assessment / Plan / Recommendation Clinical Impression  Pt is alert but significantly dysarthric and disoriented. SLP provided Mod cues throughout testing to facilitate intelligibility of speech, with pt at times able to slow his rate enough to become more intelligible up to the phrase level. SLP also provided PO trials with reduced bolus awareness requiring Min-Mod cues. He seems to consistently elicit a swallow, with coughing noted primarily with ice and liquids compared to purees. His mentation still does not support a PO diet, but will continue to follow for readiness to complete instrumental testing.    HPI HPI: 39 y.o. male with history of Etoh abuse, HTN presenting to [facility] with L sided weakness, R gaze preference in DTs.  Transferred to [facility]; dx right basal ganglia ICH likely secondary to HTN.      SLP Plan  Continue with current plan of care       Recommendations  Diet recommendations: NPO Medication Administration: Via alternative means                Oral Care Recommendations: Oral care QID Follow up Recommendations: Skilled Nursing facility SLP Visit Diagnosis: Dysphagia, unspecified (R13.10);Cognitive communication deficit (R41.841) Plan: Continue with current plan of care       GO                Mahala Menghini., M.A. CCC-SLP Acute Rehabilitation Services Pager [phone] Office [phone]  02/29/2020, 11:15 AM

## 2020-02-29 NOTE — Progress Notes (Signed)
Physical Therapy Treatment Patient Details Name: Tommy Garcia MRN: [id number] DOB: 1980-08-25 Today's Date: 02/29/2020    History of Present Illness Tommy Garcia is a 39 y.o. male who was transferred from [facility], admitted 7/27 with BG ICH, hypertensive emergency and DT's.    PT Comments    Patient progressing slowly towards PT goals. More alert during today's session. Pt with mumbled speech that is difficult to comprehend at times. Requires assist of 2 for bed mobility and standing; not able to get fully upright with flexed knees//hips holding onto back of chair for support. Noted to have increased tremors with mobility and difficulty performing ADL tasks with RUE EOB due to worsened tremors. Requiring anywhere from Min A-total A for balance EOB with posterior lean. HR up to 147 bpm with activity. Continues to have left inattention and visual deficits. Discharge recommendation updated to SNF as pt denied CIR. Will follow acutely.     Follow Up Recommendations  SNF (denied CIR)     Equipment Recommendations  Other (comment) (TBA)    Recommendations for Other Services       Precautions / Restrictions Precautions Precautions: Fall Precaution Comments: wrist restraints, mitts, posey belt, NG tube, watch HR Restrictions Weight Bearing Restrictions: No    Mobility  Bed Mobility Overal bed mobility: Needs Assistance Bed Mobility: Supine to Sit;Sit to Supine;Rolling Rolling: Mod assist   Supine to sit: Total assist;+2 for physical assistance;HOB elevated Sit to supine: Total assist;+2 for physical assistance;HOB elevated   General bed mobility comments: Total A for bed mobility, no initiation or assist with movement. Tremulous once EOB.  Transfers Overall transfer level: Needs assistance Equipment used: 2 person hand held assist (back of recliner) Transfers: Sit to/from Stand Sit to Stand: +2 physical assistance;Mod assist         General transfer comment: Assist of 2 to stand  from EOB, manually placing RUE on recliner. Not able to get fully upright, bil knees flexed and increased tremors throughout entire body. "I need to sit now" needing manual assist to lower onto bed. HR up to 147 bpm.  Ambulation/Gait                 Stairs             Wheelchair Mobility    Modified Rankin (Stroke Patients Only) Modified Rankin (Stroke Patients Only) Pre-Morbid Rankin Score: No symptoms Modified Rankin: Severe disability     Balance Overall balance assessment: Needs assistance Sitting-balance support: Feet supported;No upper extremity supported Sitting balance-Leahy Scale: Poor Sitting balance - Comments: Varying assist needed anywhere from Min A-total A with posterior lean. Tremors present. Difficulty washing face and brushing hair with RUE due to poor coordindation and tremors.   Standing balance support: During functional activity Standing balance-Leahy Scale: Poor Standing balance comment: Assist of 2 to stand from EOB but not able to get fully upright, tremors worsened in standing. Flexed at knees/hips. Holding onto back of chair for support. Limited standing tolerance.                            Cognition Arousal/Alertness: Lethargic Behavior During Therapy: Impulsive;Restless Overall Cognitive Status: Impaired/Different from baseline Area of Impairment: Orientation;Attention;Following commands;Safety/judgement;Awareness;Problem solving                 Orientation Level: Disoriented to;Place;Time;Situation Current Attention Level: Focused   Following Commands: Follows one step commands inconsistently Safety/Judgement: Decreased awareness of safety;Decreased awareness of deficits  Awareness: Intellectual Problem Solving: Slow processing;Difficulty sequencing;Requires verbal cues;Requires tactile cues General Comments: Thinks he is at home; States his brother's bday when asked his bday. "July" when cued to find calendar in  front of pt. Mumbled speech that is dificult to comprehend at times. Left inattention noted. Favors right gaze but able to gaze left with max cues but not sustain. Noted to have some visual deficits as well on right side? Able to read clock correctly. Cues to attend to task. Poor awareness of deficits. "I am a tall glass of beer."      Exercises      General Comments General comments (skin integrity, edema, etc.): HR up to 147 bpm with activity.      Pertinent Vitals/Pain Pain Assessment: Faces Faces Pain Scale: Hurts little more Pain Location: "all my joints" Pain Descriptors / Indicators: Aching Pain Intervention(s): Monitored during session;Repositioned    Home Living                      Prior Function            PT Goals (current goals can now be found in the care plan section) Progress towards PT goals: Progressing toward goals (slowly)    Frequency    Min 4X/week      PT Plan Discharge plan needs to be updated    Co-evaluation PT/OT/SLP Co-Evaluation/Treatment: Yes Reason for Co-Treatment: Necessary to address cognition/behavior during functional activity;For patient/therapist safety;To address functional/ADL transfers PT goals addressed during session: Mobility/safety with mobility;Balance;Strengthening/ROM        AM-PAC PT "6 Clicks" Mobility   Outcome Measure  Help needed turning from your back to your side while in a flat bed without using bedrails?: A Lot Help needed moving from lying on your back to sitting on the side of a flat bed without using bedrails?: Total Help needed moving to and from a bed to a chair (including a wheelchair)?: Total Help needed standing up from a chair using your arms (e.g., wheelchair or bedside chair)?: Total Help needed to walk in hospital room?: Total Help needed climbing 3-5 steps with a railing? : Total 6 Click Score: 7    End of Session Equipment Utilized During Treatment: Gait belt Activity Tolerance:  Patient tolerated treatment well;Patient limited by fatigue Patient left: in bed;with call bell/phone within reach;with bed alarm set Nurse Communication: Mobility status PT Visit Diagnosis: Other abnormalities of gait and mobility (R26.89);Hemiplegia and hemiparesis Hemiplegia - Right/Left: Left Hemiplegia - dominant/non-dominant: Non-dominant Hemiplegia - caused by: Nontraumatic intracerebral hemorrhage     Time: 0850-0913 PT Time Calculation (min) (ACUTE ONLY): 23 min  Charges:  $Therapeutic Activity: 8-22 mins                     Vale Haven, PT, DPT Acute Rehabilitation Services Pager [phone] Office [phone]       Blake Divine A Lanier Ensign 02/29/2020, 9:44 AM

## 2020-02-29 NOTE — Progress Notes (Signed)
Received from 4N in the bed accompanied by the nurse.  Oriented to room.  No signs of distress.

## 2020-03-01 LAB — GLUCOSE, CAPILLARY
Glucose-Capillary: 107 mg/dL — ABNORMAL HIGH (ref 70–99)
Glucose-Capillary: 111 mg/dL — ABNORMAL HIGH (ref 70–99)
Glucose-Capillary: 111 mg/dL — ABNORMAL HIGH (ref 70–99)
Glucose-Capillary: 115 mg/dL — ABNORMAL HIGH (ref 70–99)
Glucose-Capillary: 115 mg/dL — ABNORMAL HIGH (ref 70–99)
Glucose-Capillary: 115 mg/dL — ABNORMAL HIGH (ref 70–99)
Glucose-Capillary: 129 mg/dL — ABNORMAL HIGH (ref 70–99)
Glucose-Capillary: 137 mg/dL — ABNORMAL HIGH (ref 70–99)

## 2020-03-01 MED ORDER — LORAZEPAM 2 MG/ML IJ SOLN
1.0000 mg | INTRAMUSCULAR | Status: DC | PRN
  Administered 2020-03-01 – 2020-03-10 (×20): 1 mg via INTRAVENOUS
  Filled 2020-03-01 (×20): qty 1

## 2020-03-01 MED ORDER — LORAZEPAM 2 MG/ML IJ SOLN: 1.0000 mg | INTRAMUSCULAR | Status: DC | PRN

## 2020-03-01 NOTE — Progress Notes (Signed)
Physical Therapy Treatment Patient Details Name: Tommy Garcia MRN: [id number] DOB: 24-Oct-1980 Today's Date: 03/01/2020    History of Present Illness Tommy Garcia is a 39 y.o. male who was transferred from [facility], admitted 7/27 with BG ICH, hypertensive emergency and DT's.    PT Comments    Patient progressing slowly with Acute PT. Patient impulsive this session and reaching for NG tube and catheter intermittently, able to be redirected with tactile cues. Patient followed commands for mobility and Mod +2 assist required for safety and to maintain balance in standing. Pt continues to require repeated cues for use of Lt UE and assist to maintain grip on recliner back during sit<>stands. Pt's HR remained in 110-120's majority of session with short time in 130's that reduced with seated rest. He will continue to benefit from skilled PT interventions to address impairments, recommending SNF follow up.   Follow Up Recommendations  SNF     Equipment Recommendations  Other (comment) (TBA)    Recommendations for Other Services       Precautions / Restrictions Precautions Precautions: Fall Precaution Comments: wrist restraints, mitts, posey belt, NG tube, watch HR Restrictions Weight Bearing Restrictions: No    Mobility  Bed Mobility Overal bed mobility: Needs Assistance Bed Mobility: Supine to Sit;Sit to Supine     Supine to sit: Mod assist;+2 for safety/equipment;HOB elevated;+2 for physical assistance Sit to supine: Mod assist;+2 for safety/equipment;HOB elevated   General bed mobility comments: Pt impulsive with movement and repeated cues required for safety. Assist needed for Lt LE off EOB and to raise trunk and steady.  Transfers Overall transfer level: Needs assistance Equipment used: 2 person hand held assist (recliner back) Transfers: Sit to/from Stand Sit to Stand: +2 safety/equipment;+2 physical assistance;Mod assist         General transfer comment: Mod +2 assist to  steady wtih rising from EOB. Manual placement of bil UE's onto handle of recliner, pt able to maintain Rt hand grip. Pt requried assist to maintain Lt hand grip on recliner. Pt initiated power up impulsivly and requried cues for slow controlled lowering. Pt completed 2x5 reps for LE strengthening. HR reached 138 bpm max after first 5 reps, on second set HR remained in 120's.  Ambulation/Gait          Stairs      Wheelchair Mobility    Modified Rankin (Stroke Patients Only)       Balance Overall balance assessment: Needs assistance Sitting-balance support: Feet supported;No upper extremity supported Sitting balance-Leahy Scale: Poor Sitting balance - Comments: tremors noted in Lt>Rt UE and min-mod assist required to maintain balance at EOB.   Standing balance support: Bilateral upper extremity supported Standing balance-Leahy Scale: Poor Standing balance comment: +2 assist required for safety, heavily reliant on external support for standing balance.             Cognition Arousal/Alertness: Awake/alert Behavior During Therapy: Impulsive;Restless Overall Cognitive Status: Impaired/Different from baseline Area of Impairment: Orientation;Attention;Following commands;Safety/judgement;Awareness;Problem solving                 Orientation Level: Disoriented to;Time;Situation     Following Commands: Follows one step commands inconsistently Safety/Judgement: Decreased awareness of safety;Decreased awareness of deficits Awareness: Intellectual Problem Solving: Slow processing;Difficulty sequencing;Requires verbal cues;Requires tactile cues General Comments: Pt with ongoing Lt inattention requiring cues to utilize Lt hand on chair for standing.       Exercises      General Comments  Pertinent Vitals/Pain Pain Assessment: Faces Faces Pain Scale: Hurts a little bit Pain Location: nose Pain Descriptors / Indicators: Aching;Headache;Sore Pain  Intervention(s): Monitored during session           PT Goals (current goals can now be found in the care plan section) Acute Rehab PT Goals Patient Stated Goal: unable to state PT Goal Formulation: Patient unable to participate in goal setting Time For Goal Achievement: 03/11/20 Potential to Achieve Goals: Good Progress towards PT goals: Progressing toward goals    Frequency    Min 4X/week      PT Plan Current plan remains appropriate    Co-evaluation              AM-PAC PT "6 Clicks" Mobility   Outcome Measure  Help needed turning from your back to your side while in a flat bed without using bedrails?: A Lot Help needed moving from lying on your back to sitting on the side of a flat bed without using bedrails?: A Lot Help needed moving to and from a bed to a chair (including a wheelchair)?: Total Help needed standing up from a chair using your arms (e.g., wheelchair or bedside chair)?: Total Help needed to walk in hospital room?: Total Help needed climbing 3-5 steps with a railing? : Total 6 Click Score: 8    End of Session Equipment Utilized During Treatment: Gait belt Activity Tolerance: Patient tolerated treatment well Patient left: in bed;with bed alarm set;with call bell/phone within reach;with restraints reapplied Nurse Communication: Mobility status PT Visit Diagnosis: Other abnormalities of gait and mobility (R26.89);Hemiplegia and hemiparesis Hemiplegia - Right/Left: Left Hemiplegia - dominant/non-dominant: Non-dominant Hemiplegia - caused by: Nontraumatic intracerebral hemorrhage     Time: 1093-2355 PT Time Calculation (min) (ACUTE ONLY): 29 min  Charges:  $Therapeutic Exercise: 8-22 mins $Therapeutic Activity: 8-22 mins                     Wynn Maudlin, DPT Acute Rehabilitation Services  Office [phone] Pager [phone]  03/01/2020 2:37 PM

## 2020-03-01 NOTE — Progress Notes (Signed)
Nutrition Follow-up  DOCUMENTATION CODES:   Non-severe (moderate) malnutrition in context of chronic illness  INTERVENTION:  Continue via Cortrak: -Osmolite 1.5 cal @ 24m/hr (14436mper day) -4597mrosource TF TID  Provides 2280 kcal, 123 gm protein, 1097 ml free water daily  NUTRITION DIAGNOSIS:   Moderate Malnutrition related to chronic illness (ETOH abuse) as evidenced by moderate muscle depletion, moderate fat depletion.  Ongoing  GOAL:   Patient will meet greater than or equal to 90% of their needs  Met with TF  MONITOR:   TF tolerance, Diet advancement  REASON FOR ASSESSMENT:   Rounds    ASSESSMENT:   Pt with PMH of HTN, and ETOH abuse admitted with hypertensive R basal ganglia intraparenchymal hemorrhage and delirium tremens.  7/30 Cortrak placed (gastric)  Pt unavailable at time of RD visit. Pt noted to be slightly tremulous and confused. SLP following.    Pt pending SNF vs CIR admit.   Current TF via Cortrak: Osmolite 1.5 cal @ 31m42m, 45ml72msource TF TID  Labs: Na 128 (L) Medications: Folic acid, MVI, Protonix, Senokot-s, Thiamine  Diet Order:   Diet Order            Diet NPO time specified  Diet effective now                 EDUCATION NEEDS:   No education needs have been identified at this time  Skin:  Skin Assessment: Skin Integrity Issues: Skin Integrity Issues:: Other (Comment) Other: MASD perineum  Last BM:  7/31  Height:   Ht Readings from Last 1 Encounters:  02/24/20 _0  (1.803 m)    Weight:   Wt Readings from Last 1 Encounters:  03/01/20 75.6 kg    Ideal Body Weight:  78.1 kg  BMI:  Body mass index is 23.25 kg/m.  Estimated Nutritional Needs:   Kcal:  2200-2400  Protein:  110-120 grams  Fluid:  >2 L/day    AmandLarkin Ina RD, LDN RD pager number and weekend/on-call pager number located in [facility]

## 2020-03-01 NOTE — Hospital Course (Signed)
Tommy Garcia is a 39 y.o. male who was transferred from [facility], admitted 7/27 with BG ICH, hypertensive emergency and DT's. PCCM asked to assist with management of Dt's.  Transferred to medical service ?  As consult?  To follow for comorbidities, most concerningly alcohol withdrawals and DTs but patient is now 7 days out from alcohol and admission, unlikely to have any further or recurrent episodes at this point.

## 2020-03-01 NOTE — Progress Notes (Signed)
STROKE TEAM PROGRESS NOTE   INTERVAL HISTORY Patient is lying in bed.  He is   awake and follows commands.  He still has left hemiplegia can withdraw the left leg slightly.  Blood pressure adequately controlled.  He still has dysphagia and is getting feeds through core track tube.  Is still on Ativan for alcohol withdrawal precautions   Vitals:   02/29/20 2340 03/01/20 0341 03/01/20 0804 03/01/20 1135  BP: 130/74 127/77 113/75 112/72  Pulse: 93 99 81 82  Resp: '18 18 20 18  ' Temp: 98.2 F (36.8 C) 98.3 F (36.8 C) 98.5 F (36.9 C) 98.5 F (36.9 C)  TempSrc: Oral Oral Oral Oral  SpO2: 97% 98% 97% 98%  Weight:  75.6 kg    Height:       CBC:  Recent Labs  Lab 02/23/20 2009 02/24/20 0325 02/28/20 0414 02/29/20 0646  WBC 5.8   < > 3.8* 5.5  NEUTROABS 4.9  --   --   --   HGB 13.2   < > 12.0* 11.7*  HCT 38.5*   < > 35.3* 34.3*  MCV 96.0   < > 95.4 97.2  PLT 209   < > 239 281   < > = values in this interval not displayed.   Basic Metabolic Panel:  Recent Labs  Lab 02/26/20 0522 02/26/20 0522 02/27/20 8676 02/27/20 7209 02/28/20 0414 02/29/20 0646  NA 129*   < > 130*   < > 130* 128*  K 3.2*   < > 3.1*   < > 3.8 4.1  CL 93*   < > 94*   < > 100 96*  CO2 23   < > 23   < > 22 23  GLUCOSE 91   < > 102*   < > 114* 100*  BUN 7   < > 8   < > 8 7  CREATININE 0.53*   < > 0.52*   < > 0.44* 0.51*  CALCIUM 9.2   < > 9.2   < > 9.2 9.3  MG 1.6*  --  1.7  --   --   --   PHOS 3.0  --  3.2  --   --   --    < > = values in this interval not displayed.    IMAGING past 24 hours No results found.   PHYSICAL EXAM    General - Well nourished, well developed middle-aged male; he is slightly tremulous this morning.  Ophthalmologic - fundi not visualized due to noncooperation.  Cardiovascular - Regular rhythm and rate.  Neuro -awake and interactive..  Oriented to self, people, placenot oriented to time.  Paucity of speech, moderate dysarthria, follow simple midline and peripheral  commands.  Blinking to visual threat bilaterally, PERRL, EOMI. Left facial droop. Tongue midline. LUE 3/5 and LLE withdraw 4/5. RUE at least 4/5. Sensation subjectively symmetrical, coordination not corporative and gait not tested.   ASSESSMENT/PLAN Tommy Garcia is a 39 y.o. male with history of Etoh abuse and HTN presenting to [facility] with L sided weakness, R gaze preference in DTs.   Stroke: R basal ganglia ICH likely secondary to HTN   CT head R basal ganglia hemorrhage w/ R->L midline shift   CTA head & neck Unremarkable   Repeat CT head stable hemorrhage   CT repeat 7/30 stable hematoma, mildly increased midline shift and mass-effect  2D Echo EF 50-55%. No source of embolus   EEG 7/28 -  excessive beta, generalized slowing  LDL 71   HgbA1c 5.1  UDS positive for benzo and barbiturates  VTE prophylaxis - heparin subq   No antithrombotic prior to admission, now on No antithrombotic given hemorrhage   Therapy recommendations CIR  Disposition:  pending   Transfer to the floor, transition to [facility] as attending 8/3  Hypertensive emergency  Home meds:  None listed  Weaned off cleviprex  SBP goal < 160  On norvasc 10 and lisinopril 20 . Long-term BP goal normotensive  ETOH withdraw Delirium Tremens   Ativan for DTs  alcohol level <10  CIWA protocol - on ativan prn  Off precedex  Thiamine/folate/MVI  advised to drink no more than 2 drink(s) a day  As per mom, pt drinks 12 beers average per day PTA  seroquel 25 bid  Tachypnea, resolved  Likely related to alcohol withdraw and sedation  O2 sat WNL  CCM on board  Improved  Dysphagia  Severe Protein Malnutrition  Did not pass swallow  NPO now  Albumin 2.8  Cortrak 7/30  On IVF @ 50  TF @ 60  Tobacco abuse  Quit smoking 2 mos ago  Smoking cessation counseling will be provided to continue cessation   Other Stroke Risk Factors  Substance abuse  - UDS + benzo +  barbituate  Other Active Problems  Acute alcohol hepatitis, transaminitis AST/ALT 80/46->73/42->46/32, Alk Phos 234->211->147 (last checked 02/26/20)  Hyponatremia, Na 127-130-132-129->130->128  Hypokalemia, K 3.5 - 3.2 - supplement - 3.8->4.1  Hypomagnesemia, Mg 1.6 - supplement - 1.7    Hospital day # 7 Patient is likely outside the time window for alcohol withdrawal at this time.  Hence recommend tapering Ativan over the next few days and discontinuing.  Medical hospitalist team consult to help with alcohol withdrawal continue ongoing therapies and transfer to   SNF when bed available.  I spoke to the patient's mother over the phone and gave her an update and answered questions.  Discussed with Dr. Lynetta Mare critical care medicine.  Greater than 50% time during the 25-minute visit was spent on counseling and coordination of care and discussion with care team about his alcohol withdrawal, intracerebral hemorrhage and answering questions  Tommy Contras, MD  To contact Stroke Continuity provider, please refer to [email or web address] After hours, contact General Neurology

## 2020-03-02 ENCOUNTER — Inpatient Hospital Stay (HOSPITAL_COMMUNITY): Payer: Self-pay

## 2020-03-02 DIAGNOSIS — E440 Moderate protein-calorie malnutrition: Secondary | ICD-10-CM

## 2020-03-02 LAB — GLUCOSE, CAPILLARY
Glucose-Capillary: 105 mg/dL — ABNORMAL HIGH (ref 70–99)
Glucose-Capillary: 110 mg/dL — ABNORMAL HIGH (ref 70–99)
Glucose-Capillary: 122 mg/dL — ABNORMAL HIGH (ref 70–99)
Glucose-Capillary: 112 mg/dL — ABNORMAL HIGH (ref 70–99)
Glucose-Capillary: 110 mg/dL — ABNORMAL HIGH (ref 70–99)

## 2020-03-02 LAB — OSMOLALITY: Osmolality: 274 mOsm/kg — ABNORMAL LOW (ref 275–295)

## 2020-03-02 MED ORDER — POLYETHYLENE GLYCOL 3350 17 G PO PACK
17.0000 g | PACK | Freq: Every day | ORAL | Status: DC | PRN
  Administered 2020-03-02 – 2020-03-03 (×2): 17 g
  Filled 2020-03-02 (×2): qty 1

## 2020-03-02 NOTE — Progress Notes (Signed)
  Speech Language Pathology Treatment: Dysphagia  Patient Details Name: MASAYOSHI COUZENS MRN: [id number] DOB: [birth year] Today's Date: 03/02/2020 Time: 1740-8144 SLP Time Calculation (min) (ACUTE ONLY): 10 min  Assessment / Plan / Recommendation Clinical Impression  Pt is alert and cooperative this morning, but also easily distractible. He has immediate, explosive coughing with ice chips and water, not observed when given nectar thick liquids and purees. In discussion with RN, his mentation continues to fluctuate and he is still requiring ativan, so he is likely not ready for a consistent PO diet yet, but he would be appropriate for MBS in the near future to see if he can at least have more therapeutic boluses. Timing of testing will be dependent on mentation - discussed with RN and will f/u as able for completion.    HPI HPI: 39 y.o. male with history of Etoh abuse, HTN presenting to [facility] with L sided weakness, R gaze preference in DTs.  Transferred to [facility]; dx right basal ganglia ICH likely secondary to HTN.      SLP Plan  MBS;Continue with current plan of care       Recommendations  Diet recommendations: NPO Medication Administration: Via alternative means                Oral Care Recommendations: Oral care QID Follow up Recommendations: Skilled Nursing facility SLP Visit Diagnosis: Dysphagia, unspecified (R13.10) Plan: MBS;Continue with current plan of care       GO                Mahala Menghini., M.A. CCC-SLP Acute Rehabilitation Services Pager [phone] Office [phone]  03/02/2020, 10:25 AM

## 2020-03-02 NOTE — Plan of Care (Signed)
  Problem: Education: Goal: Knowledge of General Education information will improve Description: Including pain rating scale, medication(s)/side effects and non-pharmacologic comfort measures Outcome: Progressing   Problem: Health Behavior/Discharge Planning: Goal: Ability to manage health-related needs will improve Outcome: Progressing   Problem: Nutrition: Goal: Adequate nutrition will be maintained Outcome: Progressing  Extensive information regarding criteria for removal of NG tube and advancement of diet given to patient and family. Discharge criteria, including adequate patient oral intakes for admission to rehab discussed. MBS performed today, patient still with cortrack and Osmolite infusing.

## 2020-03-02 NOTE — Progress Notes (Signed)
Modified Barium Swallow Progress Note  Patient Details  Name: Tommy Garcia MRN: [id number] Date of Birth: 19-Jul-1981  Today's Date: 03/02/2020  Modified Barium Swallow completed.  Full report located under Chart Review in the Imaging Section.  Brief recommendations include the following:  Clinical Impression  Pt presents with a moderate pharyngeal more than oral dysphagia. He does have mildly reduced awareness during bolus acceptance and reduced bolus cohesion, but impairments are more significant in the pharyngeal phase. He has reduced anterior hyoid movement, laryngeal elevation, base of tongue retraction, and laryngeal vestibule closure. There is also a component of impaired timing, with his swallow triggering at the pyriform sinuses with liquids but more at the valleculae with solids. These deficits result in aspiration before the swallow with liquids that elicits a strong cough response only with larger volumes. Mild residue remains in the vallecular post-swallow across consistencies, with delayed coughing also noted after the testing stopped. Pt can achieve better timing and recruit more of his pharyngeal musculature when cued to "swallow hard," which may be an effective therapeutic tool, although with his current mentation I do not think he could use strategies consistently. Recommend that he remain NPO with use of temporary, alternative means of nutrition, but will f/u for pharyngeal strengthening exercises with use of purees in therapy. Tentative plan would also include repeat MBS as mentation improves and he can participate well in swallowing therapy.   Swallow Evaluation Recommendations       SLP Diet Recommendations: NPO;Alternative means - temporary       Medication Administration: Via alternative means       Oral Care Recommendations: Oral care QID        Mahala Menghini., M.A. CCC-SLP Acute Rehabilitation Services Pager [phone] Office [phone]  03/02/2020,2:18  PM

## 2020-03-02 NOTE — Progress Notes (Signed)
Physical Therapy Treatment Patient Details Name: Tommy Garcia Today's Date: 03/02/2020    History of Present Illness Pt is a 39 y.o. male who was transferred from [facility], admitted 7/27 with BG ICH, hypertensive emergency and DT's.    PT Comments    Pt is progressing towards goals. Pt required min guard up to mod assist for safety with bed mobility. Pt required +2 min to mod assist for sit to stand transfer with stedy, depending on surface height. Pt impulsive throughout session, requiring redirection to task and multimodal cuing. HR maintained from low 110s to low 120s during mobility. Current discharge plans remain appropriate. Pt would continue to benefit from skilled therapy in order to return to his PLOF. Will continue to follow acutely.    Follow Up Recommendations  SNF     Equipment Recommendations  None recommended by PT    Recommendations for Other Services       Precautions / Restrictions Precautions Precautions: Fall Precaution Comments: wrist restraints, mitts, posey belt, NG tube, watch HR Restrictions Weight Bearing Restrictions: No    Mobility  Bed Mobility Overal bed mobility: Needs Assistance Bed Mobility: Supine to Sit;Sit to Supine     Supine to sit: Min guard;+2 for safety/equipment;HOB elevated Sit to supine: Min guard;+2 for safety/equipment;HOB elevated   General bed mobility comments: Pt impulsive with mobility tasks and repeated cues required for safety. Pt tended to lean posteriorly at EOB and required mod assist for upright posture  Transfers Overall transfer level: Needs assistance   Transfers: Sit to/from Stand Sit to Stand: Mod assist;+2 physical assistance;+2 safety/equipment;Min assist         General transfer comment: pt required +2 mod assist from EOB with sit <>stand. Pt required +2 min assist from elevated surface of stedy. Pt required multimodal cueing and min assist for hand placement on stedy. Pt HR  was maintained around 110's with sit<>stand transfer. Pt was easily distracted in standing and required cues for redirection  Ambulation/Gait                 Stairs             Wheelchair Mobility    Modified Rankin (Stroke Patients Only) Modified Rankin (Stroke Patients Only) Pre-Morbid Rankin Score: No symptoms Modified Rankin: Severe disability     Balance Overall balance assessment: Needs assistance Sitting-balance support: Bilateral upper extremity supported;Feet supported Sitting balance-Leahy Scale: Poor Sitting balance - Comments: pt required mod assist for sitting balance at EOB. posterior lean noted  Postural control: Posterior lean Standing balance support: Bilateral upper extremity supported Standing balance-Leahy Scale: Poor Standing balance comment: pt required +2 mod assist for safety in stedy with BUE support. Pt heavily reliant on stedy railing and +2 therapist support to maintain standing balance         Cognition Arousal/Alertness: Awake/alert Behavior During Therapy: Impulsive;Restless Overall Cognitive Status: Impaired/Different from baseline Area of Impairment: Following commands;Safety/judgement;Awareness;Problem solving       Following Commands: Follows one step commands inconsistently;Follows one step commands with increased time Safety/Judgement: Decreased awareness of safety;Decreased awareness of deficits Awareness: Intellectual Problem Solving: Slow processing;Decreased initiation;Difficulty sequencing;Requires verbal cues;Requires tactile cues General Comments: Pt required multimodal cuing for hand placement on stedy. Pt had difficulty following simple one step commands and required increased time due to slow processing. Pt was impulsive with mobility tasks. Pt required redirection cues to prevent pulling out NG tube.       Exercises General Exercises - Lower  Extremity Hip Flexion/Marching: Limitations Hip Flexion/Marching  Limitations: pt unable to lift legs in standing from surface to march. Pt posture was noted to be very forwardly flexed and knees remained flexed.  (Simultaneous filing. User may not have seen previous data.) Mini-Sqauts: AROM;Strengthening;Both;15 reps;Standing (pt did 3 x 5 mini-squats in stedy with mod assist)    General Comments General comments (skin integrity, edema, etc.): Pt HR up to 123 in standing with partial squats. Gross LE MMT 3/5, uncoordinated in LLE, and resting tremors noted in L>R LE      Pertinent Vitals/Pain Pain Assessment: 0-10 Pain Score: 3  Pain Location: headache Pain Descriptors / Indicators: Throbbing Pain Intervention(s): Limited activity within patient's tolerance;Monitored during session    Home Living             Prior Function            PT Goals (current goals can now be found in the care plan section) Acute Rehab PT Goals Patient Stated Goal: unable to state PT Goal Formulation: With patient/family Time For Goal Achievement: 03/11/20 Potential to Achieve Goals: Good Progress towards PT goals: Progressing toward goals    Frequency    Min 4X/week      PT Plan Current plan remains appropriate    Co-evaluation         AM-PAC PT "6 Clicks" Mobility   Outcome Measure  Help needed turning from your back to your side while in a flat bed without using bedrails?: A Little Help needed moving from lying on your back to sitting on the side of a flat bed without using bedrails?: A Little Help needed moving to and from a bed to a chair (including a wheelchair)?: Total Help needed standing up from a chair using your arms (e.g., wheelchair or bedside chair)?: A Lot Help needed to walk in hospital room?: Total Help needed climbing 3-5 steps with a railing? : Total 6 Click Score: 11    End of Session Equipment Utilized During Treatment: Gait belt Activity Tolerance: Patient tolerated treatment well Patient left: in bed;with call Britani Beattie/phone  within reach;with bed alarm set;with restraints reapplied Nurse Communication: Mobility status PT Visit Diagnosis: Other abnormalities of gait and mobility (R26.89);Hemiplegia and hemiparesis;Muscle weakness (generalized) (M62.81) Hemiplegia - Right/Left: Left Hemiplegia - dominant/non-dominant: Non-dominant Hemiplegia - caused by: Nontraumatic intracerebral hemorrhage     Time: 9163-8466 PT Time Calculation (min) (ACUTE ONLY): 24 min  Charges:  $Therapeutic Exercise: 8-22 mins $Therapeutic Activity: 8-22 mins                     Harmon Pier, SPT  Acute Rehabilitation Services  Office: [phone]  03/02/2020, 5:36 PM

## 2020-03-02 NOTE — TOC CAGE-AID Note (Addendum)
Transition of Care [facility]) - CAGE-AID Screening   Patient Details  Name: Tommy Garcia MRN: [id number] Date of Birth: [birth year]  [facility] CM/SW Contact:    Jimmy Picket, LCSWA Phone Number: 03/02/2020, 11:57 AM   Clinical Narrative:  Pt unable to participate in assessment due to nly being oriented to self. Please reconsult with pt is better able to participate. Pt has a documented alcohol abuse and substance  history.   CAGE-AID Screening: Substance Abuse Screening unable to be completed due to: : Patient unable to participate                  Isabella Stalling Clinical Social Worker [phone]

## 2020-03-02 NOTE — Progress Notes (Signed)
STROKE TEAM PROGRESS NOTE   INTERVAL HISTORY Patient is sitting up in bed.  Is pleasant awake alert and cooperative.  He has dysarthria and left hemiparesis which is unchanged.  Blood pressure adequately controlled.  No new changes.  Await transfer to skilled nursing facility for rehab  Vitals:   03/01/20 2333 03/02/20 0330 03/02/20 0751 03/02/20 1203  BP: 122/73 135/73 119/81 114/67  Pulse: 91 78 91 79  Resp: _0 Temp: 97.9 F (36.6 C) 98.3 F (36.8 C) 99.4 F (37.4 C) 98.7 F (37.1 C)  TempSrc: Axillary Oral Oral Oral  SpO2: 99% 98% 98% 100%  Weight:  75.3 kg    Height:       CBC:  Recent Labs  Lab 02/28/20 0414 02/29/20 0646  WBC 3.8* 5.5  HGB 12.0* 11.7*  HCT 35.3* 34.3*  MCV 95.4 97.2  PLT 239 324   Basic Metabolic Panel:  Recent Labs  Lab 02/26/20 0522 02/26/20 0522 02/27/20 0643 02/27/20 0643 02/28/20 0414 02/29/20 0646  NA 129*   < > 130*   < > 130* 128*  K 3.2*   < > 3.1*   < > 3.8 4.1  CL 93*   < > 94*   < > 100 96*  CO2 23   < > 23   < > 22 23  GLUCOSE 91   < > 102*   < > 114* 100*  BUN 7   < > 8   < > 8 7  CREATININE 0.53*   < > 0.52*   < > 0.44* 0.51*  CALCIUM 9.2   < > 9.2   < > 9.2 9.3  MG 1.6*  --  1.7  --   --   --   PHOS 3.0  --  3.2  --   --   --    < > = values in this interval not displayed.    IMAGING past 24 hours DG Swallowing Func-Speech Pathology  Result Date: 03/02/2020 Objective Swallowing Evaluation: Type of Study: MBS-Modified Barium Swallow Study  Patient Details Name: JAEDYN LARD MRN: [id number] Date of Birth: 03-21-1981 Today's Date: 03/02/2020 Time: SLP Start Time (ACUTE ONLY): 6644 -SLP Stop Time (ACUTE ONLY): 1350 SLP Time Calculation (min) (ACUTE ONLY): 33 min Past Medical History: Past Medical History: Diagnosis Date . Alcohol abuse  . Hypertension  Past Surgical History: Past Surgical History: Procedure Laterality Date . HAND TENDON SURGERY Right  . OPEN REDUCTION INTERNAL FIXATION (ORIF) DISTAL RADIAL FRACTURE Left  12/26/2017  Procedure: OPEN REDUCTION INTERNAL FIXATION (ORIF) DISTAL RADIAL FRACTURE,;  Surgeon: Leanora Cover, MD;  Location: [place];  Service: Orthopedics;  Laterality: Left; . WRIST SURGERY Left around 1995 HPI: 39 y.o. male with history of Etoh abuse, HTN presenting [facility] with L sided weakness, R gaze preference in DTs.  Transferred to [facility]; dx right basal ganglia ICH likely secondary to HTN.  Subjective: alert, impulsive Assessment / Plan / Recommendation CHL IP CLINICAL IMPRESSIONS 03/02/2020 Clinical Impression Pt presents with a moderate pharyngeal more than oral dysphagia. He does have mildly reduced awareness during bolus acceptance and reduced bolus cohesion, but impairments are more significant in the pharyngeal phase. He has reduced anterior hyoid movement, laryngeal elevation, base of tongue retraction, and laryngeal vestibule closure. There is also a component of impaired timing, with his swallow triggering at the pyriform sinuses with liquids but more at the valleculae with solids. These deficits result in aspiration before  the swallow with liquids that elicits a strong cough response only with larger volumes. Mild residue remains in the vallecular post-swallow across consistencies, with delayed coughing also noted after the testing stopped. Pt can achieve better timing and recruit more of his pharyngeal musculature when cued to "swallow hard," which may be an effective therapeutic tool, although with his current mentation I do not think he could use strategies consistently. Recommend that he remain NPO with use of temporary, alternative means of nutrition, but will f/u for pharyngeal strengthening exercises with use of purees in therapy. Tentative plan would also include repeat MBS as mentation improves and he can participate well in swallowing therapy.  SLP Visit Diagnosis Dysphagia, oropharyngeal phase (R13.12) Attention and concentration deficit following --  Frontal lobe and executive function deficit following -- Impact on safety and function Moderate aspiration risk;Severe aspiration risk   CHL IP TREATMENT RECOMMENDATION 03/02/2020 Treatment Recommendations Therapy as outlined in treatment plan below   Prognosis 03/02/2020 Prognosis for Safe Diet Advancement Good Barriers to Reach Goals Cognitive deficits Barriers/Prognosis Comment -- CHL IP DIET RECOMMENDATION 03/02/2020 SLP Diet Recommendations NPO;Alternative means - temporary Liquid Administration via -- Medication Administration Via alternative means Compensations -- Postural Changes --   CHL IP OTHER RECOMMENDATIONS 03/02/2020 Recommended Consults -- Oral Care Recommendations Oral care QID Other Recommendations --   CHL IP FOLLOW UP RECOMMENDATIONS 03/02/2020 Follow up Recommendations Skilled Nursing facility   [facility] IP FREQUENCY AND DURATION 03/02/2020 Speech Therapy Frequency (ACUTE ONLY) min 3x week Treatment Duration 2 weeks      CHL IP ORAL PHASE 03/02/2020 Oral Phase Impaired Oral - Pudding Teaspoon -- Oral - Pudding Cup -- Oral - Honey Teaspoon Decreased bolus cohesion Oral - Honey Cup Decreased bolus cohesion;Other (Comment) Oral - Nectar Teaspoon Decreased bolus cohesion Oral - Nectar Cup -- Oral - Nectar Straw Decreased bolus cohesion Oral - Thin Teaspoon -- Oral - Thin Cup -- Oral - Thin Straw -- Oral - Puree Decreased bolus cohesion Oral - Mech Soft Decreased bolus cohesion;Impaired mastication Oral - Regular -- Oral - Multi-Consistency -- Oral - Pill -- Oral Phase - Comment --  CHL IP PHARYNGEAL PHASE 03/02/2020 Pharyngeal Phase Impaired Pharyngeal- Pudding Teaspoon -- Pharyngeal -- Pharyngeal- Pudding Cup -- Pharyngeal -- Pharyngeal- Honey Teaspoon Reduced epiglottic inversion;Reduced anterior laryngeal mobility;Reduced laryngeal elevation;Reduced airway/laryngeal closure;Reduced tongue base retraction;Delayed swallow initiation-pyriform sinuses;Penetration/Aspiration before swallow;Pharyngeal residue - valleculae  Pharyngeal Material enters airway, passes BELOW cords and not ejected out despite cough attempt by patient Pharyngeal- Honey Cup Reduced epiglottic inversion;Reduced anterior laryngeal mobility;Reduced laryngeal elevation;Reduced airway/laryngeal closure;Reduced tongue base retraction;Delayed swallow initiation-pyriform sinuses;Penetration/Aspiration before swallow Pharyngeal Material enters airway, passes BELOW cords and not ejected out despite cough attempt by patient Pharyngeal- Nectar Teaspoon Reduced epiglottic inversion;Reduced anterior laryngeal mobility;Reduced laryngeal elevation;Reduced airway/laryngeal closure;Reduced tongue base retraction;Delayed swallow initiation-pyriform sinuses;Penetration/Aspiration before swallow;Pharyngeal residue - valleculae Pharyngeal Material enters airway, passes BELOW cords without attempt by patient to eject out (silent aspiration) Pharyngeal- Nectar Cup -- Pharyngeal -- Pharyngeal- Nectar Straw Reduced epiglottic inversion;Reduced anterior laryngeal mobility;Reduced laryngeal elevation;Reduced airway/laryngeal closure;Reduced tongue base retraction;Delayed swallow initiation-pyriform sinuses;Penetration/Aspiration before swallow;Pharyngeal residue - valleculae Pharyngeal Material enters airway, passes BELOW cords without attempt by patient to eject out (silent aspiration) Pharyngeal- Thin Teaspoon -- Pharyngeal -- Pharyngeal- Thin Cup -- Pharyngeal -- Pharyngeal- Thin Straw -- Pharyngeal -- Pharyngeal- Puree Reduced epiglottic inversion;Reduced anterior laryngeal mobility;Reduced laryngeal elevation;Reduced airway/laryngeal closure;Reduced tongue base retraction;Delayed swallow initiation-vallecula;Pharyngeal residue - valleculae Pharyngeal -- Pharyngeal- Mechanical Soft Reduced epiglottic inversion;Reduced anterior laryngeal mobility;Reduced laryngeal elevation;Reduced  airway/laryngeal closure;Reduced tongue base retraction;Delayed swallow initiation-vallecula;Pharyngeal  residue - valleculae Pharyngeal -- Pharyngeal- Regular -- Pharyngeal -- Pharyngeal- Multi-consistency -- Pharyngeal -- Pharyngeal- Pill -- Pharyngeal -- Pharyngeal Comment --  CHL IP CERVICAL ESOPHAGEAL PHASE 03/02/2020 Cervical Esophageal Phase WFL Pudding Teaspoon -- Pudding Cup -- Honey Teaspoon -- Honey Cup -- Nectar Teaspoon -- Nectar Cup -- Nectar Straw -- Thin Teaspoon -- Thin Cup -- Thin Straw -- Puree -- Mechanical Soft -- Regular -- Multi-consistency -- Pill -- Cervical Esophageal Comment -- Osie Bond., M.A. [place] Acute Rehabilitation Services Pager [phone] Office [phone] 03/02/2020, 2:28 PM                PHYSICAL EXAM      General - Well nourished, well developed middle-aged male; he is slightly tremulous this morning.  Ophthalmologic - fundi not visualized due to noncooperation.  Cardiovascular - Regular rhythm and rate.  Neuro -awake and interactive..  Oriented to self, people, placenot oriented to time.  Paucity of speech, moderate dysarthria, follow simple midline and peripheral commands.  Blinking to visual threat bilaterally, PERRL, EOMI. Left facial droop. Tongue midline. LUE 3/5 and LLE withdraw 4/5. RUE at least 4/5. Sensation subjectively symmetrical, coordination not corporative and gait not tested.   ASSESSMENT/PLAN [name] is a 39 y.o. male with history of Etoh abuse and HTN presenting to [facility] with L sided weakness, R gaze preference in DTs.   Stroke: R basal ganglia ICH likely secondary to HTN   CT head R basal ganglia hemorrhage w/ R->L midline shift   CTA head & neck Unremarkable   Repeat CT head stable hemorrhage   CT repeat 7/30 stable hematoma, mildly increased midline shift and mass-effect  2D Echo EF 50-55%. No source of embolus   EEG 7/28 - excessive beta, generalized slowing  LDL 71   HgbA1c 5.1  UDS positive for benzo and barbiturates  VTE prophylaxis - heparin subq   No antithrombotic prior to admission, now  on No antithrombotic given hemorrhage   Therapy recommendations CIR->likely SNF given benefits  Disposition:  pending   Transition to [facility] as attending   Hypertensive emergency  Home meds:  None listed  Weaned off cleviprex  SBP goal < 160  On norvasc 10 and lisinopril 20 . Long-term BP goal normotensive  ETOH withdraw Delirium Tremens   Ativan for DTs  alcohol level <10  CIWA protocol - on ativan prn  Off precedex  Thiamine/folate/MVI  advised to drink no more than 2 drink(s) a day  As per mom, pt drinks 12 beers average per day PTA  seroquel 25 bid  Tapering ativa  stable  Tachypnea, resolved  Likely related to alcohol withdraw and sedation  O2 sat WNL  CCM on board  Improved  Dysphagia  Severe Protein Malnutrition  Did not pass swallow  NPO now  Albumin 2.8  Cortrak 7/30  On IVF @ 50  TF @ 60  Did not pass MBSS 8/4  Tobacco abuse  Quit smoking 2 mos ago  Smoking cessation counseling will be provided to continue cessation   Other Stroke Risk Factors  Substance abuse  - UDS + benzo + barbituate  Other Active Problems  Acute alcohol hepatitis, transaminitis AST/ALT 80/46->73/42->46/32, Alk Phos 234->211->147 (last checked 02/26/20)  Hyponatremia, Na 127-130-132-129->130->128  Hypokalemia, K 3.5 - 3.2 - supplement - 3.8->4.1  Hypomagnesemia, Mg 1.6 - supplement - 1.7    Hospital day # 8  Continue ongoing therapies and alcohol withdrawal taper  and stop Ativan.  Await medical hospitalist team consult and transfer of care.  Discussed with Dr. Darrick Meigs medical hospitalist.  Antony Contras, MD  To contact Stroke Continuity provider, please refer to [email or web address] After hours, contact General Neurology

## 2020-03-02 NOTE — Progress Notes (Signed)
[facility]  PROGRESS NOTE  Tommy Garcia CXK:[id number] DOB: 10-11-1980 DOA: 02/23/2020 PCP: Jacquelin Hawking, PA-C   Brief HPI:   39 year old male with history of alcohol abuse, hypertension, [facility] with [facility] of bilateral leg weakness and on examination was noted to have left hemiparesis.  CT head showed right basal ganglia bleed and patient was transferred to [facility].  Patient was found to be in extensive DTs, PCCM was consulted for DTs management.  Repeat CT head showed stability of bleed.  Neurology has signed off.    Subjective   Patient seen and examined, alert, following commands.   Assessment/Plan:     1. Hemorrhagic stroke-right basal ganglia intracerebral hemorrhage, secondary to hypertension.  CT head showed right basilar hemorrhage with right to left midline shift.  Repeat CT head on 02/26/2020 showed stable hematoma mildly increased midline shift and mass-effect.  SBP goal less than 160.  Neurology has signed off.  Patient to go to skilled nursing facility. 2. Hypertensive urgency-patient was found to be in hypertensive emergency, started on Cleviprex which has been weaned off.  Continue amlodipine 10 mg daily.  Blood pressures controlled.  Goal SBP less than 160.  Continue hydralazine 10 mg IV every 6 hours as needed for SBP greater than 160. 3. Alcohol withdrawal-continue CIWA protocol with Ativan.  Precedex has been weaned off.  Continue thiamine/folate.  Continue Seroquel 25 mg p.o. twice daily. 4. Severe protein calorie malnutrition/dysphagia-swallow evaluation obtained, patient did not pass swallow evaluation.  Continue cortrack feeding tube, placed on 02/26/2020.  Continue tube feeds with Osmolite 1.5 at 60 mill per hour. 5. Hyponatremia-likely SIADH vs cerebral salt wasting syndrome from ICH. Check urine sodium, serum osmolality. Follow BMP in a.m.   Scheduled medications:   .  stroke: mapping our early stages of recovery book    Does not apply Once  . amLODipine  10 mg Per Tube Daily  . chlorhexidine  15 mL Mouth Rinse BID  . Chlorhexidine Gluconate Cloth  6 each Topical Daily  . feeding supplement (PROSource TF)  45 mL Per Tube TID  . folic acid  1 mg Intravenous Daily  . heparin injection (subcutaneous)  5,000 Units Subcutaneous Q8H  . labetalol  20 mg Intravenous Once  . mouth rinse  15 mL Mouth Rinse q12n4p  . multivitamin with minerals  1 tablet Per Tube Daily  . pantoprazole sodium  40 mg Per Tube Daily  . QUEtiapine  25 mg Per Tube BID  . senna-docusate  1 tablet Per Tube BID  . thiamine  100 mg Per Tube Daily         CBG: Recent Labs  Lab 03/01/20 1929 03/01/20 2332 03/02/20 0328 03/02/20 0811 03/02/20 1204  GLUCAP 129* 107* 105* 110* 122*    SpO2: 100 % O2 Flow Rate (L/min): 2 L/min    CBC: Recent Labs  Lab 02/25/20 0243 02/26/20 0522 02/27/20 0643 02/28/20 0414 02/29/20 0646  WBC 7.6 5.2 5.0 3.8* 5.5  HGB 12.1* 11.3* 13.4 12.0* 11.7*  HCT 35.7* 34.0* 38.6* 35.3* 34.3*  MCV 97.5 96.6 94.8 95.4 97.2  PLT 194 196 198 239 281    Basic Metabolic Panel: Recent Labs  Lab 02/25/20 0243 02/26/20 0522 02/27/20 0643 02/28/20 0414 02/29/20 0646  NA 132* 129* 130* 130* 128*  K 3.5 3.2* 3.1* 3.8 4.1  CL 96* 93* 94* 100 96*  CO2 21* 23 23 22 23   GLUCOSE 81 91 102* 114* 100*  BUN 8 7  8 8 7   CREATININE 0.67 0.53* 0.52* 0.44* 0.51*  CALCIUM 9.3 9.2 9.2 9.2 9.3  MG  --  1.6* 1.7  --   --   PHOS  --  3.0 3.2  --   --      Liver Function Tests: Recent Labs  Lab 02/26/20 0522  AST 46*  ALT 32  ALKPHOS 147*  BILITOT 2.0*  PROT 7.5  ALBUMIN 2.8*     Antibiotics: Anti-infectives (From admission, onward)   None       DVT prophylaxis: SCDs  Code Status: Full code  Family Communication: No family at bedside    Status is: Inpatient  Dispo: The patient is from: Home              Anticipated d/c is to: Skilled nursing facility              Anticipated d/c date  is: 03/04/2020              Patient currently medically stable for discharge  Barrier to discharge-awaiting bed at skilled nursing facility       Consultants:  Neurology  PCCM  Procedures:  EEG   Objective   Vitals:   03/01/20 2333 03/02/20 0330 03/02/20 0751 03/02/20 1203  BP: 122/73 135/73 119/81 114/67  Pulse: 91 78 91 79  Resp: 16 18 20 20   Temp: 97.9 F (36.6 C) 98.3 F (36.8 C) 99.4 F (37.4 C) 98.7 F (37.1 C)  TempSrc: Axillary Oral Oral Oral  SpO2: 99% 98% 98% 100%  Weight:  75.3 kg    Height:        Intake/Output Summary (Last 24 hours) at 03/02/2020 1345 Last data filed at 03/02/2020 1200 Gross per 24 hour  Intake 3154.69 ml  Output 1950 ml  Net 1204.69 ml    08/02 1901 - 08/04 0700 In: 3856.7 [I.V.:1538.7] Out: 3900 [Urine:3900]  Filed Weights   02/29/20 0500 03/01/20 0341 03/02/20 0330  Weight: 75 kg 75.6 kg 75.3 kg    Physical Examination:    General: Appears in no acute distress  Cardiovascular: S1-S2, regular  Respiratory: Clear to auscultation bilaterally  Abdomen: Soft, nontender, no organomegaly  Extremities: No edema in the lower extremities  Neurologic: Alert, following commands, hemiparesis of left upper and lower extremity    Data Reviewed:   Recent Results (from the past 240 hour(s))  SARS Coronavirus 2 by RT PCR (hospital order, performed in [facility] hospital lab) Nasopharyngeal Nasopharyngeal Swab     Status: None   Collection Time: 02/23/20  7:31 PM   Specimen: Nasopharyngeal Swab  Result Value Ref Range Status   SARS Coronavirus 2 NEGATIVE NEGATIVE Final    Comment: (NOTE) SARS-CoV-2 target nucleic acids are NOT DETECTED.  The SARS-CoV-2 RNA is generally detectable in upper and lower respiratory specimens during the acute phase of infection. The lowest concentration of SARS-CoV-2 viral copies this assay can detect is 250 copies / mL. A negative result does not preclude SARS-CoV-2 infection and should not  be used as the sole basis for treatment or other patient management decisions.  A negative result may occur with improper specimen collection / handling, submission of specimen other than nasopharyngeal swab, presence of viral mutation(s) within the areas targeted by this assay, and inadequate number of viral copies (<250 copies / mL). A negative result must be combined with clinical observations, patient history, and epidemiological information.  Fact Sheet for Patients:   [facility]  Fact Sheet for Healthcare Providers:  https://pope.com/  This test is not yet approved or  cleared by the [place] and has been authorized for detection and/or diagnosis of SARS-CoV-2 by FDA under an Emergency Use Authorization (EUA).  This EUA will remain in effect (meaning this test can be used) for the duration of the COVID-19 declaration under Section 564(b)(1) of the Act, 21 U.S.C. section 360bbb-3(b)(1), unless the authorization is terminated or revoked sooner.  Performed at [facility], [address] [place], [place] [address]   MRSA PCR Screening     Status: None   Collection Time: 02/24/20  3:15 PM   Specimen: Nasal Mucosa; Nasopharyngeal  Result Value Ref Range Status   MRSA by PCR NEGATIVE NEGATIVE Final    Comment:        The GeneXpert MRSA Assay (FDA approved for NASAL specimens only), is one component of a comprehensive MRSA colonization surveillance program. It is not intended to diagnose MRSA infection nor to guide or monitor treatment for MRSA infections. Performed at [facility], [address], [place], [place] [address]          Meredeth Ide   [facility] If 7PM-7AM, please contact night-coverage at [email or web address], Office  [phone]   03/02/2020, 1:45 PM  LOS: 8 days

## 2020-03-03 LAB — GLUCOSE, CAPILLARY
Glucose-Capillary: 102 mg/dL — ABNORMAL HIGH (ref 70–99)
Glucose-Capillary: 113 mg/dL — ABNORMAL HIGH (ref 70–99)
Glucose-Capillary: 114 mg/dL — ABNORMAL HIGH (ref 70–99)
Glucose-Capillary: 128 mg/dL — ABNORMAL HIGH (ref 70–99)
Glucose-Capillary: 130 mg/dL — ABNORMAL HIGH (ref 70–99)

## 2020-03-03 LAB — BASIC METABOLIC PANEL
Anion gap: 10 (ref 5–15)
BUN: 13 mg/dL (ref 6–20)
CO2: 25 mmol/L (ref 22–32)
Calcium: 9.7 mg/dL (ref 8.9–10.3)
Chloride: 95 mmol/L — ABNORMAL LOW (ref 98–111)
Creatinine, Ser: 0.55 mg/dL — ABNORMAL LOW (ref 0.61–1.24)
GFR calc Af Amer: >60 mL/min (ref >60)
GFR calc non Af Amer: >60 mL/min (ref >60)
Glucose, Bld: 101 mg/dL — ABNORMAL HIGH (ref 70–99)
Potassium: 4.6 mmol/L (ref 3.5–5.1)
Sodium: 130 mmol/L — ABNORMAL LOW (ref 135–145)

## 2020-03-03 NOTE — Progress Notes (Signed)
  Speech Language Pathology Treatment: Dysphagia  Patient Details Name: Tommy Garcia MRN: [id number] DOB: Sep 03, 1980 Today's Date: 03/03/2020 Time: 4098-1191 SLP Time Calculation (min) (ACUTE ONLY): 12 min  Assessment / Plan / Recommendation Clinical Impression  Pt was seen for f/u dysphagia tx after MBS on previous date. Therapeutic trials of puree were administered without overt s/s of aspiration, although Mod cues were needed for awareness and attention to task. SLP introduced effortful swallows and modified CTAR with Mod cues for completion. Also attempted the Masako maneuver but cognitive he could not perform this. Will continue to follow for swallowing exercises.    HPI HPI: 39 y.o. male with history of Etoh abuse, HTN presenting to [facility] with L sided weakness, R gaze preference in DTs.  Transferred to [facility]; dx right basal ganglia ICH likely secondary to HTN.      SLP Plan  Continue with current plan of care       Recommendations  Diet recommendations: NPO Medication Administration: Via alternative means                Oral Care Recommendations: Oral care QID Follow up Recommendations: Skilled Nursing facility SLP Visit Diagnosis: Dysphagia, oropharyngeal phase (R13.12) Plan: Continue with current plan of care       GO                Mahala Menghini., M.A. CCC-SLP Acute Rehabilitation Services Pager [phone] Office [phone]  03/03/2020, 11:40 AM

## 2020-03-03 NOTE — Progress Notes (Signed)
Occupational Therapy Treatment Patient Details Name: Tommy Garcia MRN: [id number] DOB: 1981/06/06 Today's Date: 03/03/2020    History of present illness Pt is a 39 y.o. male who was transferred from [facility], admitted 7/27 with BG ICH, hypertensive emergency and DT's.   OT comments  Treatment session with focus on orientation and awareness during mobility.  Completed bed mobility and sit > stand with improved initiation, continuing to require mod assist +2 to steady once in standing position due to poor positional awareness (strong posterior lean) and significant increase in tremors and impulsivity.  Pt demonstrating increased impulsivity and decreased safety awareness in standing, requesting to walk to chair.  Unsafe to progress and ambulation or functional reaching in standing.  Pt able to complete lateral scoots along EOB with mod assist of 2 to reposition hips.  Pt's progress continues to be limited by continued cognitive deficits that impact safety awareness and decision making.  Pt will continue to benefit from skilled therapist to address these deficits as well as functional strength and stability as needed to complete ADLs and functional mobility.  Follow Up Recommendations  SNF    Equipment Recommendations  Other (comment) (defer to next venue of care)    Recommendations for Other Services Rehab consult    Precautions / Restrictions Precautions Precautions: Fall Precaution Comments: wrist restraints, mitts, posey belt, NG tube, watch HR Restrictions Weight Bearing Restrictions: No       Mobility Bed Mobility Overal bed mobility: Needs Assistance Bed Mobility: Supine to Sit;Sit to Supine Rolling: Mod assist   Supine to sit: +2 for safety/equipment;HOB elevated;Min assist Sit to supine: +2 for safety/equipment;HOB elevated;Mod assist   General bed mobility comments: pt required assist to BLE and trunk to rise to sitting, able to move BLE to EOB but poor coordination/maintenance  of positioning when asked to attend another movement/task. modA to maintain seated balance despite verbal and tactile cues  Transfers Overall transfer level: Needs assistance Equipment used: 2 person hand held assist Transfers: Sit to/from Stand Sit to Stand: Mod assist;+2 physical assistance;+2 safety/equipment         General transfer comment: +2 modA to rise from EOB x 4 through session with HHA. the pt requires cues and significant assist to steady once in standing position due to impulsivity and increased tremors with movement.    Balance Overall balance assessment: Needs assistance Sitting-balance support: Bilateral upper extremity supported;Feet supported Sitting balance-Leahy Scale: Poor Sitting balance - Comments: pt required mod assist for sitting balance at EOB. posterior lean noted  Postural control: Posterior lean Standing balance support: Bilateral upper extremity supported Standing balance-Leahy Scale: Poor Standing balance comment: pt required +2 mod assist for safety in static stance with BUE support.                           ADL either performed or assessed with clinical judgement   ADL Overall ADL's : Needs assistance/impaired Eating/Feeding: NPO                                     General ADL Comments: Bil UE tremors make it really hard for him to do any of his ADLs by himself     Vision   Vision Assessment?: Vision impaired- to be further tested in functional context Additional Comments: Pt able to scan Rt and Lt but with decreased sustained attention on any object.  Pt stating there are 6 individuals in his room (only 2 therapists) despite not looking at therapists during scanning.          Cognition Arousal/Alertness: Awake/alert Behavior During Therapy: Impulsive;Restless Overall Cognitive Status: Impaired/Different from baseline Area of Impairment: Following commands;Safety/judgement;Awareness;Problem  solving;Orientation                 Orientation Level: Disoriented to;Place;Time;Situation (pt stated multiple times he is at his house despite attempts to re-orient) Current Attention Level: Sustained   Following Commands: Follows one step commands inconsistently Safety/Judgement: Decreased awareness of safety;Decreased awareness of deficits Awareness: Intellectual Problem Solving: Slow processing;Decreased initiation;Difficulty sequencing;Requires verbal cues;Requires tactile cues General Comments: Pt requiring multimodal cueing for technique and sequencing, difficulty even with simple cues/commands as pt is very restless and distracted. Very impulsive with mobility with no insight to safety/deficits.              General Comments HR in 110's with mobility, uncoordinated LLE with increased tremors with mobility    Pertinent Vitals/ Pain       Pain Assessment: Faces Faces Pain Scale: No hurt Pain Intervention(s): Monitored during session;Repositioned         Frequency  Min 2X/week        Progress Toward Goals  OT Goals(current goals can now be found in the care plan section)  Progress towards OT goals: Progressing toward goals  Acute Rehab OT Goals Patient Stated Goal: unable to state Time For Goal Achievement: 03/11/20 Potential to Achieve Goals: Good  Plan Discharge plan remains appropriate    Co-evaluation    PT/OT/SLP Co-Evaluation/Treatment: Yes Reason for Co-Treatment: Necessary to address cognition/behavior during functional activity;For patient/therapist safety;To address functional/ADL transfers PT goals addressed during session: Mobility/safety with mobility;Balance OT goals addressed during session: ADL's and self-care;Strengthening/ROM      AM-PAC OT "6 Clicks" Daily Activity     Outcome Measure   Help from another person eating meals?: Total Help from another person taking care of personal grooming?: A Lot Help from another person  toileting, which includes using toliet, bedpan, or urinal?: Total Help from another person bathing (including washing, rinsing, drying)?: Total Help from another person to put on and taking off regular upper body clothing?: Total Help from another person to put on and taking off regular lower body clothing?: Total 6 Click Score: 7    End of Session Equipment Utilized During Treatment: Gait belt  OT Visit Diagnosis: Unsteadiness on feet (R26.81);Cognitive communication deficit (R41.841);Pain;Muscle weakness (generalized) (M62.81);Other symptoms and signs involving cognitive function Symptoms and signs involving cognitive functions: Other Nontraumatic ICH   Activity Tolerance Patient tolerated treatment well   Patient Left in bed;with call bell/phone within reach;with bed alarm set;with restraints reapplied   Nurse Communication Mobility status        Time: 1335-1404 OT Time Calculation (min): 29 min  Charges: OT General Charges $OT Visit: 1 Visit OT Treatments $Self Care/Home Management : 8-22 mins   Rosalio Loud [phone] 03/03/2020, 3:45 PM

## 2020-03-03 NOTE — Progress Notes (Signed)
[facility]  PROGRESS NOTE  Tommy Garcia QMG:[id number] DOB: 21-Aug-1980 DOA: 02/23/2020 PCP: Jacquelin Hawking, PA-C   Brief HPI:   39 year old male with history of alcohol abuse, hypertension, [facility] with complaints of bilateral leg weakness and on examination was noted to have left hemiparesis.  CT head showed right basal ganglia bleed and patient was transferred to [facility].  Patient was found to be in extensive DTs, PCCM was consulted for DTs management.  Repeat CT head showed stability of bleed.  Neurology has signed off.    Subjective   Patient seen and examined, confused.   Assessment/Plan:     1. Hemorrhagic stroke-right basal ganglia intracerebral hemorrhage, secondary to hypertension.  CT head showed right basilar hemorrhage with right to left midline shift.  Repeat CT head on 02/26/2020 showed stable hematoma mildly increased midline shift and mass-effect.  SBP goal less than 160.  Neurology has signed off.  Patient to go to skilled nursing facility. 2. Hypertensive urgency-patient was found to be in hypertensive emergency, started on Cleviprex which has been weaned off.  Continue amlodipine 10 mg daily.  Blood pressures controlled.  Goal SBP less than 160.  Continue hydralazine 10 mg IV every 6 hours as needed for SBP greater than 160. 3. Alcohol withdrawal-continue CIWA protocol with Ativan.  Precedex has been weaned off.  Continue thiamine/folate.  Continue Seroquel 25 mg p.o. twice daily. 4. Severe protein calorie malnutrition/dysphagia-swallow evaluation obtained, patient did not pass swallow evaluation.  Continue cortrack feeding tube, placed on 02/26/2020.  Continue tube feeds with Osmolite 1.5 at 60 mill per hour. 5. Hyponatremia-likely SIADH vs cerebral salt wasting syndrome from ICH.  Serum sodium has improved 130.  Serum osmolality is 274, urine sodium is pending.     Scheduled medications:   .  stroke: mapping our early stages of  recovery book   Does not apply Once  . amLODipine  10 mg Per Tube Daily  . chlorhexidine  15 mL Mouth Rinse BID  . Chlorhexidine Gluconate Cloth  6 each Topical Daily  . feeding supplement (PROSource TF)  45 mL Per Tube TID  . folic acid  1 mg Intravenous Daily  . heparin injection (subcutaneous)  5,000 Units Subcutaneous Q8H  . labetalol  20 mg Intravenous Once  . mouth rinse  15 mL Mouth Rinse q12n4p  . multivitamin with minerals  1 tablet Per Tube Daily  . pantoprazole sodium  40 mg Per Tube Daily  . QUEtiapine  25 mg Per Tube BID  . senna-docusate  1 tablet Per Tube BID  . thiamine  100 mg Per Tube Daily         CBG: Recent Labs  Lab 03/02/20 1204 03/02/20 1558 03/02/20 2026 03/03/20 0034 03/03/20 0343  GLUCAP 122* 112* 110* 130* 113*    SpO2: 100 % O2 Flow Rate (L/min): 2 L/min    CBC: Recent Labs  Lab 02/26/20 0522 02/27/20 0643 02/28/20 0414 02/29/20 0646  WBC 5.2 5.0 3.8* 5.5  HGB 11.3* 13.4 12.0* 11.7*  HCT 34.0* 38.6* 35.3* 34.3*  MCV 96.6 94.8 95.4 97.2  PLT 196 198 239 281    Basic Metabolic Panel: Recent Labs  Lab 02/26/20 0522 02/27/20 0643 02/28/20 0414 02/29/20 0646 03/03/20 0225  NA 129* 130* 130* 128* 130*  K 3.2* 3.1* 3.8 4.1 4.6  CL 93* 94* 100 96* 95*  CO2 23 23 22 23 25   GLUCOSE 91 102* 114* 100* 101*  BUN 7 8 8  7 13  CREATININE 0.53* 0.52* 0.44* 0.51* 0.55*  CALCIUM 9.2 9.2 9.2 9.3 9.7  MG 1.6* 1.7  --   --   --   PHOS 3.0 3.2  --   --   --      Liver Function Tests: Recent Labs  Lab 02/26/20 0522  AST 46*  ALT 32  ALKPHOS 147*  BILITOT 2.0*  PROT 7.5  ALBUMIN 2.8*     Antibiotics: Anti-infectives (From admission, onward)   None       DVT prophylaxis: SCDs  Code Status: Full code  Family Communication: No family at bedside    Status is: Inpatient  Dispo: The patient is from: Home              Anticipated d/c is to: Skilled nursing facility              Anticipated d/c date is: 03/04/2020               Patient currently medically stable for discharge  Barrier to discharge-awaiting bed at skilled nursing facility       Consultants:  Neurology  PCCM  Procedures:  EEG   Objective   Vitals:   03/03/20 0032 03/03/20 0340 03/03/20 0450 03/03/20 0843  BP: 133/80 (!) 128/91  117/76  Pulse: 88 85  75  Resp: 16 16  17   Temp: 98.4 F (36.9 C) 98.6 F (37 C)  98.3 F (36.8 C)  TempSrc: Oral Oral  Oral  SpO2: 99% 99%  100%  Weight:   74.8 kg   Height:        Intake/Output Summary (Last 24 hours) at 03/03/2020 1147 Last data filed at 03/03/2020 0700 Gross per 24 hour  Intake 3283.71 ml  Output 1450 ml  Net 1833.71 ml    08/03 1901 - 08/05 0700 In: 4737.4 [I.V.:1857.4] Out: 3400 [Urine:2450]  Filed Weights   03/01/20 0341 03/02/20 0330 03/03/20 0450  Weight: 75.6 kg 75.3 kg 74.8 kg    Physical Examination:    General-appears in no acute distress  Heart-S1-S2, regular, no murmur auscultated  Lungs-clear to auscultation bilaterally, no wheezing or crackles auscultated  Abdomen-soft, nontender, no organomegaly  Extremities-no edema in the lower extremities  Neuro-alert, oriented to self only, hemiparesis of left upper and lower extremity    Data Reviewed:   Recent Results (from the past 240 hour(s))  SARS Coronavirus 2 by RT PCR (hospital order, performed in [facility] hospital lab) Nasopharyngeal Nasopharyngeal Swab     Status: None   Collection Time: 02/23/20  7:31 PM   Specimen: Nasopharyngeal Swab  Result Value Ref Range Status   SARS Coronavirus 2 NEGATIVE NEGATIVE Final    Comment: (NOTE) SARS-CoV-2 target nucleic acids are NOT DETECTED.  The SARS-CoV-2 RNA is generally detectable in upper and lower respiratory specimens during the acute phase of infection. The lowest concentration of SARS-CoV-2 viral copies this assay can detect is 250 copies / mL. A negative result does not preclude SARS-CoV-2 infection and should not be used as the  sole basis for treatment or other patient management decisions.  A negative result may occur with improper specimen collection / handling, submission of specimen other than nasopharyngeal swab, presence of viral mutation(s) within the areas targeted by this assay, and inadequate number of viral copies (<250 copies / mL). A negative result must be combined with clinical observations, patient history, and epidemiological information.  Fact Sheet for Patients:   02/25/20  Fact Sheet for Healthcare  Providers: https://pope.com/  This test is not yet approved or  cleared by the [place] and has been authorized for detection and/or diagnosis of SARS-CoV-2 by FDA under an Emergency Use Authorization (EUA).  This EUA will remain in effect (meaning this test can be used) for the duration of the COVID-19 declaration under Section 564(b)(1) of the Act, 21 U.S.C. section 360bbb-3(b)(1), unless the authorization is terminated or revoked sooner.  Performed at [facility], [address], [place], [place] [address]   MRSA PCR Screening     Status: None   Collection Time: 02/24/20  3:15 PM   Specimen: Nasal Mucosa; Nasopharyngeal  Result Value Ref Range Status   MRSA by PCR NEGATIVE NEGATIVE Final    Comment:        The GeneXpert MRSA Assay (FDA approved for NASAL specimens only), is one component of a comprehensive MRSA colonization surveillance program. It is not intended to diagnose MRSA infection nor to guide or monitor treatment for MRSA infections. Performed at [facility], [address], [place], [place] [address]          Meredeth Ide   [facility] If 7PM-7AM, please contact night-coverage at [email or web address], Office  [phone]   03/03/2020, 11:47 AM  LOS: 9 days

## 2020-03-03 NOTE — Progress Notes (Signed)
Physical Therapy Treatment Patient Details Name: Tommy Garcia MRN: [id number] DOB: [birth year] Today's Date: 03/03/2020    History of Present Illness Pt is a 39 y.o. male who was transferred from [facility], admitted 7/27 with BG ICH, hypertensive emergency and DT's.    PT Comments    Pt in bed upon arrival of PT/OT, agreeable to session with focus on progression of OOB mobility. The pt was able to demo improvement in standing within the session, but continues to require modA of 2 to steady once in standing position due to poor positional awareness (strong posterior lean) and significant increase in tremors with movement. The pt was able to move laterally along the EOB using a stand/squat pivot technique with modA of 2 and HHA to reposition hips. The pt's progress is further limited by continued cognitive deficits that impact safety awareness and decision making, and will continue to benefit from skilled therapies to address these deficits as well as functional strength, stability, and mobility.     Follow Up Recommendations  SNF     Equipment Recommendations  None recommended by PT    Recommendations for Other Services       Precautions / Restrictions Precautions Precautions: Fall Precaution Comments: wrist restraints, mitts, posey belt, NG tube, watch HR Restrictions Weight Bearing Restrictions: No    Mobility  Bed Mobility Overal bed mobility: Needs Assistance Bed Mobility: Supine to Sit;Sit to Supine Rolling: Mod assist   Supine to sit: +2 for safety/equipment;HOB elevated;Min assist Sit to supine: +2 for safety/equipment;HOB elevated;Mod assist   General bed mobility comments: pt required assist to BLE and trunk to rise to sitting, able to move BLE to EOB but poor coordination/maintenance of positioning when asked to attend another movement/task. modA to maintain seated balance despite verbal and tactile cues  Transfers Overall transfer level: Needs assistance Equipment used:  2 person hand held assist Transfers: Sit to/from Stand Sit to Stand: Mod assist;+2 physical assistance;+2 safety/equipment         General transfer comment: +2 modA to rise from EOB x 4 through session with HHA. the pt requires cues and significant assist to steady once in standing position due to impulsivity and increased tremors with movement.  Ambulation/Gait Ambulation/Gait assistance:  (pt unable at this time)               Social research officer, government Rankin (Stroke Patients Only) Modified Rankin (Stroke Patients Only) Pre-Morbid Rankin Score: No symptoms Modified Rankin: Severe disability     Balance Overall balance assessment: Needs assistance Sitting-balance support: Bilateral upper extremity supported;Feet supported Sitting balance-Leahy Scale: Poor Sitting balance - Comments: pt required mod assist for sitting balance at EOB. posterior lean noted  Postural control: Posterior lean Standing balance support: Bilateral upper extremity supported Standing balance-Leahy Scale: Poor Standing balance comment: pt required +2 mod assist for safety in static stance with BUE support.                            Cognition Arousal/Alertness: Awake/alert Behavior During Therapy: Impulsive;Restless Overall Cognitive Status: Impaired/Different from baseline Area of Impairment: Following commands;Safety/judgement;Awareness;Problem solving;Orientation                 Orientation Level: Disoriented to;Place;Time;Situation (pt stated multiple times he is at his house despite attempts to re-orient) Current Attention Level: Sustained   Following Commands: Follows one step commands  inconsistently Safety/Judgement: Decreased awareness of safety;Decreased awareness of deficits Awareness: Intellectual Problem Solving: Slow processing;Decreased initiation;Difficulty sequencing;Requires verbal cues;Requires tactile cues General Comments:  Pt requiring multimodal cueing for technique and sequencing, difficulty even with simple cues/commands as pt is very restless and distracted. Very impulsive with mobility with no insight to safety/deficits.      Exercises      General Comments General comments (skin integrity, edema, etc.): HR in 110's with mobility, uncoordinated LLE with increased tremors with mobility      Pertinent Vitals/Pain Pain Assessment: Faces Faces Pain Scale: No hurt Pain Intervention(s): Monitored during session;Repositioned    Home Living                      Prior Function            PT Goals (current goals can now be found in the care plan section) Acute Rehab PT Goals Patient Stated Goal: unable to state PT Goal Formulation: With patient/family Time For Goal Achievement: 03/11/20 Potential to Achieve Goals: Good Progress towards PT goals: Progressing toward goals    Frequency    Min 4X/week      PT Plan Current plan remains appropriate    Co-evaluation PT/OT/SLP Co-Evaluation/Treatment: Yes Reason for Co-Treatment: Necessary to address cognition/behavior during functional activity;For patient/therapist safety PT goals addressed during session: Mobility/safety with mobility;Balance        AM-PAC PT "6 Clicks" Mobility   Outcome Measure  Help needed turning from your back to your side while in a flat bed without using bedrails?: A Little Help needed moving from lying on your back to sitting on the side of a flat bed without using bedrails?: A Little Help needed moving to and from a bed to a chair (including a wheelchair)?: Total Help needed standing up from a chair using your arms (e.g., wheelchair or bedside chair)?: A Lot Help needed to walk in hospital room?: Total Help needed climbing 3-5 steps with a railing? : Total 6 Click Score: 11    End of Session Equipment Utilized During Treatment: Gait belt Activity Tolerance: Patient tolerated treatment well Patient  left: in bed;with call bell/phone within reach;with bed alarm set;with restraints reapplied Nurse Communication: Mobility status PT Visit Diagnosis: Other abnormalities of gait and mobility (R26.89);Hemiplegia and hemiparesis;Muscle weakness (generalized) (M62.81) Hemiplegia - Right/Left: Left Hemiplegia - dominant/non-dominant: Non-dominant Hemiplegia - caused by: Nontraumatic intracerebral hemorrhage     Time: 1335-1404 PT Time Calculation (min) (ACUTE ONLY): 29 min  Charges:  $Gait Training: 8-22 mins                     Rolm Baptise, PT, DPT   Acute Rehabilitation Department Pager #: [phone]   Gaetana Michaelis 03/03/2020, 3:36 PM

## 2020-03-03 NOTE — Progress Notes (Signed)
STROKE TEAM PROGRESS NOTE   INTERVAL HISTORY Patient is sitting up in bed.  He is improving but still has mild dysarthria and left hemiparesis which is unchanged.  Blood pressure adequately controlled.  No new changes.  Await transfer to skilled nursing facility for rehab.  His mother arrived after rounds and talk to me about his prognosis and plan of care.  Vitals:   03/03/20 0032 03/03/20 0340 03/03/20 0450 03/03/20 0843  BP: 133/80 (!) 128/91  117/76  Pulse: 88 85  75  Resp: _0 Temp: 98.4 F (36.9 C) 98.6 F (37 C)  98.3 F (36.8 C)  TempSrc: Oral Oral  Oral  SpO2: 99% 99%  100%  Weight:   74.8 kg   Height:       CBC:  Recent Labs  Lab 02/28/20 0414 02/29/20 0646  WBC 3.8* 5.5  HGB 12.0* 11.7*  HCT 35.3* 34.3*  MCV 95.4 97.2  PLT 239 163   Basic Metabolic Panel:  Recent Labs  Lab 02/26/20 0522 02/26/20 0522 02/27/20 0643 02/28/20 0414 02/29/20 0646 03/03/20 0225  NA 129*   < > 130*   < > 128* 130*  K 3.2*   < > 3.1*   < > 4.1 4.6  CL 93*   < > 94*   < > 96* 95*  CO2 23   < > 23   < > 23 25  GLUCOSE 91   < > 102*   < > 100* 101*  BUN 7   < > 8   < > 7 13  CREATININE 0.53*   < > 0.52*   < > 0.51* 0.55*  CALCIUM 9.2   < > 9.2   < > 9.3 9.7  MG 1.6*  --  1.7  --   --   --   PHOS 3.0  --  3.2  --   --   --    < > = values in this interval not displayed.    IMAGING past 24 hours DG Swallowing Func-Speech Pathology  Result Date: 03/02/2020 Objective Swallowing Evaluation: Type of Study: MBS-Modified Barium Swallow Study  Patient Details Name: Tommy Garcia MRN: [id number] Date of Birth: 01/25/1981 Today's Date: 03/02/2020 Time: SLP Start Time (ACUTE ONLY): 1317 -SLP Stop Time (ACUTE ONLY): 1350 SLP Time Calculation (min) (ACUTE ONLY): 33 min Past Medical History: Past Medical History: Diagnosis Date  Alcohol abuse   Hypertension  Past Surgical History: Past Surgical History: Procedure Laterality Date  HAND TENDON SURGERY Right   OPEN REDUCTION INTERNAL  FIXATION (ORIF) DISTAL RADIAL FRACTURE Left 12/26/2017  Procedure: OPEN REDUCTION INTERNAL FIXATION (ORIF) DISTAL RADIAL FRACTURE,;  Surgeon: Leanora Cover, MD;  Location: [facility];  Service: Orthopedics;  Laterality: Left;  WRIST SURGERY Left around 1995 HPI: 39 y.o. male with history of Etoh abuse, HTN presenting to [facility] with L sided weakness, R gaze preference in DTs.  Transferred to [facility]; dx right basal ganglia ICH likely secondary to HTN.  Subjective: alert, impulsive Assessment / Plan / Recommendation CHL IP CLINICAL IMPRESSIONS 03/02/2020 Clinical Impression Pt presents with a moderate pharyngeal more than oral dysphagia. He does have mildly reduced awareness during bolus acceptance and reduced bolus cohesion, but impairments are more significant in the pharyngeal phase. He has reduced anterior hyoid movement, laryngeal elevation, base of tongue retraction, and laryngeal vestibule closure. There is also a component of impaired timing, with his swallow triggering at the pyriform sinuses with liquids  but more at the valleculae with solids. These deficits result in aspiration before the swallow with liquids that elicits a strong cough response only with larger volumes. Mild residue remains in the vallecular post-swallow across consistencies, with delayed coughing also noted after the testing stopped. Pt can achieve better timing and recruit more of his pharyngeal musculature when cued to "swallow hard," which may be an effective therapeutic tool, although with his current mentation I do not think he could use strategies consistently. Recommend that he remain NPO with use of temporary, alternative means of nutrition, but will f/u for pharyngeal strengthening exercises with use of purees in therapy. Tentative plan would also include repeat MBS as mentation improves and he can participate well in swallowing therapy.  SLP Visit Diagnosis Dysphagia, oropharyngeal phase (R13.12) Attention  and concentration deficit following -- Frontal lobe and executive function deficit following -- Impact on safety and function Moderate aspiration risk;Severe aspiration risk   CHL IP TREATMENT RECOMMENDATION 03/02/2020 Treatment Recommendations Therapy as outlined in treatment plan below   Prognosis 03/02/2020 Prognosis for Safe Diet Advancement Good Barriers to Reach Goals Cognitive deficits Barriers/Prognosis Comment -- CHL IP DIET RECOMMENDATION 03/02/2020 SLP Diet Recommendations NPO;Alternative means - temporary Liquid Administration via -- Medication Administration Via alternative means Compensations -- Postural Changes --   CHL IP OTHER RECOMMENDATIONS 03/02/2020 Recommended Consults -- Oral Care Recommendations Oral care QID Other Recommendations --   CHL IP FOLLOW UP RECOMMENDATIONS 03/02/2020 Follow up Recommendations Skilled Nursing facility   [facility] IP FREQUENCY AND DURATION 03/02/2020 Speech Therapy Frequency (ACUTE ONLY) min 3x week Treatment Duration 2 weeks      CHL IP ORAL PHASE 03/02/2020 Oral Phase Impaired Oral - Pudding Teaspoon -- Oral - Pudding Cup -- Oral - Honey Teaspoon Decreased bolus cohesion Oral - Honey Cup Decreased bolus cohesion;Other (Comment) Oral - Nectar Teaspoon Decreased bolus cohesion Oral - Nectar Cup -- Oral - Nectar Straw Decreased bolus cohesion Oral - Thin Teaspoon -- Oral - Thin Cup -- Oral - Thin Straw -- Oral - Puree Decreased bolus cohesion Oral - Mech Soft Decreased bolus cohesion;Impaired mastication Oral - Regular -- Oral - Multi-Consistency -- Oral - Pill -- Oral Phase - Comment --  CHL IP PHARYNGEAL PHASE 03/02/2020 Pharyngeal Phase Impaired Pharyngeal- Pudding Teaspoon -- Pharyngeal -- Pharyngeal- Pudding Cup -- Pharyngeal -- Pharyngeal- Honey Teaspoon Reduced epiglottic inversion;Reduced anterior laryngeal mobility;Reduced laryngeal elevation;Reduced airway/laryngeal closure;Reduced tongue base retraction;Delayed swallow initiation-pyriform sinuses;Penetration/Aspiration before  swallow;Pharyngeal residue - valleculae Pharyngeal Material enters airway, passes BELOW cords and not ejected out despite cough attempt by patient Pharyngeal- Honey Cup Reduced epiglottic inversion;Reduced anterior laryngeal mobility;Reduced laryngeal elevation;Reduced airway/laryngeal closure;Reduced tongue base retraction;Delayed swallow initiation-pyriform sinuses;Penetration/Aspiration before swallow Pharyngeal Material enters airway, passes BELOW cords and not ejected out despite cough attempt by patient Pharyngeal- Nectar Teaspoon Reduced epiglottic inversion;Reduced anterior laryngeal mobility;Reduced laryngeal elevation;Reduced airway/laryngeal closure;Reduced tongue base retraction;Delayed swallow initiation-pyriform sinuses;Penetration/Aspiration before swallow;Pharyngeal residue - valleculae Pharyngeal Material enters airway, passes BELOW cords without attempt by patient to eject out (silent aspiration) Pharyngeal- Nectar Cup -- Pharyngeal -- Pharyngeal- Nectar Straw Reduced epiglottic inversion;Reduced anterior laryngeal mobility;Reduced laryngeal elevation;Reduced airway/laryngeal closure;Reduced tongue base retraction;Delayed swallow initiation-pyriform sinuses;Penetration/Aspiration before swallow;Pharyngeal residue - valleculae Pharyngeal Material enters airway, passes BELOW cords without attempt by patient to eject out (silent aspiration) Pharyngeal- Thin Teaspoon -- Pharyngeal -- Pharyngeal- Thin Cup -- Pharyngeal -- Pharyngeal- Thin Straw -- Pharyngeal -- Pharyngeal- Puree Reduced epiglottic inversion;Reduced anterior laryngeal mobility;Reduced laryngeal elevation;Reduced airway/laryngeal closure;Reduced tongue base retraction;Delayed swallow initiation-vallecula;Pharyngeal residue - valleculae  Pharyngeal -- Pharyngeal- Mechanical Soft Reduced epiglottic inversion;Reduced anterior laryngeal mobility;Reduced laryngeal elevation;Reduced airway/laryngeal closure;Reduced tongue base retraction;Delayed  swallow initiation-vallecula;Pharyngeal residue - valleculae Pharyngeal -- Pharyngeal- Regular -- Pharyngeal -- Pharyngeal- Multi-consistency -- Pharyngeal -- Pharyngeal- Pill -- Pharyngeal -- Pharyngeal Comment --  CHL IP CERVICAL ESOPHAGEAL PHASE 03/02/2020 Cervical Esophageal Phase WFL Pudding Teaspoon -- Pudding Cup -- Honey Teaspoon -- Honey Cup -- Nectar Teaspoon -- Nectar Cup -- Nectar Straw -- Thin Teaspoon -- Thin Cup -- Thin Straw -- Puree -- Mechanical Soft -- Regular -- Multi-consistency -- Pill -- Cervical Esophageal Comment -- Osie Bond., M.A. [place] [facility] Rehabilitation Services Pager [phone] Office [phone] 03/02/2020, 2:28 PM                PHYSICAL EXAM      General - Well nourished, well developed middle-aged Caucasian male; he is slightly tremulous this morning.  Ophthalmologic - fundi not visualized due to noncooperation.  Cardiovascular - Regular rhythm and rate.  Neuro -awake and interactive..  Oriented to self, people, placenot oriented to time.  Paucity of speech, moderate dysarthria, follow simple midline and peripheral commands.  Blinking to visual threat bilaterally, PERRL, EOMI. Left facial droop. Tongue midline. LUE 4 /5 and LLE withdraw 4/5. RUE at least 4/5. Sensation subjectively symmetrical, coordination not corporative and gait not tested.   ASSESSMENT/PLAN [name] is a 39 y.o. male with history of Etoh abuse and HTN presenting to [facility] with L sided weakness, R gaze preference in DTs.   Stroke: R basal ganglia ICH likely secondary to HTN   CT head R basal ganglia hemorrhage w/ R->L midline shift   CTA head & neck Unremarkable   Repeat CT head stable hemorrhage   CT repeat 7/30 stable hematoma, mildly increased midline shift and mass-effect  2D Echo EF 50-55%. No source of embolus   EEG 7/28 - excessive beta, generalized slowing  LDL 71   HgbA1c 5.1  UDS positive for benzo and barbiturates  VTE prophylaxis - heparin  subq   No antithrombotic prior to admission, now on No antithrombotic given hemorrhage   Therapy recommendations CIR->likely SNF given benefits  Disposition:  pending   Transition to [facility] as attending   Hypertensive emergency  Home meds:  None listed  Weaned off cleviprex  SBP goal < 160  On norvasc 10 and lisinopril 20  Long-term BP goal normotensive  ETOH withdraw Delirium Tremens   Ativan for DTs  alcohol level <10  CIWA protocol - on ativan prn  Off precedex  Thiamine/folate/MVI  advised to drink no more than 2 drink(s) a day  As per mom, pt drinks 12 beers average per day PTA  seroquel 25 bid  Tapering ativa  stable  Tachypnea, resolved  Likely related to alcohol withdraw and sedation  O2 sat WNL  CCM on board  Improved  Dysphagia  Severe Protein Malnutrition  Did not pass swallow  NPO now  Albumin 2.8  Cortrak 7/30  On IVF @ 48  TF @ 42  Did not pass MBSS 8/4  Tobacco abuse  Quit smoking 2 mos ago  Smoking cessation counseling will be provided to continue cessation   Other Stroke Risk Factors  Substance abuse  - UDS + benzo + barbituate  Other Active Problems  Acute alcohol hepatitis, transaminitis AST/ALT 80/46->73/42->46/32, Alk Phos 234->211->147 (last checked 02/26/20)  Hyponatremia, Na 127-130-132-129->130->128  Hypokalemia, K 3.5 - 3.2 - supplement - 3.8->4.1  Hypomagnesemia, Mg 1.6 - supplement - 1.7  Hospital day # 9  Continue ongoing therapies .  Transfer to skilled nursing facility when bed available.  Discussed with patient and his mother and answered questions.  Discussed with Dr. Darrick Meigs medical hospitalist.  Stroke team will sign off.  Kindly call for questions  Antony Contras, MD  To contact Stroke Continuity provider, please refer to [email or web address] After hours, contact General Neurology

## 2020-03-04 LAB — GLUCOSE, CAPILLARY
Glucose-Capillary: 101 mg/dL — ABNORMAL HIGH (ref 70–99)
Glucose-Capillary: 104 mg/dL — ABNORMAL HIGH (ref 70–99)
Glucose-Capillary: 113 mg/dL — ABNORMAL HIGH (ref 70–99)
Glucose-Capillary: 114 mg/dL — ABNORMAL HIGH (ref 70–99)
Glucose-Capillary: 117 mg/dL — ABNORMAL HIGH (ref 70–99)
Glucose-Capillary: 120 mg/dL — ABNORMAL HIGH (ref 70–99)

## 2020-03-04 NOTE — Progress Notes (Signed)
[facility]  PROGRESS NOTE  Tommy Garcia JJK:[id number] DOB: 1981/03/03 DOA: 02/23/2020 PCP: Jacquelin Hawking, PA-C   Brief HPI:   39 year old male with history of alcohol abuse, hypertension, [facility] with [facility] of bilateral leg weakness and on examination was noted to have left hemiparesis.  CT head showed right basal ganglia bleed and patient was transferred to [facility].  Patient was found to be in extensive DTs, PCCM was consulted for DTs management.  Repeat CT head showed stability of bleed.  Neurology has signed off.    Subjective   Patient seen and examined, continues to be intermittently confused.  Mittens in place.   Assessment/Plan:     1. Hemorrhagic stroke-right basal ganglia intracerebral hemorrhage, secondary to hypertension.  CT head showed right basilar hemorrhage with right to left midline shift.  Repeat CT head on 02/26/2020 showed stable hematoma mildly increased midline shift and mass-effect.  SBP goal less than 160.  Neurology has signed off.  Patient to go to skilled nursing facility. 2. Hypertensive urgency-patient was found to be in hypertensive emergency, started on Cleviprex which has been weaned off.  Continue amlodipine 10 mg daily.  Blood pressures controlled.  Goal SBP less than 160.  Continue hydralazine 10 mg IV every 6 hours as needed for SBP greater than 160. 3. Alcohol withdrawal-continue CIWA protocol with Ativan.  Precedex has been weaned off.  Continue thiamine/folate.  Continue Seroquel 25 mg p.o. twice daily. 4. Severe protein calorie malnutrition/dysphagia-swallow evaluation obtained, patient did not pass swallow evaluation.  Continue cortrack feeding tube, placed on 02/26/2020.  Continue tube feeds with Osmolite 1.5 at 60 mill per hour. 5. Hyponatremia-likely SIADH vs cerebral salt wasting syndrome from ICH.  Serum sodium has improved 130.  Serum osmolality is 274, urine sodium is pending.     Scheduled  medications:   .  stroke: mapping our early stages of recovery book   Does not apply Once  . amLODipine  10 mg Per Tube Daily  . chlorhexidine  15 mL Mouth Rinse BID  . Chlorhexidine Gluconate Cloth  6 each Topical Daily  . feeding supplement (PROSource TF)  45 mL Per Tube TID  . folic acid  1 mg Intravenous Daily  . heparin injection (subcutaneous)  5,000 Units Subcutaneous Q8H  . labetalol  20 mg Intravenous Once  . mouth rinse  15 mL Mouth Rinse q12n4p  . multivitamin with minerals  1 tablet Per Tube Daily  . pantoprazole sodium  40 mg Per Tube Daily  . QUEtiapine  25 mg Per Tube BID  . senna-docusate  1 tablet Per Tube BID  . thiamine  100 mg Per Tube Daily         CBG: Recent Labs  Lab 03/03/20 2016 03/04/20 0020 03/04/20 0400 03/04/20 0747 03/04/20 1138  GLUCAP 114* 117* 113* 101* 104*    SpO2: 100 % O2 Flow Rate (L/min): 2 L/min    CBC: Recent Labs  Lab 02/27/20 0643 02/28/20 0414 02/29/20 0646  WBC 5.0 3.8* 5.5  HGB 13.4 12.0* 11.7*  HCT 38.6* 35.3* 34.3*  MCV 94.8 95.4 97.2  PLT 198 239 281    Basic Metabolic Panel: Recent Labs  Lab 02/27/20 0643 02/28/20 0414 02/29/20 0646 03/03/20 0225  NA 130* 130* 128* 130*  K 3.1* 3.8 4.1 4.6  CL 94* 100 96* 95*  CO2 23 22 23 25   GLUCOSE 102* 114* 100* 101*  BUN 8 8 7 13   CREATININE 0.52* 0.44* 0.51*  0.55*  CALCIUM 9.2 9.2 9.3 9.7  MG 1.7  --   --   --   PHOS 3.2  --   --   --      Liver Function Tests: No results for input(s): AST, ALT, ALKPHOS, BILITOT, PROT, ALBUMIN in the last 168 hours.   Antibiotics: Anti-infectives (From admission, onward)   None       DVT prophylaxis: SCDs  Code Status: Full code  Family Communication: No family at bedside    Status is: Inpatient  Dispo: The patient is from: Home              Anticipated d/c is to: Skilled nursing facility              Anticipated d/c date is: 03/07/2020              Patient currently medically stable for  discharge  Barrier to discharge-awaiting bed at skilled nursing facility       Consultants:  Neurology  PCCM  Procedures:  EEG   Objective   Vitals:   03/04/20 0500 03/04/20 0837 03/04/20 1200 03/04/20 1253  BP:  112/80 110/66 120/74  Pulse:  87 84 97  Resp:  16  18  Temp:  99.3 F (37.4 C)    TempSrc:  Oral    SpO2:  100%  100%  Weight: 75.2 kg     Height:        Intake/Output Summary (Last 24 hours) at 03/04/2020 1353 Last data filed at 03/04/2020 5093 Gross per 24 hour  Intake 2587.48 ml  Output 2200 ml  Net 387.48 ml    08/04 1901 - 08/06 0700 In: 4335.9 [I.V.:1973.9] Out: 3150 [Urine:900]  Filed Weights   03/02/20 0330 03/03/20 0450 03/04/20 0500  Weight: 75.3 kg 74.8 kg 75.2 kg    Physical Examination:    General-appears in no acute distress  Heart-S1-S2, regular, no murmur auscultated  Lungs-clear to auscultation bilaterally, no wheezing or crackles auscultated  Abdomen-soft, nontender, no organomegaly  Extremities-no edema in the lower extremities  Neuro-alert, oriented x3, left hemiparesis    Data Reviewed:   Recent Results (from the past 240 hour(s))  SARS Coronavirus 2 by RT PCR (hospital order, performed in [facility] hospital lab) Nasopharyngeal Nasopharyngeal Swab     Status: None   Collection Time: 02/23/20  7:31 PM   Specimen: Nasopharyngeal Swab  Result Value Ref Range Status   SARS Coronavirus 2 NEGATIVE NEGATIVE Final    Comment: (NOTE) SARS-CoV-2 target nucleic acids are NOT DETECTED.  The SARS-CoV-2 RNA is generally detectable in upper and lower respiratory specimens during the acute phase of infection. The lowest concentration of SARS-CoV-2 viral copies this assay can detect is 250 copies / mL. A negative result does not preclude SARS-CoV-2 infection and should not be used as the sole basis for treatment or other patient management decisions.  A negative result may occur with improper specimen collection /  handling, submission of specimen other than nasopharyngeal swab, presence of viral mutation(s) within the areas targeted by this assay, and inadequate number of viral copies (<250 copies / mL). A negative result must be combined with clinical observations, patient history, and epidemiological information.  Fact Sheet for Patients:   BoilerBrush.com.cy  Fact Sheet for Healthcare Providers: https://pope.com/  This test is not yet approved or  cleared by the [place] FDA and has been authorized for detection and/or diagnosis of SARS-CoV-2 by FDA under an Emergency Use Authorization (EUA).  This EUA will remain in effect (meaning this test can be used) for the duration of the COVID-19 declaration under Section 564(b)(1) of the Act, 21 U.S.C. section 360bbb-3(b)(1), unless the authorization is terminated or revoked sooner.  Performed at [facility], [address] [place], [place] [address]   MRSA PCR Screening     Status: None   Collection Time: 02/24/20  3:15 PM   Specimen: Nasal Mucosa; Nasopharyngeal  Result Value Ref Range Status   MRSA by PCR NEGATIVE NEGATIVE Final    Comment:        The GeneXpert MRSA Assay (FDA approved for NASAL specimens only), is one component of a comprehensive MRSA colonization surveillance program. It is not intended to diagnose MRSA infection nor to guide or monitor treatment for MRSA infections. Performed at [facility], [address], [place], [place] [address]          Meredeth Ide   [facility] If 7PM-7AM, please contact night-coverage at [email or web address], Office  [phone]   03/04/2020, 1:53 PM  LOS: 10 days

## 2020-03-05 LAB — GLUCOSE, CAPILLARY
Glucose-Capillary: 113 mg/dL — ABNORMAL HIGH (ref 70–99)
Glucose-Capillary: 98 mg/dL (ref 70–99)
Glucose-Capillary: 111 mg/dL — ABNORMAL HIGH (ref 70–99)
Glucose-Capillary: 105 mg/dL — ABNORMAL HIGH (ref 70–99)
Glucose-Capillary: 97 mg/dL (ref 70–99)
Glucose-Capillary: 96 mg/dL (ref 70–99)

## 2020-03-05 NOTE — Progress Notes (Addendum)
Upon rounding, cortrak tube noted out. Pt verbalized it was an accident" MD paged. [facility] team is not available on the weekend.  Dr. Sharl Ma returned page with order for SLP and possible NG tube.  RN paged SLP. Spoke with Speech Therfapist (Patsy), she will notify Dr. Sharl Ma of CLP POC.   Patient refused NG tube at this time.

## 2020-03-05 NOTE — Progress Notes (Signed)
[facility]  PROGRESS NOTE  Tommy GELPI EGB:[id number] DOB: 1980/08/25 DOA: 02/23/2020 PCP: Jacquelin Hawking, PA-C   Brief HPI:   39 year old male with history of alcohol abuse, hypertension, [facility] with [facility] of bilateral leg weakness and on examination was noted to have left hemiparesis.  CT head showed right basal ganglia bleed and patient was transferred to [facility].  Patient was found to be in extensive DTs, PCCM was consulted for DTs management.  Repeat CT head showed stability of bleed.  Neurology has signed off.    Subjective   Patient seen and examined, much more clear today.  Mother at bedside.  Answering questions appropriately.   Assessment/Plan:     1. Hemorrhagic stroke-right basal ganglia intracerebral hemorrhage, secondary to hypertension.  CT head showed right basilar hemorrhage with right to left midline shift.  Repeat CT head on 02/26/2020 showed stable hematoma mildly increased midline shift and mass-effect.  SBP goal less than 160.  Neurology has signed off.  Patient to go to skilled nursing facility. 2. Hypertensive emergency-patient was found to be in hypertensive emergency, started on Cleviprex which has been weaned off.  Continue amlodipine 10 mg daily.  Blood pressures controlled.  Goal SBP less than 160.  Continue hydralazine 10 mg IV every 6 hours as needed for SBP greater than 160. 3. Alcohol withdrawal-slowly improving, he only required 1 dose of Ativan last night.  Continue CIWA protocol with Ativan.  Precedex has been weaned off.  Continue thiamine/folate.  Continue Seroquel 25 mg p.o. twice daily. 4. Severe protein calorie malnutrition/dysphagia-swallow evaluation obtained, patient did not pass swallow evaluation.  Continue cortrack feeding tube, placed on 02/26/2020.  Continue tube feeds with Osmolite 1.5 at 60 mill per hour.  Will call speech therapist to reevaluate patient today for swallowing.  If able to swallow will  try to wean off cor track feeding tube and start on p.o. diet. 5. Hyponatremia-likely SIADH vs cerebral salt wasting syndrome from ICH.  Serum sodium has improved 130.  Serum osmolality is 274, urine sodium is pending.     Scheduled medications:   .  stroke: mapping our early stages of recovery book   Does not apply Once  . amLODipine  10 mg Per Tube Daily  . chlorhexidine  15 mL Mouth Rinse BID  . Chlorhexidine Gluconate Cloth  6 each Topical Daily  . feeding supplement (PROSource TF)  45 mL Per Tube TID  . folic acid  1 mg Intravenous Daily  . heparin injection (subcutaneous)  5,000 Units Subcutaneous Q8H  . labetalol  20 mg Intravenous Once  . mouth rinse  15 mL Mouth Rinse q12n4p  . multivitamin with minerals  1 tablet Per Tube Daily  . pantoprazole sodium  40 mg Per Tube Daily  . QUEtiapine  25 mg Per Tube BID  . senna-docusate  1 tablet Per Tube BID  . thiamine  100 mg Per Tube Daily         CBG: Recent Labs  Lab 03/04/20 1138 03/04/20 1933 03/04/20 2349 03/05/20 0355 03/05/20 0739  GLUCAP 104* 114* 120* 113* 98    SpO2: 98 % O2 Flow Rate (L/min): 2 L/min    CBC: Recent Labs  Lab 02/28/20 0414 02/29/20 0646  WBC 3.8* 5.5  HGB 12.0* 11.7*  HCT 35.3* 34.3*  MCV 95.4 97.2  PLT 239 281    Basic Metabolic Panel: Recent Labs  Lab 02/28/20 0414 02/29/20 0646 03/03/20 0225  NA 130* 128* 130*  K 3.8 4.1 4.6  CL 100 96* 95*  CO2 22 23 25   GLUCOSE 114* 100* 101*  BUN 8 7 13   CREATININE 0.44* 0.51* 0.55*  CALCIUM 9.2 9.3 9.7     Liver Function Tests: No results for input(s): AST, ALT, ALKPHOS, BILITOT, PROT, ALBUMIN in the last 168 hours.   Antibiotics: Anti-infectives (From admission, onward)   None       DVT prophylaxis: SCDs  Code Status: Full code  Family Communication: No family at bedside    Status is: Inpatient  Dispo: The patient is from: Home              Anticipated d/c is to: Skilled nursing facility               Anticipated d/c date is: 03/07/2020              Patient currently medically stable for discharge  Barrier to discharge-awaiting bed at skilled nursing facility       Consultants:  Neurology  PCCM  Procedures:  EEG   Objective   Vitals:   03/04/20 1929 03/04/20 2347 03/05/20 0338 03/05/20 0341  BP: 116/72 118/70 108/62   Pulse: 93 69 74   Resp: 18 16 17    Temp: 99.7 F (37.6 C) 98.1 F (36.7 C) 98.1 F (36.7 C)   TempSrc: Oral Oral Axillary   SpO2: 96% 98% 98%   Weight:    75.6 kg  Height:        Intake/Output Summary (Last 24 hours) at 03/05/2020 0846 Last data filed at 03/05/2020 0239 Gross per 24 hour  Intake --  Output 600 ml  Net -600 ml    08/05 1901 - 08/07 0700 In: 1563.7 [I.V.:721.7] Out: 2800 [Urine:1500]  Filed Weights   03/03/20 0450 03/04/20 0500 03/05/20 0341  Weight: 74.8 kg 75.2 kg 75.6 kg    Physical Examination:    General-appears in no acute distress  Heart-S1-S2, regular, no murmur auscultated  Lungs-clear to auscultation bilaterally, no wheezing or crackles auscultated  Abdomen-soft, nontender, no organomegaly  Extremities-no edema in the lower extremities  Neuro-alert, oriented x 3, mild weakness of right upper and lower extremity    Data Reviewed:   Recent Results (from the past 240 hour(s))  MRSA PCR Screening     Status: None   Collection Time: 02/24/20  3:15 PM   Specimen: Nasal Mucosa; Nasopharyngeal  Result Value Ref Range Status   MRSA by PCR NEGATIVE NEGATIVE Final    Comment:        The GeneXpert MRSA Assay (FDA approved for NASAL specimens only), is one component of a comprehensive MRSA colonization surveillance program. It is not intended to diagnose MRSA infection nor to guide or monitor treatment for MRSA infections. Performed at [facility], [address], [place], [facility] [address]        [place]   [facility] If 7PM-7AM, please contact night-coverage at  [email or web address], Office  [phone]   03/05/2020, 8:46 AM  LOS: 11 days

## 2020-03-06 ENCOUNTER — Inpatient Hospital Stay (HOSPITAL_COMMUNITY): Payer: Self-pay

## 2020-03-06 LAB — GLUCOSE, CAPILLARY
Glucose-Capillary: 90 mg/dL (ref 70–99)
Glucose-Capillary: 87 mg/dL (ref 70–99)
Glucose-Capillary: 109 mg/dL — ABNORMAL HIGH (ref 70–99)
Glucose-Capillary: 94 mg/dL (ref 70–99)
Glucose-Capillary: 106 mg/dL — ABNORMAL HIGH (ref 70–99)
Glucose-Capillary: 99 mg/dL (ref 70–99)

## 2020-03-06 MED ORDER — THIAMINE HCL 100 MG PO TABS
100.0000 mg | ORAL_TABLET | Freq: Every day | ORAL | Status: DC
  Administered 2020-03-07 – 2020-03-10 (×4): 100 mg via ORAL
  Filled 2020-03-06 (×4): qty 1

## 2020-03-06 MED ORDER — ACETAMINOPHEN 325 MG PO TABS
650.0000 mg | ORAL_TABLET | ORAL | Status: DC | PRN
  Administered 2020-03-10: 650 mg via ORAL
  Filled 2020-03-06: qty 2

## 2020-03-06 MED ORDER — ACETAMINOPHEN 650 MG RE SUPP: 650.0000 mg | RECTAL | Status: DC | PRN

## 2020-03-06 MED ORDER — ADULT MULTIVITAMIN W/MINERALS CH
1.0000 | ORAL_TABLET | Freq: Every day | ORAL | Status: DC
  Administered 2020-03-07 – 2020-03-10 (×4): 1 via ORAL
  Filled 2020-03-06 (×4): qty 1

## 2020-03-06 MED ORDER — QUETIAPINE FUMARATE 25 MG PO TABS
25.0000 mg | ORAL_TABLET | Freq: Two times a day (BID) | ORAL | Status: DC
  Administered 2020-03-06 – 2020-03-08 (×5): 25 mg via ORAL
  Filled 2020-03-06 (×4): qty 1

## 2020-03-06 MED ORDER — PANTOPRAZOLE SODIUM 40 MG PO PACK
40.0000 mg | PACK | Freq: Every day | ORAL | Status: DC
  Administered 2020-03-07 – 2020-03-10 (×4): 40 mg via ORAL
  Filled 2020-03-06 (×4): qty 20

## 2020-03-06 MED ORDER — AMLODIPINE BESYLATE 10 MG PO TABS
10.0000 mg | ORAL_TABLET | Freq: Every day | ORAL | Status: DC
  Administered 2020-03-08 – 2020-03-10 (×3): 10 mg via ORAL
  Filled 2020-03-06 (×4): qty 1

## 2020-03-06 MED ORDER — SENNOSIDES-DOCUSATE SODIUM 8.6-50 MG PO TABS
1.0000 | ORAL_TABLET | Freq: Two times a day (BID) | ORAL | Status: DC
  Administered 2020-03-06 – 2020-03-09 (×7): 1 via ORAL
  Filled 2020-03-06 (×7): qty 1

## 2020-03-06 MED ORDER — POLYETHYLENE GLYCOL 3350 17 G PO PACK: 17.0000 g | PACK | Freq: Every day | ORAL | Status: DC | PRN

## 2020-03-06 MED ORDER — ACETAMINOPHEN 160 MG/5ML PO SOLN: 650.0000 mg | ORAL | Status: DC | PRN

## 2020-03-06 NOTE — Progress Notes (Addendum)
[facility]  PROGRESS NOTE  Tommy Garcia HYI:[id number] DOB: 02/24/1981 DOA: 02/23/2020 PCP: Jacquelin Hawking, PA-C   Brief HPI:   39 year old male with history of alcohol abuse, hypertension, [facility] with [facility] of bilateral leg weakness and on examination was noted to have left hemiparesis.  CT head showed right basal ganglia bleed and patient was transferred to [facility].  Patient was found to be in extensive DTs, PCCM was consulted for DTs management.  Repeat CT head showed stability of bleed.  Neurology has signed off.    Subjective   Patient seen and examined, alert, oriented x3, tremulous.  Says did not sleep well last night.  He is currently n.p.o., pulled out to core track tube yesterday.  Swallow evaluation today.  Likely will need MBS.   Assessment/Plan:     1. Hemorrhagic stroke-right basal ganglia intracerebral hemorrhage, secondary to hypertension.  CT head showed right basilar hemorrhage with right to left midline shift.  Repeat CT head on 02/26/2020 showed stable hematoma mildly increased midline shift and mass-effect.  SBP goal less than 160.  Neurology has signed off.  Patient to go to skilled nursing facility. 2. Hypertensive emergency-patient was found to be in hypertensive emergency, started on Cleviprex which has been weaned off.  Continue amlodipine 10 mg daily.  Blood pressures controlled.  Goal SBP less than 160.  Continue hydralazine 10 mg IV every 6 hours as needed for SBP greater than 160. 3. Alcohol withdrawal-slowly improving, he only required 1 dose of Ativan last night.  Continue CIWA protocol with Ativan.  Precedex has been weaned off.  Continue thiamine/folate.  Continue Seroquel 25 mg p.o. twice daily. 4. Severe protein calorie malnutrition/dysphagia-swallow evaluation obtained, patient did not pass swallow evaluation.  Cortrack feeding tube was placed on 02/26/2020.  Patient pulled out feeding tube.  Refused NG tube  placement yesterday.  Cortrack team is not available on weekends.  Will obtain swallow evaluation again and likely need MBS. 5. Hyponatremia-likely SIADH vs cerebral salt wasting syndrome from ICH.  Serum sodium has improved 130.  Serum osmolality is 274, urine sodium is pending.     Scheduled medications:   .  stroke: mapping our early stages of recovery book   Does not apply Once  . amLODipine  10 mg Per Tube Daily  . chlorhexidine  15 mL Mouth Rinse BID  . Chlorhexidine Gluconate Cloth  6 each Topical Daily  . feeding supplement (PROSource TF)  45 mL Per Tube TID  . folic acid  1 mg Intravenous Daily  . heparin injection (subcutaneous)  5,000 Units Subcutaneous Q8H  . labetalol  20 mg Intravenous Once  . mouth rinse  15 mL Mouth Rinse q12n4p  . multivitamin with minerals  1 tablet Per Tube Daily  . pantoprazole sodium  40 mg Per Tube Daily  . QUEtiapine  25 mg Per Tube BID  . senna-docusate  1 tablet Per Tube BID  . thiamine  100 mg Per Tube Daily         CBG: Recent Labs  Lab 03/05/20 1517 03/05/20 1958 03/05/20 2326 03/06/20 0349 03/06/20 0805  GLUCAP 105* 97 96 90 87    SpO2: 99 % O2 Flow Rate (L/min): 2 L/min    CBC: Recent Labs  Lab 02/29/20 0646  WBC 5.5  HGB 11.7*  HCT 34.3*  MCV 97.2  PLT 281    Basic Metabolic Panel: Recent Labs  Lab 02/29/20 0646 03/03/20 0225  NA 128* 130*  K 4.1 4.6  CL 96* 95*  CO2 23 25  GLUCOSE 100* 101*  BUN 7 13  CREATININE 0.51* 0.55*  CALCIUM 9.3 9.7       Antibiotics: Anti-infectives (From admission, onward)   None       DVT prophylaxis: SCDs  Code Status: Full code  Family Communication: Discussed with mother at bedside    Status is: Inpatient  Dispo: The patient is from: Home              Anticipated d/c is to: Skilled nursing facility              Anticipated d/c date is: 03/07/2020              Patient currently medically stable for discharge  Barrier to discharge-awaiting bed at  skilled nursing facility       Consultants:  Neurology  PCCM  Procedures:  EEG   Objective   Vitals:   03/05/20 2326 03/06/20 0351 03/06/20 0352 03/06/20 0758  BP: 130/79  127/76 125/76  Pulse: 74  71 67  Resp: 18  18 18   Temp: 98.6 F (37 C)  98.9 F (37.2 C) 98.4 F (36.9 C)  TempSrc: Oral  Oral Oral  SpO2: 99%  100% 99%  Weight:  73.5 kg    Height:        Intake/Output Summary (Last 24 hours) at 03/06/2020 1055 Last data filed at 03/06/2020 1026 Gross per 24 hour  Intake 2328.82 ml  Output 2250 ml  Net 78.82 ml    08/06 1901 - 08/08 0700 In: 2328.8 [I.V.:1652.3] Out: 2050 [Urine:2050]  Filed Weights   03/04/20 0500 03/05/20 0341 03/06/20 0351  Weight: 75.2 kg 75.6 kg 73.5 kg    Physical Examination:   General-appears in no acute distress Heart-S1-S2, regular, no murmur auscultated Lungs-clear to auscultation bilaterally, no wheezing or crackles auscultated Abdomen-soft, nontender, no organomegaly Extremities-no edema in the lower extremities Neuro-alert, oriented x3, no focal deficit noted      Tommy Garcia S Tommy Garcia   [facility] If 7PM-7AM, please contact night-coverage at [email or web address], Office  [phone]   03/06/2020, 10:55 AM  LOS: 12 days

## 2020-03-06 NOTE — Progress Notes (Signed)
Modified Barium Swallow Progress Note  Patient Details  Name: Tommy Garcia MRN: [id number] Date of Birth: 12-Nov-1980  Today's Date: 03/06/2020  Modified Barium Swallow completed.  Full report located under Chart Review in the Imaging Section.  Brief recommendations include the following:  Clinical Impression  Pt presents with oropharyngeal dysphagia characterized by reduced labial seal, reduced bolus cohesion, reduced lingual retraction, a pharyngeal delay, and reduced anterior laryngeal movement. He demonstrated premature spillage to the level of the valleculae and pyriform sinuses, mild vallecular residue, and inconsistent silent aspiration (PAS 7, 8). Coughing was effective in expelling some of the aspirant. No functional benefit was noted with a left head turn posture. A chin tuck reduced the frequency of aspiration with thin and nectar thick liquids but did not eliminate it and pt exhibited difficulty consistently demonstrating this posture. Pharyngeal residue was reduced with pt's independent use of secondary swallows. Pt was able to swallow the 36mm barium tablet with saliva only, but it was retained in the upper thoracic esophagus until a honey thick liquid bolus was used. A dysphagia 3 diet with honey thick liquids is recommended at this time and SLP will continue to follow pt for dysphagia treatment.    Swallow Evaluation Recommendations       SLP Diet Recommendations: Dysphagia 3 (Mech soft) solids;Honey thick liquids   Liquid Administration via: Cup;Straw   Medication Administration: Whole meds with liquid   Supervision: Staff to assist with self feeding   Compensations: Slow rate;Small sips/bites       Oral Care Recommendations: Oral care BID       Tommy Garcia I. Vear Clock, MS, CCC-SLP Acute Rehabilitation Services Office number [phone] Pager [phone]  Tommy Garcia 03/06/2020,1:58 PM

## 2020-03-06 NOTE — Plan of Care (Signed)
  Problem: Coping: Goal: Level of anxiety will decrease Outcome: Progressing   Problem: Elimination: Goal: Will not experience complications related to bowel motility Outcome: Progressing   Problem: Pain Managment: Goal: General experience of comfort will improve Outcome: Progressing   

## 2020-03-06 NOTE — Progress Notes (Signed)
  Speech Language Pathology Treatment:    Patient Details Name: Tommy Garcia MRN: [id number] DOB: 06/08/1981 Today's Date: 03/06/2020 Time: 2703-5009 SLP Time Calculation (min) (ACUTE ONLY): 13 min  Assessment / Plan / Recommendation Clinical Impression  Pt was seen for dysphagia treatment with his mother present. Pt's mother and Dr. Sharl Ma indicated that the pt's mentation is improved and that Ativan has been held. Both parties are hopeful that he will be able to start a p.o. diet since he has removed his Cortrak. He tolerated puree, regular texture solids and 3/4 sets of consecutive swallows of thin liquids without overt s/sx of aspiration. His bolus awareness appears improved and cues were not needed. Pt does have a history of inconsistent laryngeal sensation and a modified barium swallow study is recommended to further assess swallow function.    HPI HPI: 39 y.o. male with history of Etoh abuse, HTN presenting to [facility] with L sided weakness, R gaze preference in DTs.  Transferred to [facility]; dx right basal ganglia ICH likely secondary to HTN.      SLP Plan  New goals to be determined pending instrumental study       Recommendations  Diet recommendations: NPO Medication Administration: Via alternative means                Oral Care Recommendations: Oral care QID Follow up Recommendations: Skilled Nursing facility SLP Visit Diagnosis: Dysphagia, oropharyngeal phase (R13.12) Plan: New goals to be determined pending instrumental study       Markisha Meding I. Vear Clock, MS, CCC-SLP Acute Rehabilitation Services Office number [phone] Pager [phone]                Scheryl Marten 03/06/2020, 10:04 AM

## 2020-03-07 LAB — GLUCOSE, CAPILLARY
Glucose-Capillary: 105 mg/dL — ABNORMAL HIGH (ref 70–99)
Glucose-Capillary: 89 mg/dL (ref 70–99)
Glucose-Capillary: 96 mg/dL (ref 70–99)
Glucose-Capillary: 99 mg/dL (ref 70–99)
Glucose-Capillary: 102 mg/dL — ABNORMAL HIGH (ref 70–99)
Glucose-Capillary: 119 mg/dL — ABNORMAL HIGH (ref 70–99)
Glucose-Capillary: 137 mg/dL — ABNORMAL HIGH (ref 70–99)
Glucose-Capillary: 122 mg/dL — ABNORMAL HIGH (ref 70–99)

## 2020-03-07 MED ORDER — FOLIC ACID 1 MG PO TABS
1.0000 mg | ORAL_TABLET | Freq: Every day | ORAL | Status: DC
  Administered 2020-03-07 – 2020-03-10 (×4): 1 mg via ORAL
  Filled 2020-03-07 (×4): qty 1

## 2020-03-07 NOTE — Progress Notes (Signed)
Nutrition Follow-up  DOCUMENTATION CODES:   Non-severe (moderate) malnutrition in context of chronic illness  INTERVENTION:  D/C 61ml Prosource TF D/C [place] 1.5 cal @ 37ml/hr  Magic cup TID with meals, each supplement provides 290 kcal and 9 grams of protein  NUTRITION DIAGNOSIS:   Moderate Malnutrition related to chronic illness (ETOH abuse) as evidenced by moderate muscle depletion, moderate fat depletion.  Ongoing  GOAL:   Patient will meet greater than or equal to 90% of their needs  Progressing  MONITOR:   TF tolerance, Diet advancement  REASON FOR ASSESSMENT:   Rounds    ASSESSMENT:   Pt with PMH of HTN, and ETOH abuse admitted with hypertensive R basal ganglia intraparenchymal hemorrhage and delirium tremens.  7/30 Cortrak placed (gastric) 8/7 pt pulled Cortrak out 8/8 s/p MBS, diet advanced to Dysphagia 3 with honey thick liquids  Pt sleeping at time of RD visit. Discussed pt with RN who reports pt pulled his Cortrak over the weekend and was then approved for po diet. Per RN, pt has been doing well with po intake ever since. Documented as 100% intake.   Labs: Na 130 (L) Medications: Folvite, MVI, Protonix, Senokot-S, Thiamine  Diet Order:   Diet Order            DIET DYS 3 Room service appropriate? Yes with Assist; Fluid consistency: Honey Thick  Diet effective now                 EDUCATION NEEDS:   No education needs have been identified at this time  Skin:  Skin Assessment: Skin Integrity Issues: Skin Integrity Issues:: Other (Comment) Other: MASD perineum  Last BM:  8/8 type 7  Height:   Ht Readings from Last 1 Encounters:  02/24/20 5\' 11"  (1.803 m)    Weight:   Wt Readings from Last 1 Encounters:  03/07/20 71.1 kg    Ideal Body Weight:  78.1 kg  BMI:  Body mass index is 21.86 kg/m.  Estimated Nutritional Needs:   Kcal:  2200-2400  Protein:  110-120 grams  Fluid:  >2 L/day    05/07/20, MS, RD, LDN RD  pager number and weekend/on-call pager number located in Amion.

## 2020-03-07 NOTE — Progress Notes (Signed)
[facility]  PROGRESS NOTE  Tommy Garcia DOB: 11/10/1980 DOA: 02/23/2020 PCP: Jacquelin Hawking, PA-C   Brief HPI:   39 year old male with history of alcohol abuse, hypertension, [facility] with complaints of bilateral leg weakness and on examination was noted to have left hemiparesis.  CT head showed right basal ganglia bleed and patient was transferred to [facility].  Patient was found to be in extensive DTs, PCCM was consulted for DTs management.  Repeat CT head showed stability of bleed.  Neurology has signed off.    Subjective   Patient seen and examined, seen by speech therapy yesterday.  Started on dysphagia 3 diet.  He is much more clear today.  Off mittens.  Wants to go home.   Assessment/Plan:     1. Hemorrhagic stroke-right basal ganglia intracerebral hemorrhage, secondary to hypertension.  CT head showed right basilar hemorrhage with right to left midline shift.  Repeat CT head on 02/26/2020 showed stable hematoma mildly increased midline shift and mass-effect.  SBP goal less than 160.  Neurology has signed off.  Patient to go to skilled nursing facility. 2. Hypertensive emergency-patient was found to be in hypertensive emergency, started on Cleviprex which has been weaned off.  Continue amlodipine 10 mg daily.  Blood pressures controlled.  Goal SBP less than 160.  Continue hydralazine 10 mg IV every 6 hours as needed for SBP greater than 160. 3. Alcohol withdrawal-significantly improved.  Continue CIWA protocol with Ativan.  Precedex has been weaned off.  Continue thiamine/folate.  Continue Seroquel 25 mg p.o. twice daily. 4. Severe protein calorie malnutrition/dysphagia-swallow evaluation obtained, patient did not pass swallow evaluation.  Cortrack feeding tube was placed on 02/26/2020.  Patient pulled out feeding tube.  Speech therapy was consulted, patient underwent swallow evaluation with MBS.  Started on dysphagia 3 diet.   5. Hyponatremia-likely SIADH vs cerebral salt wasting syndrome from ICH.  Serum sodium has improved 130.  Serum osmolality is 274   Scheduled medications:   .  stroke: mapping our early stages of recovery book   Does not apply Once  . amLODipine  10 mg Oral Daily  . chlorhexidine  15 mL Mouth Rinse BID  . Chlorhexidine Gluconate Cloth  6 each Topical Daily  . folic acid  1 mg Oral Daily  . heparin injection (subcutaneous)  5,000 Units Subcutaneous Q8H  . labetalol  20 mg Intravenous Once  . mouth rinse  15 mL Mouth Rinse q12n4p  . multivitamin with minerals  1 tablet Oral Daily  . pantoprazole sodium  40 mg Oral Daily  . QUEtiapine  25 mg Oral BID  . senna-docusate  1 tablet Oral BID  . thiamine  100 mg Oral Daily         CBG: Recent Labs  Lab 03/06/20 1629 03/06/20 1927 03/06/20 2313 03/07/20 0340 03/07/20 0818  GLUCAP 94 106* 99 96 99    SpO2: 100 % O2 Flow Rate (L/min): 2 L/min    CBC: No results for input(s): WBC, NEUTROABS, HGB, HCT, MCV, PLT in the last 168 hours.  Basic Metabolic Panel: Recent Labs  Lab 03/03/20 0225  NA 130*  K 4.6  CL 95*  CO2 25  GLUCOSE 101*  BUN 13  CREATININE 0.55*  CALCIUM 9.7       Antibiotics: Anti-infectives (From admission, onward)   None       DVT prophylaxis: Heparin, confirmed with Dr. Pearlean Brownie.    Code Status: Full code  Family Communication:  Discussed with mother at bedside    Status is: Inpatient  Dispo: The patient is from: Home              Anticipated d/c is to: Skilled nursing facility              Anticipated d/c date is: 03/10/2020              Patient currently medically stable for discharge  Barrier to discharge-awaiting bed at skilled nursing facility       Consultants:  Neurology  PCCM  Procedures:  EEG   Objective   Vitals:   03/06/20 2315 03/07/20 0153 03/07/20 0343 03/07/20 0811  BP: 119/74  (!) 138/96 98/79  Pulse: 60  75 68  Resp: 16  18 18   Temp: 98.2 F  (36.8 C)  98.8 F (37.1 C) 99.1 F (37.3 C)  TempSrc:   Oral Oral  SpO2: 99%  98% 100%  Weight:  71.1 kg    Height:        Intake/Output Summary (Last 24 hours) at 03/07/2020 1227 Last data filed at 03/07/2020 0300 Gross per 24 hour  Intake 120 ml  Output 800 ml  Net -680 ml    08/07 1901 - 08/09 0700 In: 120 [P.O.:120] Out: 1600 [Urine:1600]  Filed Weights   03/05/20 0341 03/06/20 0351 03/07/20 0153  Weight: 75.6 kg 73.5 kg 71.1 kg    Physical Examination:   General-appears in no acute distress Heart-S1-S2, regular, no murmur auscultated Lungs-clear to auscultation bilaterally, no wheezing or crackles auscultated Abdomen-soft, nontender, no organomegaly Extremities-no edema in the lower extremities Neuro-alert, oriented x3, no focal deficit noted      Tommy Garcia S Tommy Garcia   [facility] If 7PM-7AM, please contact night-coverage at [email or web address], Office  [phone]   03/07/2020, 12:27 PM  LOS: 13 days

## 2020-03-07 NOTE — Plan of Care (Signed)
  Problem: Clinical Measurements: Goal: Will remain free from infection Outcome: Progressing Goal: Diagnostic test results will improve Outcome: Progressing   Problem: Activity: Goal: Risk for activity intolerance will decrease Outcome: Progressing   Problem: Nutrition: Goal: Adequate nutrition will be maintained Outcome: Progressing   

## 2020-03-07 NOTE — Progress Notes (Signed)
  Speech Language Pathology Treatment: Dysphagia  Patient Details Name: Tommy Garcia MRN: [id number] DOB: 1981-02-22 Today's Date: 03/07/2020 Time: 6004-5997 SLP Time Calculation (min) (ACUTE ONLY): 13 min  Assessment / Plan / Recommendation Clinical Impression  F/u after yesterday's MBS and recs for dysphagia 3/honey-thick liquid.  Pt very enthusiastic about his lunch today.  He consumed 3 oz of honey-thick liquid and a container of Magic Cup from his snack bag with verbal and gentle physical cues needed to slow pace/inhibit impulsivity and monitor bolus size.  Pt receptive to cues.  Confusion persists.  Overall with excellent toleration.  Continue SLP for dysphagia/cognitive deficits.    HPI HPI: 39 y.o. male with history of Etoh abuse, HTN presenting to [facility] with L sided weakness, R gaze preference in DTs.  Transferred to [facility]; dx right basal ganglia ICH likely secondary to HTN.      SLP Plan  Continue with current plan of care       Recommendations  Diet recommendations: Dysphagia 3 (mechanical soft);Honey-thick liquid Liquids provided via: Cup;Straw Medication Administration: Whole meds with liquid Supervision: Patient able to self feed;Full supervision/cueing for compensatory strategies Compensations: Slow rate;Small sips/bites Postural Changes and/or Swallow Maneuvers: Seated upright 90 degrees                Oral Care Recommendations: Oral care BID Follow up Recommendations: Skilled Nursing facility SLP Visit Diagnosis: Dysphagia, oropharyngeal phase (R13.12) Plan: Continue with current plan of care       GO                Blenda Mounts Laurice 03/07/2020, 3:00 PM  Lyfe Reihl L. Samson Frederic, MA CCC/SLP Acute Rehabilitation Services Office number [phone] Pager [phone]

## 2020-03-07 NOTE — Progress Notes (Signed)
Physical Therapy Treatment Patient Details Name: Tommy Garcia MRN: [id number] DOB: 1981/03/18 Today's Date: 03/07/2020    History of Present Illness Pt is a 39 y.o. male who was transferred from [facility], admitted 7/27 with BG ICH, hypertensive emergency and DT's.    PT Comments    Pt overtly restless in the bed on arrival.  After releasing restraint, it was clear pt was very impulse, moved faster than he could control safely and did not follow direction well.  Emphasis on safe transitions, sitting/standing balance, transfer and gait in a confined/homelike space.   Follow Up Recommendations  SNF     Equipment Recommendations  None recommended by PT    Recommendations for Other Services       Precautions / Restrictions Precautions Precautions: Fall    Mobility  Bed Mobility Overal bed mobility: Needs Assistance Bed Mobility: Supine to Sit     Supine to sit: Min assist     General bed mobility comments: cues for direction, minimal assist for LE and stability to scoot OOB  Transfers Overall transfer level: Needs assistance   Transfers: Sit to/from Stand Sit to Stand: Min assist;+2 safety/equipment         General transfer comment: cues for hand placement/general safety, steady assist (x5)  Ambulation/Gait Ambulation/Gait assistance: Mod assist Gait Distance (Feet): 10 Feet (x5) Assistive device: IV Pole Gait Pattern/deviations: Step-through pattern;Step-to pattern   Gait velocity interpretation: <1.31 ft/sec, indicative of household ambulator General Gait Details: Very erradic step length/patterning.  Incoordination of steps, trunk and IV pole.  Needed 2 person assist for impulsivity.   Stairs             Wheelchair Mobility    Modified Rankin (Stroke Patients Only) Modified Rankin (Stroke Patients Only) Pre-Morbid Rankin Score: No symptoms Modified Rankin: Moderately severe disability     Balance Overall balance assessment: Needs assistance    Sitting balance-Leahy Scale: Fair Sitting balance - Comments: could maintain static balance and and accept very low challenge without loss of balance.     Standing balance-Leahy Scale: Poor                              Cognition Arousal/Alertness: Awake/alert Behavior During Therapy: Impulsive;Restless Overall Cognitive Status: Impaired/Different from baseline                   Orientation Level: Situation;Time Current Attention Level: Sustained   Following Commands: Follows one step commands inconsistently Safety/Judgement: Decreased awareness of safety;Decreased awareness of deficits Awareness: Intellectual Problem Solving: Difficulty sequencing;Slow processing        Exercises      General Comments        Pertinent Vitals/Pain Pain Assessment: Faces Faces Pain Scale: No hurt Pain Intervention(s): Monitored during session    Home Living                      Prior Function            PT Goals (current goals can now be found in the care plan section) Acute Rehab PT Goals Patient Stated Goal: unable to state PT Goal Formulation: With patient/family Time For Goal Achievement: 03/11/20 Potential to Achieve Goals: Good Progress towards PT goals: Progressing toward goals    Frequency    Min 3X/week      PT Plan Current plan remains appropriate;Frequency needs to be updated    Co-evaluation  AM-PAC PT "6 Clicks" Mobility   Outcome Measure  Help needed turning from your back to your side while in a flat bed without using bedrails?: A Little Help needed moving from lying on your back to sitting on the side of a flat bed without using bedrails?: A Little Help needed moving to and from a bed to a chair (including a wheelchair)?: A Lot Help needed standing up from a chair using your arms (e.g., wheelchair or bedside chair)?: A Lot Help needed to walk in hospital room?: A Lot Help needed climbing 3-5 steps with a  railing? : Total 6 Click Score: 13    End of Session   Activity Tolerance: Patient tolerated treatment well Patient left: in chair;with call bell/phone within reach;with chair alarm set;Other (comment) (posey) Nurse Communication: Mobility status PT Visit Diagnosis: Other abnormalities of gait and mobility (R26.89);Hemiplegia and hemiparesis;Muscle weakness (generalized) (M62.81) Hemiplegia - Right/Left: Left Hemiplegia - dominant/non-dominant: Non-dominant Hemiplegia - caused by: Nontraumatic intracerebral hemorrhage     Time: 6629-4765 PT Time Calculation (min) (ACUTE ONLY): 26 min  Charges:  $Gait Training: 8-22 mins $Therapeutic Activity: 8-22 mins                     03/07/2020  Jacinto Halim., PT Acute Rehabilitation Services [phone]  (pager) [phone]  (office)   Eliseo Gum Keiva Dina 03/07/2020, 8:15 PM

## 2020-03-08 LAB — GLUCOSE, CAPILLARY: Glucose-Capillary: 96 mg/dL (ref 70–99)

## 2020-03-08 NOTE — Progress Notes (Signed)
[facility]  PROGRESS NOTE  Tommy Garcia XFG:[id number] DOB: [birth year] DOA: 02/23/2020 PCP: Tommy Hawking, PA-C   Brief HPI:   39 year old male with history of alcohol abuse, hypertension, presented to [facility] with complaints of bilateral leg weakness and on examination was noted to have left hemiparesis.  CT head showed right basal ganglia bleed and patient was transferred to [facility].  Patient was found to be in extensive DTs, PCCM was consulted for DTs management.  Repeat CT head showed stability of bleed.  Neurology has signed off.    Subjective   Patient seen and examined, he is much more alert.  No longer confused.  Communicating well.  Continues to be weak when working with physical therapy.   Assessment/Plan:     1. Hemorrhagic stroke-right basal ganglia intracerebral hemorrhage, secondary to hypertension.  CT head showed right basilar hemorrhage with right to left midline shift.  Repeat CT head on 02/26/2020 showed stable hematoma mildly increased midline shift and mass-effect.  SBP goal less than 160.  Neurology has signed off.  Patient to go to skilled nursing facility. 2. Hypertensive emergency-patient was found to be in hypertensive emergency, started on Cleviprex which has been weaned off.  Continue amlodipine 10 mg daily.  Blood pressures controlled.  Goal SBP less than 160.  Continue hydralazine 10 mg IV every 6 hours as needed for SBP greater than 160. 3. Alcohol withdrawal-significantly improved.  Continue CIWA protocol with Ativan.  Precedex has been weaned off.  Continue thiamine/folate.  Continue Seroquel 25 mg p.o. twice daily. 4. Severe protein calorie malnutrition/dysphagia-swallow evaluation obtained, patient did not pass swallow evaluation.  Cortrack feeding tube was placed on 02/26/2020.  Patient pulled out feeding tube.  Speech therapy was consulted, patient underwent swallow evaluation with MBS.  Started on dysphagia 3 diet.   5. Hyponatremia-likely SIADH vs cerebral salt wasting syndrome from ICH.  Serum sodium has improved 130.  Serum osmolality is 274.  Follow BMP in a.m.   Scheduled medications:   .  stroke: mapping our early stages of recovery book   Does not apply Once  . amLODipine  10 mg Oral Daily  . chlorhexidine  15 mL Mouth Rinse BID  . Chlorhexidine Gluconate Cloth  6 each Topical Daily  . folic acid  1 mg Oral Daily  . heparin injection (subcutaneous)  5,000 Units Subcutaneous Q8H  . labetalol  20 mg Intravenous Once  . mouth rinse  15 mL Mouth Rinse q12n4p  . multivitamin with minerals  1 tablet Oral Daily  . pantoprazole sodium  40 mg Oral Daily  . QUEtiapine  25 mg Oral BID  . senna-docusate  1 tablet Oral BID  . thiamine  100 mg Oral Daily         CBG: Recent Labs  Lab 03/07/20 1231 03/07/20 1628 03/07/20 1958 03/07/20 2323 03/08/20 0332  GLUCAP 102* 119* 137* 122* 96    SpO2: 100 % O2 Flow Rate (L/min): 2 L/min    CBC: No results for input(s): WBC, NEUTROABS, HGB, HCT, MCV, PLT in the last 168 hours.  Basic Metabolic Panel: Recent Labs  Lab 03/03/20 0225  NA 130*  K 4.6  CL 95*  CO2 25  GLUCOSE 101*  BUN 13  CREATININE 0.55*  CALCIUM 9.7       Antibiotics: Anti-infectives (From admission, onward)   None       DVT prophylaxis: Heparin, confirmed with Dr. Pearlean Garcia.    Code Status: Full code  Family  Communication: Discussed with mother at bedside    Status is: Inpatient  Dispo: The patient is from: Home              Anticipated d/c is to: Skilled nursing facility              Anticipated d/c date is: 03/10/2020              Patient currently medically stable for discharge  Barrier to discharge-awaiting bed at skilled nursing facility       Consultants:  Neurology  PCCM  Procedures:  EEG   Objective   Vitals:   03/07/20 2324 03/08/20 0334 03/08/20 0500 03/08/20 0727  BP: 123/86 129/85  114/85  Pulse: 79 65  70  Resp: 17 18   14   Temp: 98.7 F (37.1 C) 98.2 F (36.8 C)  98.9 F (37.2 C)  TempSrc: Oral   Oral  SpO2: 99% 100%  100%  Weight:   71.4 kg   Height:        Intake/Output Summary (Last 24 hours) at 03/08/2020 1038 Last data filed at 03/08/2020 0753 Gross per 24 hour  Intake 150 ml  Output 1450 ml  Net -1300 ml    08/08 1901 - 08/10 0700 In: 150 [P.O.:150] Out: 1650 [Urine:1650]  Filed Weights   03/06/20 0351 03/07/20 0153 03/08/20 0500  Weight: 73.5 kg 71.1 kg 71.4 kg    Physical Examination:   General-appears in no acute distress Heart-S1-S2, regular, no murmur auscultated Lungs-clear to auscultation bilaterally, no wheezing or crackles auscultated Abdomen-soft, nontender, no organomegaly Extremities-no edema in the lower extremities Neuro-alert, oriented x3, no focal deficit noted      Tommy Garcia   [facility] If 7PM-7AM, please contact night-coverage at [email or web address], Office  [phone]   03/08/2020, 10:38 AM  LOS: 14 days

## 2020-03-08 NOTE — Progress Notes (Signed)
Occupational Therapy Treatment Patient Details Name: Tommy Garcia MRN: [id number] DOB: [birth year] Today's Date: 03/08/2020    History of present illness Pt is a 39 y.o. male who was transferred from [facility], admitted 7/27 with BG ICH, hypertensive emergency and DT's.   OT comments  Patient continues to make steady progress towards goals in skilled OT session. Patient's session encompassed neuromuscular re-education with PT due to pt's lack of safety awareness, impulsivity, and lack of coordination requiring two sets of skilled hands in order to progress with tasks. Pt remains impulsive in all movements, requiring max multi-modal cues to complete tasks. Pt with increased ability to pause movement with commands when scissoring gait was noted, but as soon as pt saw room began to rush movements to sit down. Discharge remains appropriate at this time, will continue to follow acutely.    Follow Up Recommendations  SNF    Equipment Recommendations  Other (comment) (defer to next venue)    Recommendations for Other Services      Precautions / Restrictions Precautions Precautions: Fall Precaution Comments: wrist restraints, mitts, posey belt, NG tube, watch HR Restrictions Weight Bearing Restrictions: No       Mobility Bed Mobility Overal bed mobility: Needs Assistance Bed Mobility: Supine to Sit     Supine to sit: Min assist     General bed mobility comments: cues for direction, minimal assist for LE and stability to scoot OOB  Transfers Overall transfer level: Needs assistance Equipment used: 2 person hand held assist Transfers: Sit to/from Stand Sit to Stand: Min assist;+2 safety/equipment         General transfer comment: cues for hand placement/general safety, steady assist    Balance Overall balance assessment: Needs assistance Sitting-balance support: Bilateral upper extremity supported;Feet supported Sitting balance-Leahy Scale: Fair Sitting balance - Comments: could  maintain static balance and and accept very low challenge without loss of balance.   Standing balance support: Bilateral upper extremity supported Standing balance-Leahy Scale: Poor Standing balance comment: pt required +2 mod assist for safety in static stance with BUE support.                           ADL either performed or assessed with clinical judgement   ADL Overall ADL's : Needs assistance/impaired Eating/Feeding: Minimal assistance;Set up;Cueing for sequencing;Cueing for compensatory techinques Eating/Feeding Details (indicate cue type and reason): Cues to incorporate L hand, min A to open packaging         Lower Body Bathing: Sitting/lateral leans;Moderate assistance Lower Body Bathing Details (indicate cue type and reason): tremulous when attempting to don socks, unable to start socks, but able to pull up and adjust                     Functional mobility during ADLs: Maximal assistance;+2 for physical assistance;+2 for safety/equipment;Cueing for safety;Cueing for sequencing General ADL Comments: Tremors remain, max multi-modal cues required to adhere to directions     Vision       Perception     Praxis      Cognition Arousal/Alertness: Awake/alert Behavior During Therapy: Impulsive;Restless Overall Cognitive Status: Impaired/Different from baseline Area of Impairment: Following commands;Safety/judgement;Awareness;Problem solving;Orientation                 Orientation Level: Situation;Time Current Attention Level: Sustained   Following Commands: Follows one step commands inconsistently Safety/Judgement: Decreased awareness of safety;Decreased awareness of deficits Awareness: Intellectual Problem Solving: Difficulty sequencing;Slow processing General  Comments: Pt requiring multimodal cueing for technique and sequencing, difficulty even with simple cues/commands as pt is very restless and distracted. Very impulsive with mobility with  no insight to safety/deficits.        Exercises     Shoulder Instructions       General Comments Ended up in the chair with posey alarm to allow for tolerance to upright.    Pertinent Vitals/ Pain       Pain Assessment: No/denies pain Faces Pain Scale: No hurt Pain Intervention(s): Monitored during session  Home Living                                          Prior Functioning/Environment              Frequency  Min 2X/week        Progress Toward Goals  OT Goals(current goals can now be found in the care plan section)  Progress towards OT goals: Progressing toward goals  Acute Rehab OT Goals Patient Stated Goal: unable to state OT Goal Formulation: Patient unable to participate in goal setting Time For Goal Achievement: 03/11/20 Potential to Achieve Goals: Good  Plan Discharge plan remains appropriate    Co-evaluation      Reason for Co-Treatment: Complexity of the patient's impairments (multi-system involvement);Necessary to address cognition/behavior during functional activity;For patient/therapist safety;To address functional/ADL transfers PT goals addressed during session: Mobility/safety with mobility;Balance OT goals addressed during session: ADL's and self-care;Strengthening/ROM      AM-PAC OT "6 Clicks" Daily Activity     Outcome Measure   Help from another person eating meals?: A Lot Help from another person taking care of personal grooming?: A Lot Help from another person toileting, which includes using toliet, bedpan, or urinal?: A Lot Help from another person bathing (including washing, rinsing, drying)?: A Lot Help from another person to put on and taking off regular upper body clothing?: A Lot Help from another person to put on and taking off regular lower body clothing?: A Lot 6 Click Score: 12    End of Session Equipment Utilized During Treatment: Other (comment) (Two person HHA)  OT Visit Diagnosis: Unsteadiness  on feet (R26.81);Cognitive communication deficit (R41.841);Pain;Muscle weakness (generalized) (M62.81);Other symptoms and signs involving cognitive function Symptoms and signs involving cognitive functions: Other Nontraumatic ICH   Activity Tolerance Patient tolerated treatment well   Patient Left in chair;with call bell/phone within reach;with chair alarm set;with restraints reapplied   Nurse Communication Mobility status        Time: 1027-2536 OT Time Calculation (min): 29 min  Charges: OT General Charges $OT Visit: 1 Visit OT Treatments $Neuromuscular Re-education: 8-22 mins  Pollyann Glen E. Pati Thinnes, COTA/L Acute Rehabilitation Services [phone]   Cherlyn Cushing 03/08/2020, 4:26 PM

## 2020-03-08 NOTE — Plan of Care (Signed)
  Problem: Activity: Goal: Risk for activity intolerance will decrease Outcome: Progressing   Problem: Nutrition: Goal: Adequate nutrition will be maintained Outcome: Progressing   Problem: Coping: Goal: Level of anxiety will decrease Outcome: Progressing   

## 2020-03-08 NOTE — NC FL2 (Signed)
[place] MEDICAID FL2 LEVEL OF CARE SCREENING TOOL     IDENTIFICATION  Patient Name: Tommy Garcia Birthdate: 10-Apr-1981 Sex: male Admission Date (Current Location): 02/23/2020  [facility] and [place] Number:  Producer, television/film/video and Address:  [facility], [address] [place], [place] [address]      Provider Number: [id number]  Attending Physician Name and Address:  Meredeth Ide, MD  Relative Name and Phone Number:       Current Level of Care: Hospital Recommended Level of Care: Skilled Nursing Facility Prior Approval Number:    Date Approved/Denied:   PASRR Number: [id number]  Discharge Plan: SNF    Current Diagnoses: Patient Active Problem List   Diagnosis Date Noted  . Malnutrition of moderate degree 02/26/2020  . Alcohol withdrawal syndrome with complication (HCC)   . DTs (delirium tremens) (HCC)   . ICH (intracerebral hemorrhage) (HCC) 02/23/2020  . Hypertension 10/29/2013  . ABSCESS, TOOTH 03/10/2009    Orientation RESPIRATION BLADDER Height & Weight     Self, Place  Normal Incontinent Weight: 157 lb 6.5 oz (71.4 kg) Height:  5\' 11"  (180.3 cm)  BEHAVIORAL SYMPTOMS/MOOD NEUROLOGICAL BOWEL NUTRITION STATUS      Incontinent Diet (see DC summary)  AMBULATORY STATUS COMMUNICATION OF NEEDS Skin   Extensive Assist Verbally Normal                       Personal Care Assistance Level of Assistance  Bathing, Feeding, Dressing Bathing Assistance: Maximum assistance Feeding assistance: Limited assistance Dressing Assistance: Maximum assistance     Functional Limitations Info             SPECIAL CARE FACTORS FREQUENCY  PT (By licensed PT), OT (By licensed OT), Speech therapy     PT Frequency: 5x/wk OT Frequency: 5x/wk     Speech Therapy Frequency: 5x/wk      Contractures Contractures Info: Not present    Additional Factors Info  Code Status, Allergies Code Status Info: Full Allergies Info: NKA            Current Medications (03/08/2020):  This is the current hospital active medication list Current Facility-Administered Medications  Medication Dose Route Frequency Provider Last Rate Last Admin  .  stroke: mapping our early stages of recovery book   Does not apply Once 05/08/2020, MD      . 0.9 %  sodium chloride infusion   Intravenous PRN Milon Dikes, MD   Stopped at 02/29/20 1056  . acetaminophen (TYLENOL) 160 MG/5ML solution 650 mg  650 mg Oral Q4H PRN 04/30/20, MD       Or  . acetaminophen (TYLENOL) tablet 650 mg  650 mg Oral Q4H PRN Meredeth Ide, MD       Or  . acetaminophen (TYLENOL) suppository 650 mg  650 mg Rectal Q4H PRN Meredeth Ide, MD      . amLODipine (NORVASC) tablet 10 mg  10 mg Oral Daily Meredeth Ide, MD   10 mg at 03/08/20 1014  . chlorhexidine (PERIDEX) 0.12 % solution 15 mL  15 mL Mouth Rinse BID 05/08/20, MD   15 mL at 03/08/20 1014  . Chlorhexidine Gluconate Cloth 2 % PADS 6 each  6 each Topical Daily 05/08/20, MD   6 each at 03/08/20 1018  . folic acid (FOLVITE) tablet 1 mg  1 mg Oral Daily 05/08/20, MD   1 mg at 03/08/20  1015  . heparin injection 5,000 Units  5,000 Units Subcutaneous Q8H Marvel Plan, MD   5,000 Units at 03/08/20 0650  . hydrALAZINE (APRESOLINE) injection 10 mg  10 mg Intravenous Q6H PRN Raymon Mutton F, NP   10 mg at 02/28/20 0104  . labetalol (NORMODYNE) injection 20 mg  20 mg Intravenous Once Milon Dikes, MD      . LORazepam (ATIVAN) injection 1 mg  1 mg Intravenous Q1H PRN Rejeana Brock, MD   1 mg at 03/07/20 2310  . MEDLINE mouth rinse  15 mL Mouth Rinse q12n4p Marvel Plan, MD   15 mL at 03/08/20 1159  . multivitamin with minerals tablet 1 tablet  1 tablet Oral Daily Meredeth Ide, MD   1 tablet at 03/08/20 1015  . pantoprazole sodium (PROTONIX) 40 mg/20 mL oral suspension 40 mg  40 mg Oral Daily Meredeth Ide, MD   40 mg at 03/08/20 1014  . polyethylene glycol (MIRALAX / GLYCOLAX) packet 17 g  17 g Oral Daily PRN  Meredeth Ide, MD      . QUEtiapine (SEROQUEL) tablet 25 mg  25 mg Oral BID Meredeth Ide, MD   25 mg at 03/08/20 1015  . senna-docusate (Senokot-S) tablet 1 tablet  1 tablet Oral BID Meredeth Ide, MD   1 tablet at 03/08/20 1015  . sodium chloride 0.9 % 1,000 mL with potassium chloride 30 mEq infusion   Intravenous Continuous Marvel Plan, MD 50 mL/hr at 03/07/20 1754 New Bag at 03/07/20 1754  . thiamine tablet 100 mg  100 mg Oral Daily Meredeth Ide, MD   100 mg at 03/08/20 1015     Discharge Medications: Please see discharge summary for a list of discharge medications.  Relevant Imaging Results:  Relevant Lab Results:   Additional Information SS#: [id number]  Baldemar Lenis, LCSW

## 2020-03-08 NOTE — Progress Notes (Signed)
Physical Therapy Treatment Patient Details Name: Tommy Garcia MRN: [id number] DOB: 12-20-1980 Today's Date: 03/08/2020    History of Present Illness Pt is a 39 y.o. male who was transferred from [facility], admitted 7/27 with BG ICH, hypertensive emergency and DT's.    PT Comments    Still restless and tremulous.  Though impulsive, therapists were able to get him to stop during ambulation and regroup without pushing on through sloppily.  Emphasis on safety in general and specifically for transfers, gait stability/quality and balance overall.    Follow Up Recommendations  SNF     Equipment Recommendations  None recommended by PT    Recommendations for Other Services Rehab consult     Precautions / Restrictions Precautions Precautions: Fall Precaution Comments: wrist restraints, mitts, posey belt, NG tube, watch HR    Mobility  Bed Mobility Overal bed mobility: Needs Assistance Bed Mobility: Supine to Sit     Supine to sit: Min assist     General bed mobility comments: cues for direction, minimal assist for LE and stability to scoot OOB  Transfers Overall transfer level: Needs assistance   Transfers: Sit to/from Stand Sit to Stand: Min assist;+2 safety/equipment         General transfer comment: cues for hand placement/general safety, steady assist  Ambulation/Gait Ambulation/Gait assistance: Mod assist;+2 physical assistance Gait Distance (Feet): 180 Feet Assistive device: IV Pole;1 person hand held assist Gait Pattern/deviations: Step-through pattern;Decreased step length - right;Decreased step length - left;Decreased stride length;Staggering right;Staggering left;Scissoring Gait velocity: moderate speed too out of control. Gait velocity interpretation: 1.31 - 2.62 ft/sec, indicative of limited community ambulator General Gait Details: Pt need consistent assist bil and truncal assist to help him coordinate steps and trunk.  Pt was significantly unsteady, needing  regrouping frequently   Stairs             Wheelchair Mobility    Modified Rankin (Stroke Patients Only) Modified Rankin (Stroke Patients Only) Pre-Morbid Rankin Score: No symptoms Modified Rankin: Moderately severe disability     Balance Overall balance assessment: Needs assistance Sitting-balance support: Bilateral upper extremity supported;Feet supported Sitting balance-Leahy Scale: Fair Sitting balance - Comments: could maintain static balance and and accept very low challenge without loss of balance.   Standing balance support: Bilateral upper extremity supported Standing balance-Leahy Scale: Poor                              Cognition Arousal/Alertness: Awake/alert Behavior During Therapy: Impulsive;Restless Overall Cognitive Status: Impaired/Different from baseline Area of Impairment: Following commands;Safety/judgement;Awareness;Problem solving;Orientation                 Orientation Level: Situation;Time Current Attention Level: Sustained   Following Commands: Follows one step commands inconsistently Safety/Judgement: Decreased awareness of safety;Decreased awareness of deficits Awareness: Intellectual Problem Solving: Difficulty sequencing;Slow processing General Comments: Pt requiring multimodal cueing for technique and sequencing, difficulty even with simple cues/commands as pt is very restless and distracted. Very impulsive with mobility with no insight to safety/deficits.      Exercises      General Comments General comments (skin integrity, edema, etc.): Ended up in the chair with posey alarm to allow for tolerance to upright.      Pertinent Vitals/Pain Pain Assessment: 0-10 Faces Pain Scale: No hurt Pain Intervention(s): Monitored during session    Home Living  Prior Function            PT Goals (current goals can now be found in the care plan section) Acute Rehab PT Goals Patient Stated  Goal: unable to state PT Goal Formulation: With patient/family Time For Goal Achievement: 03/11/20 Potential to Achieve Goals: Good Progress towards PT goals: Progressing toward goals    Frequency    Min 3X/week      PT Plan Current plan remains appropriate;Frequency needs to be updated    Co-evaluation              AM-PAC PT "6 Clicks" Mobility   Outcome Measure  Help needed turning from your back to your side while in a flat bed without using bedrails?: A Little Help needed moving from lying on your back to sitting on the side of a flat bed without using bedrails?: A Little Help needed moving to and from a bed to a chair (including a wheelchair)?: A Lot Help needed standing up from a chair using your arms (e.g., wheelchair or bedside chair)?: A Lot Help needed to walk in hospital room?: A Lot Help needed climbing 3-5 steps with a railing? : Total 6 Click Score: 13    End of Session   Activity Tolerance: Patient tolerated treatment well Patient left: in chair;with call bell/phone within reach;with chair alarm set;Other (comment) Nurse Communication: Mobility status PT Visit Diagnosis: Other abnormalities of gait and mobility (R26.89);Hemiplegia and hemiparesis;Muscle weakness (generalized) (M62.81) Hemiplegia - Right/Left: Left Hemiplegia - dominant/non-dominant: Non-dominant Hemiplegia - caused by: Nontraumatic intracerebral hemorrhage     Time: 4742-5956 PT Time Calculation (min) (ACUTE ONLY): 27 min  Charges:  $Gait Training: 8-22 mins                     03/08/2020  Jacinto Halim., PT Acute Rehabilitation Services [phone]  (pager) [phone]  (office)   Eliseo Gum Charlies Rayburn 03/08/2020, 4:06 PM

## 2020-03-09 DIAGNOSIS — I10 Essential (primary) hypertension: Secondary | ICD-10-CM

## 2020-03-09 DIAGNOSIS — I161 Hypertensive emergency: Secondary | ICD-10-CM

## 2020-03-09 DIAGNOSIS — E43 Unspecified severe protein-calorie malnutrition: Secondary | ICD-10-CM

## 2020-03-09 DIAGNOSIS — E871 Hypo-osmolality and hyponatremia: Secondary | ICD-10-CM

## 2020-03-09 LAB — CBC
HCT: 32.5 % — ABNORMAL LOW (ref 39.0–52.0)
Hemoglobin: 10.8 g/dL — ABNORMAL LOW (ref 13.0–17.0)
MCH: 32.4 pg (ref 26.0–34.0)
MCHC: 33.2 g/dL (ref 30.0–36.0)
MCV: 97.6 fL (ref 80.0–100.0)
Platelets: 721 10*3/uL — ABNORMAL HIGH (ref 150–400)
RBC: 3.33 MIL/uL — ABNORMAL LOW (ref 4.22–5.81)
RDW: 12.9 % (ref 11.5–15.5)
WBC: 6.4 10*3/uL (ref 4.0–10.5)
nRBC: 0 % (ref 0.0–0.2)

## 2020-03-09 LAB — COMPREHENSIVE METABOLIC PANEL
ALT: 42 U/L (ref 0–44)
AST: 42 U/L — ABNORMAL HIGH (ref 15–41)
Albumin: 3.2 g/dL — ABNORMAL LOW (ref 3.5–5.0)
Alkaline Phosphatase: 111 U/L (ref 38–126)
Anion gap: 12 (ref 5–15)
BUN: 9 mg/dL (ref 6–20)
CO2: 22 mmol/L (ref 22–32)
Calcium: 9.8 mg/dL (ref 8.9–10.3)
Chloride: 98 mmol/L (ref 98–111)
Creatinine, Ser: 0.47 mg/dL — ABNORMAL LOW (ref 0.61–1.24)
GFR calc Af Amer: >60 mL/min (ref >60)
GFR calc non Af Amer: >60 mL/min (ref >60)
Glucose, Bld: 101 mg/dL — ABNORMAL HIGH (ref 70–99)
Potassium: 4 mmol/L (ref 3.5–5.1)
Sodium: 132 mmol/L — ABNORMAL LOW (ref 135–145)
Total Bilirubin: 0.6 mg/dL (ref 0.3–1.2)
Total Protein: 7.3 g/dL (ref 6.5–8.1)

## 2020-03-09 LAB — SARS CORONAVIRUS 2 BY RT PCR (HOSPITAL ORDER, PERFORMED IN ~~LOC~~ HOSPITAL LAB): SARS Coronavirus 2: NEGATIVE

## 2020-03-09 LAB — GLUCOSE, CAPILLARY: Glucose-Capillary: 125 mg/dL — ABNORMAL HIGH (ref 70–99)

## 2020-03-09 MED ORDER — LORAZEPAM 1 MG PO TABS
1.0000 mg | ORAL_TABLET | Freq: Three times a day (TID) | ORAL | Status: DC
  Administered 2020-03-09 – 2020-03-10 (×4): 1 mg via ORAL
  Filled 2020-03-09 (×4): qty 1

## 2020-03-09 MED ORDER — NICOTINE 14 MG/24HR TD PT24
14.0000 mg | MEDICATED_PATCH | Freq: Every day | TRANSDERMAL | Status: DC
  Administered 2020-03-09 – 2020-03-10 (×2): 14 mg via TRANSDERMAL
  Filled 2020-03-09 (×2): qty 1

## 2020-03-09 NOTE — Progress Notes (Signed)
PROGRESS NOTE    Tommy Garcia  KKX:[id number] DOB: November 07, 1980 DOA: 02/23/2020 PCP: Jacquelin Hawking, PA-C    Brief Narrative:  Patient was admitted to the hospital with the working diagnosis of hemorrhagic right basal ganglia stroke related to uncontrolled hypertension/hypertensive emergency, complicated with acute withdrawal syndrome.   39 year old male with past medical history hypertension alcohol abuse who presented to [facility] July 27 with left-sided weakness he was found down at his home.  On his initial physical examination his blood pressure was 127/112, heart rate 62, temperature 99.5, respiratory rate 25, oxygen saturation 98% patient was confused, not able to follow commands, his lungs are clear to auscultation bilaterally, heart S1-S2, present rhythmic, soft abdomen, lower extremity edema. Head CT showed new area of hemorrhage within the right basal neck related to hypertensive.  Mild midline shift right to left. EKG 91 bpm, normal axis, normal intervals, sinus rhythm, no ST segment or T wave changes.  Patient developed severe withdrawals, he was placed on dexmedetomidine infusion along with multivitamins and anxiolytics. He was successfully transitioned to lorazepam with good toleration.  For his hemorrhagic stroke, follow-up CT showed stable hematoma mildly increased midline shift and mass-effect.  Neurology recommended close follow-up. He required tube feedings during his hospitalization, now upgraded to dysphagia 3 diet.   Physical therapy has recommended skilled nursing facility.  Assessment & Plan:   Principal Problem:   ICH (intracerebral hemorrhage) (HCC) Active Problems:   Hypertension   Alcohol withdrawal syndrome with complication (HCC)   Malnutrition of moderate degree   Hypertensive emergency   Hyponatremia   Protein-calorie malnutrition, severe (HCC)   1. Acute hemorrhagic stroke, right basal ganglia, related to hypertensive emergency. Patient  today is awake and alert. He has bilateral upper extremities soft wrist restrains.   Blood pressure this am 114/80 mmHG. Continue amlodipine 10 mg daily.  Continue neuro checks, PT, OT.   2. Alcohol withdrawal syndrome. Patient on restrains this am. Positive mild hand tremors, but not agitated. Follows commands and responds to questions appropriately.   Continue with as needed lorazepam, IV and scheduled tid PO in exchange to seroquel. OK to discontinue telemetry.   3. Severe calorie protein malnutrition/ swallow dysfunction. Continue with nutritional supplements, aspiration precautions and dysphagia 3 diet.   4. Hyponatremia. SIADH/ cerebral salt wasting syndrome. Serum Na today is 132. Will continue to encourage po intake, holding IV fluids for now.   Stable renal function with serum cr at 0,47. K at 4,0 and serum bicarbonate at 22.     Status is: Inpatient  Remains inpatient appropriate because:Inpatient level of care appropriate due to severity of illness   Dispo: The patient is from: Home              Anticipated d/c is to: SNF              Anticipated d/c date is: 2 days              Patient currently is not medically stable to d/c.   DVT prophylaxis: Enoxaparin   Code Status:   full  Family Communication:  No family at the bedside      Nutrition Status: Nutrition Problem: Moderate Malnutrition Etiology: chronic illness (ETOH abuse) Signs/Symptoms: moderate muscle depletion, moderate fat depletion Interventions: Tube feeding, Prostat, MVI    Consultants:   Neurology       Subjective: No nausea or vomiting, continue to be very weak and deconditioned, tolerating po well.   Objective: Vitals:  03/08/20 2338 03/09/20 0322 03/09/20 0500 03/09/20 0726  BP: 114/69 111/68  114/80  Pulse: 61 61  70  Resp: 19 13  16   Temp: 98.4 F (36.9 C) 98.4 F (36.9 C)  98.1 F (36.7 C)  TempSrc: Oral Axillary  Oral  SpO2: 100% 98%  98%  Weight:   72.2 kg   Height:         Intake/Output Summary (Last 24 hours) at 03/09/2020 0902 Last data filed at 03/09/2020 0238 Gross per 24 hour  Intake --  Output 1100 ml  Net -1100 ml   Filed Weights   03/07/20 0153 03/08/20 0500 03/09/20 0500  Weight: 71.1 kg 71.4 kg 72.2 kg    Examination:   General: Not in pain or dyspnea, deconditioned  Neurology: Awake and alert, generalized weakness E ENT: no pallor, no icterus, oral mucosa moist Cardiovascular: No JVD. S1-S2 present, rhythmic, no gallops, rubs, or murmurs. No lower extremity edema. Pulmonary: positive breath sounds bilaterally, adequate air movement, no wheezing, rhonchi or rales. Gastrointestinal. Abdomen soft and non tender Skin. No rashes Musculoskeletal: no joint deformities     Data Reviewed: I have personally reviewed following labs and imaging studies  CBC: Recent Labs  Lab 03/09/20 0435  WBC 6.4  HGB 10.8*  HCT 32.5*  MCV 97.6  PLT 721*   Basic Metabolic Panel: Recent Labs  Lab 03/03/20 0225 03/09/20 0435  NA 130* 132*  K 4.6 4.0  CL 95* 98  CO2 25 22  GLUCOSE 101* 101*  BUN 13 9  CREATININE 0.55* 0.47*  CALCIUM 9.7 9.8   GFR: Estimated Creatinine Clearance: 125.3 mL/min (A) (by C-G formula based on SCr of 0.47 mg/dL (L)). Liver Function Tests: Recent Labs  Lab 03/09/20 0435  AST 42*  ALT 42  ALKPHOS 111  BILITOT 0.6  PROT 7.3  ALBUMIN 3.2*   No results for input(s): LIPASE, AMYLASE in the last 168 hours. No results for input(s): AMMONIA in the last 168 hours. Coagulation Profile: No results for input(s): INR, PROTIME in the last 168 hours. Cardiac Enzymes: No results for input(s): CKTOTAL, CKMB, CKMBINDEX, TROPONINI in the last 168 hours. BNP (last 3 results) No results for input(s): PROBNP in the last 8760 hours. HbA1C: No results for input(s): HGBA1C in the last 72 hours. CBG: Recent Labs  Lab 03/07/20 1231 03/07/20 1628 03/07/20 1958 03/07/20 2323 03/08/20 0332  GLUCAP 102* 119* 137* 122* 96    Lipid Profile: No results for input(s): CHOL, HDL, LDLCALC, TRIG, CHOLHDL, LDLDIRECT in the last 72 hours. Thyroid Function Tests: No results for input(s): TSH, T4TOTAL, FREET4, T3FREE, THYROIDAB in the last 72 hours. Anemia Panel: No results for input(s): VITAMINB12, FOLATE, FERRITIN, TIBC, IRON, RETICCTPCT in the last 72 hours.    Radiology Studies: I have reviewed all of the imaging during this hospital visit personally     Scheduled Meds: .  stroke: mapping our early stages of recovery book   Does not apply Once  . amLODipine  10 mg Oral Daily  . chlorhexidine  15 mL Mouth Rinse BID  . Chlorhexidine Gluconate Cloth  6 each Topical Daily  . folic acid  1 mg Oral Daily  . heparin injection (subcutaneous)  5,000 Units Subcutaneous Q8H  . labetalol  20 mg Intravenous Once  . mouth rinse  15 mL Mouth Rinse q12n4p  . multivitamin with minerals  1 tablet Oral Daily  . pantoprazole sodium  40 mg Oral Daily  . QUEtiapine  25 mg Oral BID  .  senna-docusate  1 tablet Oral BID  . thiamine  100 mg Oral Daily   Continuous Infusions: . sodium chloride Stopped (02/29/20 1056)  . 0.9 % sodium chloride with kcl 50 mL/hr at 03/07/20 1754     LOS: 15 days        Sartaj Hoskin Annett Gula, MD

## 2020-03-09 NOTE — TOC Initial Note (Addendum)
Transition of Care [facility]) - Initial/Assessment Note    Patient Details  Name: Tommy Garcia MRN: [id number] Date of Birth: 09/19/1980  [facility] CM/SW Contact:    Pollie Friar, RN Phone Number: 03/09/2020, 10:25 AM  Clinical Narrative:                 CM met with the patient and his mother yesterday at the bedside. Mother provided a medicaid card. CM sent this to [facility] and they are stating it is family planning only. FC has started the medicaid/ disability process.  Mother knows someone at [facility] and asked that his information be sent to them.  Today Cm has left the admission person a voicemail to see if they would be able to offer a bed.  TOC following.  1300: [facility] has offered a bed. TOC working on LOG. Pt will need to be out of restraints for >24 hours. Agricultural consultant and MD updated. Mom also updated.   Expected Discharge Plan: Skilled Nursing Facility Barriers to Discharge: Inadequate or no insurance, SNF Pending payor source - LOG, SNF Pending Medicaid   Patient Goals and CMS Choice   CMS Medicare.gov Compare Post Acute Care list provided to:: Patient Represenative (must comment) Choice offered to / list presented to : Parent  Expected Discharge Plan and Services Expected Discharge Plan: Skilled Nursing Facility In-house Referral: Clinical Social Work Discharge Planning Services: CM Consult Post Acute Care Choice: [place]                                        Prior Living Arrangements/Services   Lives with:: Self Patient language and need for interpreter reviewed:: Yes Do you feel safe going back to the place where you live?: Yes      Need for Family Participation in Patient Care: Yes (Comment) Care giver support system in place?: No (comment)   Criminal Activity/Legal Involvement Pertinent to Current Situation/Hospitalization: No - Comment as needed  Activities of Daily Living Home Assistive Devices/Equipment:  None ADL Screening (condition at time of admission) Patient's cognitive ability adequate to safely complete daily activities?: Yes Is the patient deaf or have difficulty hearing?: No Does the patient have difficulty seeing, even when wearing glasses/contacts?: Yes Does the patient have difficulty concentrating, remembering, or making decisions?: No Patient able to express need for assistance with ADLs?: Yes Does the patient have difficulty dressing or bathing?: No Independently performs ADLs?: Yes (appropriate for developmental age) Does the patient have difficulty walking or climbing stairs?: Yes Weakness of Legs: Both Weakness of Arms/Hands: None  Permission Sought/Granted                  Emotional Assessment Appearance:: Appears stated age Attitude/Demeanor/Rapport: Engaged (some confusion)   Orientation: : Oriented to Self, Oriented to Place   Psych Involvement: No (comment)  Admission diagnosis:  ICH (intracerebral hemorrhage) ([place]) [I61.9] Alcohol withdrawal syndrome with complication ([place]) [P00.511] Nontraumatic hemorrhage of cerebral hemisphere, unspecified laterality ([place]) [I61.2] Patient Active Problem List   Diagnosis Date Noted  . Malnutrition of moderate degree 02/26/2020  . Alcohol withdrawal syndrome with complication ([place])   . DTs (delirium tremens) ([place])   . ICH (intracerebral hemorrhage) ([place]) 02/23/2020  . Hypertension 10/29/2013  . ABSCESS, TOOTH 03/10/2009   PCP:  Soyla Dryer, PA-C Pharmacy:   [facility] [address], [place]  [address] [place] [address] Phone: [phone] Fax: [phone]  [place], [facility] [place] [address] [place] [address] Phone: [phone] Fax: [phone]     Social Determinants of Health (SDOH) Interventions    Readmission Risk Interventions No flowsheet data found.

## 2020-03-10 MED ORDER — AMLODIPINE BESYLATE 10 MG PO TABS: 10.0000 mg | ORAL_TABLET | Freq: Every day | ORAL | 0 refills | Status: DC

## 2020-03-10 MED ORDER — PANTOPRAZOLE SODIUM 40 MG PO PACK: 40.0000 mg | PACK | Freq: Every day | ORAL | 0 refills | Status: DC

## 2020-03-10 MED ORDER — ADULT MULTIVITAMIN W/MINERALS CH: 1.0000 | ORAL_TABLET | Freq: Every day | ORAL | 0 refills | Status: AC

## 2020-03-10 MED ORDER — LORAZEPAM 1 MG PO TABS: 1.0000 mg | ORAL_TABLET | Freq: Four times a day (QID) | ORAL | 0 refills | Status: DC | PRN

## 2020-03-10 MED ORDER — POLYETHYLENE GLYCOL 3350 17 G PO PACK: 17.0000 g | PACK | Freq: Every day | ORAL | 0 refills | Status: DC | PRN

## 2020-03-10 NOTE — Discharge Summary (Signed)
Physician Discharge Summary  Tommy Garcia ZOX:[id number] DOB: [birth year] DOA: 02/23/2020  PCP: Jacquelin Hawking, PA-C  Admit date: 02/23/2020 Discharge date: 03/10/2020  Admitted From: home  Disposition:  SNF  Recommendations for Outpatient Follow-up and new medication changes:  1. Follow up with Jacquelin Hawking PA-C in one week.  2. Follow up with neurology as scheduled.  3. Continue with as needed lorazepam 4. Outpatient referral for alcoholic anonymous.   Home Health: na   Equipment/Devices: na    Discharge Condition: stable  CODE STATUS: full  Diet recommendation: heart healthy   Brief/Interim Summary: Patient was admitted to the hospital with the working diagnosis of hemorrhagic right basal ganglia stroke related to uncontrolled hypertension/hypertensive emergency, complicated with acute alcohol withdrawal syndrome.  39 year old male with past medical history hypertension and alcohol abuse who presented to [facility] July 27 with left-sided weakness. He was found down at his home.  On his initial physical examination his blood pressure was 127/112, heart rate 62, temperature 99.5, respiratory rate 25, oxygen saturation 98% patient was confused, not able to follow commands, his lungs were clear to auscultation bilaterally, heart S1-S2, present rhythmic, soft abdomen, lower extremity edema. Sodium 128, potassium 3.5, chloride 94, bicarb 23, glucose 104, BUN 8, creatinine 0.59, white count 5.4, hemoglobin 13.2, hematocrit 36.6, platelets 236.  SARS COVID-19 negative.  Urinalysis specific gravity 1.015, 0-5 white cells, 0-5 red cells.  Cream positive for barbiturates and benzodiazepines.  Alcohol less than 10. Head CT showed new area of hemorrhage within the right basal ganglia related to hypertensive, with positive mild midline shift right to left. EKG 91 bpm, normal axis, normal intervals, sinus rhythm, no ST segment or T wave changes.  Patient was transferred to [facility] for  further neurologic monitoring. Placed on IV clevidepine for blood pressure control and admitted to the ICU.   Patient developed severe withdrawals, he was placed on dexmedetomidine infusion along with multivitamins and anxiolytics.  He was successfully transitioned to lorazepam with good toleration.  For his hemorrhagic stroke, follow-up CT showed stable hematoma mildly increased midline shift and mass-effect.  EEG with no seizures. Neurology recommended close follow-up. He required tube feedings during his hospitalization, now upgraded to dysphagia 3 diet.   Physical therapy has recommended skilled nursing facility  1.  Acute hemorrhagic stroke, right basal ganglia, related to hypertensive emergency.  Patient was admitted to the intensive care unit, his blood pressure was controlled with intravenous Clevidepine with good toleration. Patient had further work-up with electroencephalography which was negative for active seizures.  Follow-up head CT showed stable hematoma.  His cognitive function was affected by withdrawal syndrome.  Further work-up with echocardiography showed a preserved LV systolic function.  Angiography of the head and neck showed was unremarkable.  No antithrombotic therapy was recommended in the setting of acute intracranial bleed.  He was successfully transitioned to oral antihypertensive medications.  At discharge she is taking amlodipine 10 mg daily.  2.  Acute alcohol withdrawal syndrome.  Patient had severe withdrawal, positive delirium tremens.  He was treated with dexmedetomidine infusion along with anxiolytics.  Patient did not required invasive mechanical ventilation. Patient was placed on quetiapine and lorazepam with good toleration.  Patient required transitory tube feeds for swallow dysfunction, he also required physical restraints for agitation.  Currently patient has been 24 hours of restraints, his calm and cooperative.  No further agitation. Will continue  taking lorazepam by mouth as needed.  Patient will need alcohol cessation program as an outpatient.  3.  Severe calorie protein malnutrition, swallow dysfunction.  Patient was eval by nutrition, supplements were added. Patient had a swallow evaluation, recommendations for dysphagia 3 diet.  Continue aspiration precautions.  4.  Hyponatremia, hypokalemia, hypomagnesemia, SIADH/cerebral salt wasting syndrome.  Patient received intravenous fluids and his electrolytes were corrected.  By the time of his discharge sodium 132, potassium 4.0, chloride 98, bicarb 22, glucose 101, BUN 9, serum creatinine 0.47.   Discharge Diagnoses:  Principal Problem:   ICH (intracerebral hemorrhage) (HCC) Active Problems:   Hypertension   Alcohol withdrawal syndrome with complication (HCC)   Malnutrition of moderate degree   Hypertensive emergency   Hyponatremia   Protein-calorie malnutrition, severe [facility])    Discharge Instructions  Discharge Instructions    Ambulatory referral to Neurology   Complete by: As directed    Follow up in stroke clinic at [facility] with Ihor Austin, NP in about 4 weeks. If not available, consider Dr. Delia Heady, Dr. Jamelle Rushing, or Dr. Naomie Dean.   For dc to SNF in th near future     Allergies as of 03/10/2020   No Known Allergies     Medication List    TAKE these medications   amLODipine 10 MG tablet Commonly known as: NORVASC Take 1 tablet (10 mg total) by mouth daily.   LORazepam 1 MG tablet Commonly known as: ATIVAN Take 1 tablet (1 mg total) by mouth every 6 (six) hours as needed for anxiety.   multivitamin with minerals Tabs tablet Take 1 tablet by mouth daily.   pantoprazole sodium 40 mg/20 mL Pack Commonly known as: PROTONIX Take 20 mLs (40 mg total) by mouth daily.   polyethylene glycol 17 g packet Commonly known as: MIRALAX / GLYCOLAX Take 17 g by mouth daily as needed for moderate constipation.       Follow-up  Information    [facility]. Schedule an appointment as soon as possible for a visit in 4 week(s).   Specialty: Neurology Contact information: [address] [place] [address] [phone]             No Known Allergies  Consultations:  CCM  Neurology    Procedures/Studies: EEG  Result Date: 02/24/2020 Charlsie Quest, MD     02/24/2020  6:56 PM Patient Name: Tommy Garcia MRN: [id number] Epilepsy Attending: Charlsie Quest Referring Physician/Provider: Dr Milon Dikes Date: 02/24/2020 Duration: 23.32 mins Patient history: 39yo M with right basal ganglia ICH. EEG to evaluate for seizure. Level of alertness: Awake, asleep AEDs during EEG study: ativan Technical aspects: This EEG study was done with scalp electrodes positioned according to the 10-20 International system of electrode placement. Electrical activity was acquired at a sampling rate of 500Hz  and reviewed with a high frequency filter of 70Hz  and a low frequency filter of 1Hz . EEG data were recorded continuously and digitally stored. Description: No posterior dominant. Sleep was characterized by vertex waves, sleep spindles (12 to 14 Hz), maximal frontocentral region.  EEG showed continuous generalized 3-5hz  theta-delta slowing admixed with 15 to 18 Hz beta activity distributed symmetrically and diffusely. One episode of right arm twitching was recorded at 1631. Concomitant eeg before, during and after the event didnt show any eeg change. Hyperventilation and photic stimulation were not performed.   ABNORMALITY - Continuous slow, generalized - Excessive beta, generalized IMPRESSION: This study is suggestive of moderate diffuse encephalopathy, nonspecific etiology. The excessive beta activity seen in the background is most likely due  to the effect of benzodiazepine and is a benign EEG pattern. One episode of right arm twitching was noted at 1631 without concomitant eeg change and was likely  not epileptic. Of note, focal motor seizures may not be seen on scalp eeg. Therefore, clinical correlation is recommended. No epileptiform discharges were seen throughout the recording. Charlsie Quest   CT Angio Head W or Wo Contrast  Result Date: 02/23/2020 CLINICAL DATA:  Weakness, intracranial hemorrhage EXAM: CT ANGIOGRAPHY HEAD AND NECK TECHNIQUE: Multidetector CT imaging of the head and neck was performed using the standard protocol during bolus administration of intravenous contrast. Multiplanar CT image reconstructions and MIPs were obtained to evaluate the vascular anatomy. Carotid stenosis measurements (when applicable) are obtained utilizing NASCET criteria, using the distal internal carotid diameter as the denominator. CONTRAST:  OMNIPAQUE IOHEXOL 350 MG/ML SOLN COMPARISON:  Correlation made with prior head CT FINDINGS: CTA NECK FINDINGS Aortic arch: Great vessel origins are patent. Right carotid system: Patent. No measurable stenosis at the ICA origin. Left carotid system: Patent. No measurable stenosis at the ICA origin. Vertebral arteries: Patent. Left vertebral artery is slightly dominant. Skeleton: No significant abnormality. Other neck: No mass or adenopathy. Upper chest: No apical lung mass. Mild cystic change versus centrilobular emphysema Review of the MIP images confirms the above findings CTA HEAD FINDINGS Anterior circulation: Intracranial internal carotid arteries are patent. Anterior and middle cerebral arteries are patent. There is no contrast blush or abnormal vascularity in the region parenchymal hemorrhage. Posterior circulation: Intracranial vertebral arteries, basilar artery, and posterior cerebral arteries are patent. Venous sinuses: As permitted by contrast timing, patent. Review of the MIP images confirms the above findings IMPRESSION: No abnormal vascularity in area of hemorrhage. No hemodynamically significant stenosis. Electronically Signed   By: Guadlupe Spanish M.D.    On: 02/23/2020 21:44   CT HEAD WO CONTRAST  Result Date: 02/26/2020 CLINICAL DATA:  Parenchymal hemorrhage, follow-up. EXAM: CT HEAD WITHOUT CONTRAST TECHNIQUE: Contiguous axial images were obtained from the base of the skull through the vertex without intravenous contrast. COMPARISON:  CT head without contrast 02/24/2020. FINDINGS: Brain: Right basal ganglia hemorrhage measures 2.8 x 4.8 x 1.9, approximately 13 mL, stable. Mild surrounding edema is stable. No new hemorrhage is present. Partial effacement of the right lateral ventricle is again noted. 5 mm midline shift is stable. No significant extra-axial fluid collection is present. Vascular: No hyperdense vessel or unexpected calcification. Skull: Calvarium is intact. No focal lytic or blastic lesions are present. No significant extracranial soft tissue lesion is present. Sinuses/Orbits: Chronic left maxillary sinus opacification is again seen. The paranasal sinuses and mastoid air cells are otherwise clear. The globes and orbits are within normal limits. IMPRESSION: 1. Stable appearance of right basal ganglia hemorrhage with mild surrounding edema. 2. Stable 5 mm midline shift. 3. No new hemorrhage. Electronically Signed   By: Marin Roberts M.D.   On: 02/26/2020 05:23   CT Head Wo Contrast  Result Date: 02/24/2020 CLINICAL DATA:  Stroke follow-up. EXAM: CT HEAD WITHOUT CONTRAST TECHNIQUE: Contiguous axial images were obtained from the base of the skull through the vertex without intravenous contrast. COMPARISON:  Yesterday FINDINGS: Brain: 16 cc acute hematoma centered at the right external capsule and putamen is unchanged in size and shape. Mild vasogenic edema is also stable. Local mass effect. No entrapment or acute infarct. Vascular: Recent CTA.  No acute finding Skull: No traumatic or aggressive finding Sinuses/Orbits: Completely opacified left maxillary sinus with sclerotic wall thickening. IMPRESSION: Size  stable 16 cc hematoma at the  right basal ganglia. Electronically Signed   By: Marnee Spring M.D.   On: 02/24/2020 05:21   CT HEAD WO CONTRAST  Result Date: 02/23/2020 CLINICAL DATA:  Increasing weakness EXAM: CT HEAD WITHOUT CONTRAST TECHNIQUE: Contiguous axial images were obtained from the base of the skull through the vertex without intravenous contrast. COMPARISON:  12/19/2017 FINDINGS: Brain: There is a geographic area of hemorrhage identified in the region of the basal ganglia on the right measuring 4.8 x 2.1 cm in greatest AP and transverse dimensions respectively. It extends approximately 3.9 cm in craniocaudad length. Mild surrounding edema is noted. Mild atrophic changes are seen. Chronic white matter ischemic changes are noted. No other hemorrhage is noted. No definitive intraventricular involvement is seen. Mild midline shift is noted from right to left secondary to the hemorrhage. This is approximately 4 mm in dimension. Vascular: No hyperdense vessel or unexpected calcification. Skull: Normal. Negative for fracture or focal lesion. Sinuses/Orbits: Opacification of the left maxillary antrum is noted increased from the prior exam. Orbits appear within normal limits. Other: None IMPRESSION: New area of hemorrhage within the right basal ganglia likely related to hypertensive bleed. Mild midline shift is noted from right to left. Critical Value/emergent results were called by telephone at the time of interpretation on 02/23/2020 at 7:52 pm to Dr. Meridee Score , who verbally acknowledged these results. Electronically Signed   By: Alcide Clever M.D.   On: 02/23/2020 19:53   CT Angio Neck W and/or Wo Contrast  Result Date: 02/23/2020 CLINICAL DATA:  Weakness, intracranial hemorrhage EXAM: CT ANGIOGRAPHY HEAD AND NECK TECHNIQUE: Multidetector CT imaging of the head and neck was performed using the standard protocol during bolus administration of intravenous contrast. Multiplanar CT image reconstructions and MIPs were obtained to  evaluate the vascular anatomy. Carotid stenosis measurements (when applicable) are obtained utilizing NASCET criteria, using the distal internal carotid diameter as the denominator. CONTRAST:  OMNIPAQUE IOHEXOL 350 MG/ML SOLN COMPARISON:  Correlation made with prior head CT FINDINGS: CTA NECK FINDINGS Aortic arch: Great vessel origins are patent. Right carotid system: Patent. No measurable stenosis at the ICA origin. Left carotid system: Patent. No measurable stenosis at the ICA origin. Vertebral arteries: Patent. Left vertebral artery is slightly dominant. Skeleton: No significant abnormality. Other neck: No mass or adenopathy. Upper chest: No apical lung mass. Mild cystic change versus centrilobular emphysema Review of the MIP images confirms the above findings CTA HEAD FINDINGS Anterior circulation: Intracranial internal carotid arteries are patent. Anterior and middle cerebral arteries are patent. There is no contrast blush or abnormal vascularity in the region parenchymal hemorrhage. Posterior circulation: Intracranial vertebral arteries, basilar artery, and posterior cerebral arteries are patent. Venous sinuses: As permitted by contrast timing, patent. Review of the MIP images confirms the above findings IMPRESSION: No abnormal vascularity in area of hemorrhage. No hemodynamically significant stenosis. Electronically Signed   By: Guadlupe Spanish M.D.   On: 02/23/2020 21:44   DG Swallowing Func-Speech Pathology  Result Date: 03/06/2020 Objective Swallowing Evaluation: Type of Study: MBS-Modified Barium Swallow Study  Patient Details Name: Tommy Garcia MRN: [id number] Date of Birth: 05-14-1981 Today's Date: 03/06/2020 Time: SLP Start Time (ACUTE ONLY): 1110 -SLP Stop Time (ACUTE ONLY): 1130 SLP Time Calculation (min) (ACUTE ONLY): 20 min Past Medical History: Past Medical History: Diagnosis Date . Alcohol abuse  . Hypertension  Past Surgical History: Past Surgical History: Procedure Laterality Date . HAND  TENDON SURGERY Right  . OPEN REDUCTION  INTERNAL FIXATION (ORIF) DISTAL RADIAL FRACTURE Left 12/26/2017  Procedure: OPEN REDUCTION INTERNAL FIXATION (ORIF) DISTAL RADIAL FRACTURE,;  Surgeon: Betha Loa, MD;  Location: [facility];  Service: Orthopedics;  Laterality: Left; . WRIST SURGERY Left around 1995 HPI: 39 y.o. male with history of Etoh abuse, HTN presenting to [facility] with L sided weakness, R gaze preference in DTs.  Transferred to [facility]; dx right basal ganglia ICH likely secondary to HTN.  Assessment / Plan / Recommendation CHL IP CLINICAL IMPRESSIONS 03/06/2020 Clinical Impression  Pt presents with oropharyngeal dysphagia characterized by reduced labial seal, reduced bolus cohesion, reduced lingual retraction, a pharyngeal delay, and reduced anterior laryngeal movement. He demonstrated premature spillage to the level of the valleculae and pyriform sinuses, mild vallecular residue, and inconsistent silent aspiration (PAS 7, 8). Coughing was effective in expelling some of the aspirant. No functional benefit was noted with a left head turn posture. A chin tuck reduced the frequency of aspiration with thin and nectar thick liquids but did not eliminate it and pt exhibited difficulty consistently demonstrating this posture. Pharyngeal residue was reduced with pt's independent use of secondary swallows. Pt was able to swallow the 13mm barium tablet with saliva only, but it was retained in the upper thoracic esophagus until a honey thick liquid bolus was used. A dysphagia 3 diet with honey thick liquids is recommended at this time and SLP will continue to follow pt for dysphagia treatment. SLP Visit Diagnosis Dysphagia, oropharyngeal phase (R13.12) Attention and concentration deficit following -- Frontal lobe and executive function deficit following -- Impact on safety and function Mild aspiration risk;Moderate aspiration risk   CHL IP TREATMENT RECOMMENDATION 03/06/2020 Treatment  Recommendations Therapy as outlined in treatment plan below   Prognosis 03/06/2020 Prognosis for Safe Diet Advancement Good Barriers to Reach Goals Cognitive deficits Barriers/Prognosis Comment -- CHL IP DIET RECOMMENDATION 03/06/2020 SLP Diet Recommendations Dysphagia 3 (Mech soft) solids;Honey thick liquids Liquid Administration via Cup;Straw Medication Administration Whole meds with liquid Compensations Slow rate;Small sips/bites Postural Changes --   CHL IP OTHER RECOMMENDATIONS 03/06/2020 Recommended Consults -- Oral Care Recommendations Oral care BID Other Recommendations --   CHL IP FOLLOW UP RECOMMENDATIONS 03/06/2020 Follow up Recommendations Skilled Nursing facility   [facility] IP FREQUENCY AND DURATION 03/06/2020 Speech Therapy Frequency (ACUTE ONLY) min 2x/week Treatment Duration 2 weeks      CHL IP ORAL PHASE 03/06/2020 Oral Phase Impaired Oral - Pudding Teaspoon -- Oral - Pudding Cup -- Oral - Honey Teaspoon NT Oral - Honey Cup Decreased bolus cohesion;Premature spillage Oral - Nectar Teaspoon NT Oral - Nectar Cup -- Oral - Nectar Straw Decreased bolus cohesion;Premature spillage Oral - Thin Teaspoon -- Oral - Thin Cup -- Oral - Thin Straw Decreased bolus cohesion;Premature spillage Oral - Puree Decreased bolus cohesion;Premature spillage Oral - Mech Soft Decreased bolus cohesion;Premature spillage Oral - Regular Decreased bolus cohesion;Premature spillage Oral - Multi-Consistency -- Oral - Pill Decreased bolus cohesion;Premature spillage Oral Phase - Comment --  CHL IP PHARYNGEAL PHASE 03/06/2020 Pharyngeal Phase Impaired Pharyngeal- Pudding Teaspoon -- Pharyngeal -- Pharyngeal- Pudding Cup -- Pharyngeal -- Pharyngeal- Honey Teaspoon NT Pharyngeal -- Pharyngeal- Honey Cup Reduced anterior laryngeal mobility;Pharyngeal residue - valleculae;Reduced tongue base retraction Pharyngeal -- Pharyngeal- Nectar Teaspoon NT Pharyngeal -- Pharyngeal- Nectar Cup -- Pharyngeal -- Pharyngeal- Nectar Straw Reduced anterior laryngeal  mobility;Pharyngeal residue - valleculae;Reduced tongue base retraction Pharyngeal Material enters airway, passes BELOW cords and not ejected out despite cough attempt by patient;Material enters airway, passes BELOW cords without  attempt by patient to eject out (silent aspiration) Pharyngeal- Thin Teaspoon -- Pharyngeal -- Pharyngeal- Thin Cup Reduced anterior laryngeal mobility;Pharyngeal residue - valleculae;Reduced tongue base retraction;Penetration/Aspiration before swallow Pharyngeal Material enters airway, passes BELOW cords without attempt by patient to eject out (silent aspiration);Material enters airway, passes BELOW cords and not ejected out despite cough attempt by patient Pharyngeal- Thin Straw Reduced anterior laryngeal mobility;Pharyngeal residue - valleculae;Reduced tongue base retraction;Penetration/Aspiration before swallow;Penetration/Aspiration during swallow Pharyngeal Material enters airway, passes BELOW cords and not ejected out despite cough attempt by patient;Material enters airway, passes BELOW cords without attempt by patient to eject out (silent aspiration) Pharyngeal- Puree Reduced anterior laryngeal mobility;Pharyngeal residue - valleculae;Reduced tongue base retraction Pharyngeal Material does not enter airway Pharyngeal- Mechanical Soft Reduced anterior laryngeal mobility;Pharyngeal residue - valleculae;Reduced tongue base retraction Pharyngeal -- Pharyngeal- Regular -- Pharyngeal -- Pharyngeal- Multi-consistency -- Pharyngeal -- Pharyngeal- Pill -- Pharyngeal -- Pharyngeal Comment --  CHL IP CERVICAL ESOPHAGEAL PHASE 03/06/2020 Cervical Esophageal Phase WFL Pudding Teaspoon -- Pudding Cup -- Honey Teaspoon -- Honey Cup -- Nectar Teaspoon -- Nectar Cup -- Nectar Straw -- Thin Teaspoon -- Thin Cup -- Thin Straw -- Puree -- Mechanical Soft -- Regular -- Multi-consistency -- Pill -- Cervical Esophageal Comment -- Shanika I. Vear Clock, MS, CCC-SLP Acute Rehabilitation Services Office number  [phone] Pager [phone] Scheryl Marten 03/06/2020, 2:02 PM              DG Swallowing Func-Speech Pathology  Result Date: 03/02/2020 Objective Swallowing Evaluation: Type of Study: MBS-Modified Barium Swallow Study  Patient Details Name: Tommy Garcia MRN: [id number] Date of Birth: 09-Dec-1980 Today's Date: 03/02/2020 Time: SLP Start Time (ACUTE ONLY): 1317 -SLP Stop Time (ACUTE ONLY): 1350 SLP Time Calculation (min) (ACUTE ONLY): 33 min Past Medical History: Past Medical History: Diagnosis Date . Alcohol abuse  . Hypertension  Past Surgical History: Past Surgical History: Procedure Laterality Date . HAND TENDON SURGERY Right  . OPEN REDUCTION INTERNAL FIXATION (ORIF) DISTAL RADIAL FRACTURE Left 12/26/2017  Procedure: OPEN REDUCTION INTERNAL FIXATION (ORIF) DISTAL RADIAL FRACTURE,;  Surgeon: Betha Loa, MD;  Location: [facility];  Service: Orthopedics;  Laterality: Left; . WRIST SURGERY Left around 1995 HPI: 39 y.o. male with history of Etoh abuse, HTN presenting to [facility] with L sided weakness, R gaze preference in DTs.  Transferred to [facility]; dx right basal ganglia ICH likely secondary to HTN.  Subjective: alert, impulsive Assessment / Plan / Recommendation CHL IP CLINICAL IMPRESSIONS 03/02/2020 Clinical Impression Pt presents with a moderate pharyngeal more than oral dysphagia. He does have mildly reduced awareness during bolus acceptance and reduced bolus cohesion, but impairments are more significant in the pharyngeal phase. He has reduced anterior hyoid movement, laryngeal elevation, base of tongue retraction, and laryngeal vestibule closure. There is also a component of impaired timing, with his swallow triggering at the pyriform sinuses with liquids but more at the valleculae with solids. These deficits result in aspiration before the swallow with liquids that elicits a strong cough response only with larger volumes. Mild residue remains in the vallecular post-swallow across  consistencies, with delayed coughing also noted after the testing stopped. Pt can achieve better timing and recruit more of his pharyngeal musculature when cued to "swallow hard," which may be an effective therapeutic tool, although with his current mentation I do not think he could use strategies consistently. Recommend that he remain NPO with use of temporary, alternative means of nutrition, but will f/u for pharyngeal strengthening exercises with use  of purees in therapy. Tentative plan would also include repeat MBS as mentation improves and he can participate well in swallowing therapy.  SLP Visit Diagnosis Dysphagia, oropharyngeal phase (R13.12) Attention and concentration deficit following -- Frontal lobe and executive function deficit following -- Impact on safety and function Moderate aspiration risk;Severe aspiration risk   CHL IP TREATMENT RECOMMENDATION 03/02/2020 Treatment Recommendations Therapy as outlined in treatment plan below   Prognosis 03/02/2020 Prognosis for Safe Diet Advancement Good Barriers to Reach Goals Cognitive deficits Barriers/Prognosis Comment -- CHL IP DIET RECOMMENDATION 03/02/2020 SLP Diet Recommendations NPO;Alternative means - temporary Liquid Administration via -- Medication Administration Via alternative means Compensations -- Postural Changes --   CHL IP OTHER RECOMMENDATIONS 03/02/2020 Recommended Consults -- Oral Care Recommendations Oral care QID Other Recommendations --   CHL IP FOLLOW UP RECOMMENDATIONS 03/02/2020 Follow up Recommendations Skilled Nursing facility   [facility] IP FREQUENCY AND DURATION 03/02/2020 Speech Therapy Frequency (ACUTE ONLY) min 3x week Treatment Duration 2 weeks      CHL IP ORAL PHASE 03/02/2020 Oral Phase Impaired Oral - Pudding Teaspoon -- Oral - Pudding Cup -- Oral - Honey Teaspoon Decreased bolus cohesion Oral - Honey Cup Decreased bolus cohesion;Other (Comment) Oral - Nectar Teaspoon Decreased bolus cohesion Oral - Nectar Cup -- Oral - Nectar Straw Decreased  bolus cohesion Oral - Thin Teaspoon -- Oral - Thin Cup -- Oral - Thin Straw -- Oral - Puree Decreased bolus cohesion Oral - Mech Soft Decreased bolus cohesion;Impaired mastication Oral - Regular -- Oral - Multi-Consistency -- Oral - Pill -- Oral Phase - Comment --  CHL IP PHARYNGEAL PHASE 03/02/2020 Pharyngeal Phase Impaired Pharyngeal- Pudding Teaspoon -- Pharyngeal -- Pharyngeal- Pudding Cup -- Pharyngeal -- Pharyngeal- Honey Teaspoon Reduced epiglottic inversion;Reduced anterior laryngeal mobility;Reduced laryngeal elevation;Reduced airway/laryngeal closure;Reduced tongue base retraction;Delayed swallow initiation-pyriform sinuses;Penetration/Aspiration before swallow;Pharyngeal residue - valleculae Pharyngeal Material enters airway, passes BELOW cords and not ejected out despite cough attempt by patient Pharyngeal- Honey Cup Reduced epiglottic inversion;Reduced anterior laryngeal mobility;Reduced laryngeal elevation;Reduced airway/laryngeal closure;Reduced tongue base retraction;Delayed swallow initiation-pyriform sinuses;Penetration/Aspiration before swallow Pharyngeal Material enters airway, passes BELOW cords and not ejected out despite cough attempt by patient Pharyngeal- Nectar Teaspoon Reduced epiglottic inversion;Reduced anterior laryngeal mobility;Reduced laryngeal elevation;Reduced airway/laryngeal closure;Reduced tongue base retraction;Delayed swallow initiation-pyriform sinuses;Penetration/Aspiration before swallow;Pharyngeal residue - valleculae Pharyngeal Material enters airway, passes BELOW cords without attempt by patient to eject out (silent aspiration) Pharyngeal- Nectar Cup -- Pharyngeal -- Pharyngeal- Nectar Straw Reduced epiglottic inversion;Reduced anterior laryngeal mobility;Reduced laryngeal elevation;Reduced airway/laryngeal closure;Reduced tongue base retraction;Delayed swallow initiation-pyriform sinuses;Penetration/Aspiration before swallow;Pharyngeal residue - valleculae Pharyngeal  Material enters airway, passes BELOW cords without attempt by patient to eject out (silent aspiration) Pharyngeal- Thin Teaspoon -- Pharyngeal -- Pharyngeal- Thin Cup -- Pharyngeal -- Pharyngeal- Thin Straw -- Pharyngeal -- Pharyngeal- Puree Reduced epiglottic inversion;Reduced anterior laryngeal mobility;Reduced laryngeal elevation;Reduced airway/laryngeal closure;Reduced tongue base retraction;Delayed swallow initiation-vallecula;Pharyngeal residue - valleculae Pharyngeal -- Pharyngeal- Mechanical Soft Reduced epiglottic inversion;Reduced anterior laryngeal mobility;Reduced laryngeal elevation;Reduced airway/laryngeal closure;Reduced tongue base retraction;Delayed swallow initiation-vallecula;Pharyngeal residue - valleculae Pharyngeal -- Pharyngeal- Regular -- Pharyngeal -- Pharyngeal- Multi-consistency -- Pharyngeal -- Pharyngeal- Pill -- Pharyngeal -- Pharyngeal Comment --  CHL IP CERVICAL ESOPHAGEAL PHASE 03/02/2020 Cervical Esophageal Phase WFL Pudding Teaspoon -- Pudding Cup -- Honey Teaspoon -- Honey Cup -- Nectar Teaspoon -- Nectar Cup -- Nectar Straw -- Thin Teaspoon -- Thin Cup -- Thin Straw -- Puree -- Mechanical Soft -- Regular -- Multi-consistency -- Pill -- Cervical Esophageal Comment -- Mahala Menghini., M.A. CCC-SLP Acute Rehabilitation  Services Pager [phone] Office [phone] 03/02/2020, 2:28 PM              ECHOCARDIOGRAM COMPLETE  Result Date: 02/24/2020    ECHOCARDIOGRAM REPORT   Patient Name:   Tommy Garcia Date of Exam: 02/24/2020 Medical Rec #:  [id number]     Height:       71.0 in Accession #:    [id number]    Weight:       160.0 lb Date of Birth:  Mar 29, 1981     BSA:          1.918 m Patient Age:    38 years      BP:           118/73 mmHg Patient Gender: M             HR:           66 bpm. Exam Location:  Inpatient Procedure: 2D Echo Indications:    Stroke 434.91 / I163.9  History:        Patient has no prior history of Echocardiogram examinations.                 Risk Factors:Hypertension.  ETOH use. Intracerebral hemorrhage.  Sonographer:    Leta Jungling RDCS Referring Phys: [id number] [facility]  Sonographer Comments: Suboptimal apical window and suboptimal parasternal window. Echo performed when patient is sedated. IMPRESSIONS  1. Left ventricular ejection fraction, by estimation, is 50 to 55%. The left ventricle has low normal function. The left ventricle has no regional wall motion abnormalities. Left ventricular diastolic parameters are consistent with Grade I diastolic dysfunction (impaired relaxation).  2. Right ventricular systolic function is normal. The right ventricular size is normal. There is normal pulmonary artery systolic pressure.  3. The mitral valve is normal in structure. No evidence of mitral valve regurgitation. No evidence of mitral stenosis.  4. The aortic valve is tricuspid. Aortic valve regurgitation is not visualized. No aortic stenosis is present.  5. The inferior vena cava is normal in size with greater than 50% respiratory variability, suggesting right atrial pressure of 3 mmHg. FINDINGS  Left Ventricle: Left ventricular ejection fraction, by estimation, is 50 to 55%. The left ventricle has low normal function. The left ventricle has no regional wall motion abnormalities. The left ventricular internal cavity size was normal in size. There is no left ventricular hypertrophy. Left ventricular diastolic parameters are consistent with Grade I diastolic dysfunction (impaired relaxation). Normal left ventricular filling pressure. Right Ventricle: The right ventricular size is normal. No increase in right ventricular wall thickness. Right ventricular systolic function is normal. There is normal pulmonary artery systolic pressure. The tricuspid regurgitant velocity is 1.14 m/s, and  with an assumed right atrial pressure of 3 mmHg, the estimated right ventricular systolic pressure is 8.2 mmHg. Left Atrium: Left atrial size was normal in size. Right Atrium: Right atrial size was  normal in size. Pericardium: There is no evidence of pericardial effusion. Mitral Valve: The mitral valve is normal in structure. Normal mobility of the mitral valve leaflets. No evidence of mitral valve regurgitation. No evidence of mitral valve stenosis. Tricuspid Valve: The tricuspid valve is normal in structure. Tricuspid valve regurgitation is trivial. No evidence of tricuspid stenosis. Aortic Valve: The aortic valve is tricuspid. Aortic valve regurgitation is not visualized. No aortic stenosis is present. Pulmonic Valve: The pulmonic valve was normal in structure. Pulmonic valve regurgitation is not visualized. No evidence of pulmonic stenosis. Aorta: The aortic root is normal  in size and structure. Venous: The inferior vena cava is normal in size with greater than 50% respiratory variability, suggesting right atrial pressure of 3 mmHg. IAS/Shunts: No atrial level shunt detected by color flow Doppler.  LEFT VENTRICLE PLAX 2D LVIDd:         4.65 cm  Diastology LVIDs:         3.60 cm  LV e' lateral:   8.38 cm/s LV PW:         0.70 cm  LV E/e' lateral: 5.8 LV IVS:        1.05 cm  LV e' medial:    7.07 cm/s LVOT diam:     1.90 cm  LV E/e' medial:  6.9 LV SV:         43 LV SV Index:   22 LVOT Area:     2.84 cm  RIGHT VENTRICLE RV S prime:     9.14 cm/s TAPSE (M-mode): 2.1 cm LEFT ATRIUM             Index       RIGHT ATRIUM           Index LA diam:        3.90 cm 2.03 cm/m  RA Area:     12.80 cm LA Vol (A2C):   45.6 ml 23.78 ml/m RA Volume:   28.40 ml  14.81 ml/m LA Vol (A4C):   45.2 ml 23.57 ml/m LA Biplane Vol: 45.6 ml 23.78 ml/m  AORTIC VALVE LVOT Vmax:   83.50 cm/s LVOT Vmean:  59.500 cm/s LVOT VTI:    0.150 m  AORTA Ao Root diam: 2.70 cm MITRAL VALVE               TRICUSPID VALVE MV Area (PHT): 4.01 cm    TR Peak grad:   5.2 mmHg MV Decel Time: 189 msec    TR Vmax:        114.00 cm/s MV E velocity: 48.70 cm/s MV A velocity: 46.80 cm/s  SHUNTS MV E/A ratio:  1.04        Systemic VTI:  0.15 m                             Systemic Diam: 1.90 cm Chilton Si MD Electronically signed by Chilton Si MD Signature Date/Time: 02/24/2020/3:21:05 PM    Final        Subjective: Patient is calm and cooperative, no agitation or confusion, no nausea or vomiting, no chest pain or dyspnea.   Discharge Exam: Vitals:   03/10/20 0327 03/10/20 0810  BP: 120/82 112/72  Pulse: 72 78  Resp: 17 18  Temp: 98.6 F (37 C) 98.4 F (36.9 C)  SpO2: 100% 100%   Vitals:   03/09/20 2345 03/10/20 0327 03/10/20 0500 03/10/20 0810  BP: 115/67 120/82  112/72  Pulse: 71 72  78  Resp: 18 17  18   Temp: 98.9 F (37.2 C) 98.6 F (37 C)  98.4 F (36.9 C)  TempSrc: Oral Oral  Oral  SpO2: 100% 100%  100%  Weight:   72.2 kg   Height:        General: no pain or dyspnea Neurology: Awake and alert, non focal  E ENT: no pallor, no icterus, oral mucosa moist Cardiovascular: No JVD. S1-S2 present, rhythmic, no gallops, rubs, or murmurs. No lower extremity edema. Pulmonary: positive breath sounds bilaterally, Gastrointestinal. Abdomen soft and non tender Skin. No rashes  Musculoskeletal: no joint deformities   The results of significant diagnostics from this hospitalization (including imaging, microbiology, ancillary and laboratory) are listed below for reference.     Microbiology: Recent Results (from the past 240 hour(s))  SARS Coronavirus 2 by RT PCR (hospital order, performed in [facility] hospital lab) Nasopharyngeal Nasopharyngeal Swab     Status: None   Collection Time: 03/09/20  6:00 PM   Specimen: Nasopharyngeal Swab  Result Value Ref Range Status   SARS Coronavirus 2 NEGATIVE NEGATIVE Final    Comment: (NOTE) SARS-CoV-2 target nucleic acids are NOT DETECTED.  The SARS-CoV-2 RNA is generally detectable in upper and lower respiratory specimens during the acute phase of infection. The lowest concentration of SARS-CoV-2 viral copies this assay can detect is 250 copies / mL. A negative result does  not preclude SARS-CoV-2 infection and should not be used as the sole basis for treatment or other patient management decisions.  A negative result may occur with improper specimen collection / handling, submission of specimen other than nasopharyngeal swab, presence of viral mutation(s) within the areas targeted by this assay, and inadequate number of viral copies (<250 copies / mL). A negative result must be combined with clinical observations, patient history, and epidemiological information.  Fact Sheet for Patients:   BoilerBrush.com.cy  Fact Sheet for Healthcare Providers: https://pope.com/  This test is not yet approved or  cleared by the [place] FDA and has been authorized for detection and/or diagnosis of SARS-CoV-2 by FDA under an Emergency Use Authorization (EUA).  This EUA will remain in effect (meaning this test can be used) for the duration of the COVID-19 declaration under Section 564(b)(1) of the Act, 21 U.S.C. section 360bbb-3(b)(1), unless the authorization is terminated or revoked sooner.  Performed at [facility], [address] [place], [place] [address]      Labs: BNP (last 3 results) No results for input(s): BNP in the last 8760 hours. Basic Metabolic Panel: Recent Labs  Lab 03/09/20 0435  NA 132*  K 4.0  CL 98  CO2 22  GLUCOSE 101*  BUN 9  CREATININE 0.47*  CALCIUM 9.8   Liver Function Tests: Recent Labs  Lab 03/09/20 0435  AST 42*  ALT 42  ALKPHOS 111  BILITOT 0.6  PROT 7.3  ALBUMIN 3.2*   No results for input(s): LIPASE, AMYLASE in the last 168 hours. No results for input(s): AMMONIA in the last 168 hours. CBC: Recent Labs  Lab 03/09/20 0435  WBC 6.4  HGB 10.8*  HCT 32.5*  MCV 97.6  PLT 721*   Cardiac Enzymes: No results for input(s): CKTOTAL, CKMB, CKMBINDEX, TROPONINI in the last 168 hours. BNP: Invalid input(s): POCBNP CBG: Recent Labs  Lab 03/07/20 1628  03/07/20 1958 03/07/20 2323 03/08/20 0332 03/09/20 1628  GLUCAP 119* 137* 122* 96 125*   D-Dimer No results for input(s): DDIMER in the last 72 hours. Hgb A1c No results for input(s): HGBA1C in the last 72 hours. Lipid Profile No results for input(s): CHOL, HDL, LDLCALC, TRIG, CHOLHDL, LDLDIRECT in the last 72 hours. Thyroid function studies No results for input(s): TSH, T4TOTAL, T3FREE, THYROIDAB in the last 72 hours.  Invalid input(s): FREET3 Anemia work up No results for input(s): VITAMINB12, FOLATE, FERRITIN, TIBC, IRON, RETICCTPCT in the last 72 hours. Urinalysis    Component Value Date/Time   COLORURINE YELLOW 02/24/2020 1839   APPEARANCEUR CLEAR 02/24/2020 1839   LABSPEC 1.015 02/24/2020 1839   PHURINE 6.0 02/24/2020 1839   GLUCOSEU NEGATIVE  02/24/2020 1839   HGBUR SMALL (A) 02/24/2020 1839   BILIRUBINUR NEGATIVE 02/24/2020 1839   KETONESUR 5 (A) 02/24/2020 1839   PROTEINUR 30 (A) 02/24/2020 1839   NITRITE NEGATIVE 02/24/2020 1839   LEUKOCYTESUR NEGATIVE 02/24/2020 1839   Sepsis Labs Invalid input(s): PROCALCITONIN,  WBC,  LACTICIDVEN Microbiology Recent Results (from the past 240 hour(s))  SARS Coronavirus 2 by RT PCR (hospital order, performed in [facility] lab) Nasopharyngeal Nasopharyngeal Swab     Status: None   Collection Time: 03/09/20  6:00 PM   Specimen: Nasopharyngeal Swab  Result Value Ref Range Status   SARS Coronavirus 2 NEGATIVE NEGATIVE Final    Comment: (NOTE) SARS-CoV-2 target nucleic acids are NOT DETECTED.  The SARS-CoV-2 RNA is generally detectable in upper and lower respiratory specimens during the acute phase of infection. The lowest concentration of SARS-CoV-2 viral copies this assay can detect is 250 copies / mL. A negative result does not preclude SARS-CoV-2 infection and should not be used as the sole basis for treatment or other patient management decisions.  A negative result may occur with improper specimen collection /  handling, submission of specimen other than nasopharyngeal swab, presence of viral mutation(s) within the areas targeted by this assay, and inadequate number of viral copies (<250 copies / mL). A negative result must be combined with clinical observations, patient history, and epidemiological information.  Fact Sheet for Patients:   BoilerBrush.com.cyhttps://www.fda.gov/media/136312/download  Fact Sheet for Healthcare Providers: https://pope.com/https://www.fda.gov/media/136313/download  This test is not yet approved or  cleared by the [place] FDA and has been authorized for detection and/or diagnosis of SARS-CoV-2 by FDA under an Emergency Use Authorization (EUA).  This EUA will remain in effect (meaning this test can be used) for the duration of the COVID-19 declaration under Section 564(b)(1) of the Act, 21 U.S.C. section 360bbb-3(b)(1), unless the authorization is terminated or revoked sooner.  Performed at [facility], [address] [place], [place] [id number]      Time coordinating discharge: 45 minutes  SIGNED:   Coralie KeensMauricio Daniel Anasia Agro, MD  [facility] 03/10/2020, 8:16 AM

## 2020-03-10 NOTE — Progress Notes (Signed)
Physical Therapy Treatment Patient Details Name: Tommy Garcia MRN: [id number] DOB: [birth year] Today's Date: 03/10/2020    History of Present Illness Pt is a 39 y.o. male who was transferred from [facility], admitted 7/27 with BG ICH, hypertensive emergency and DT's.    PT Comments    Pt always willing.  Impulsive through out session.  Somewhat easy to redirect.  Emphasis on transitions, safe transfers and gait stability and improving gait quality with the RW.    Follow Up Recommendations  SNF     Equipment Recommendations  None recommended by PT    Recommendations for Other Services       Precautions / Restrictions Precautions Precautions: Fall    Mobility  Bed Mobility Overal bed mobility: Needs Assistance       Supine to sit: Min guard     General bed mobility comments: cues for waiting for therapist.  Stability assist once up.  Transfers Overall transfer level: Needs assistance   Transfers: Sit to/from Stand Sit to Stand: Min assist         General transfer comment: cues for hand placement/general safety, steady assist  Ambulation/Gait Ambulation/Gait assistance: Mod assist;Max assist Gait Distance (Feet): 170 Feet Assistive device: Rolling walker (2 wheeled) Gait Pattern/deviations: Step-through pattern;Decreased step length - right;Decreased step length - left;Decreased stride length;Scissoring;Drifts right/left;Narrow base of support Gait velocity: moderate speed too out of control. Gait velocity interpretation: 1.31 - 2.62 ft/sec, indicative of limited community ambulator General Gait Details: Pt still extremely uncoordinated L >R LE, trunk and upper extremities.  Gait characterized by paretic steps than adduct, scissor, L LE frequently buckles.  L UE must also be assisted to stay on the RW during assist maneuvering the RW.   Stairs             Wheelchair Mobility    Modified Rankin (Stroke Patients Only) Modified Rankin (Stroke Patients  Only) Modified Rankin: Moderately severe disability     Balance Overall balance assessment: Needs assistance Sitting-balance support: Bilateral upper extremity supported;Feet supported Sitting balance-Leahy Scale: Fair Sitting balance - Comments: Can now accept minor challenge   Standing balance support: Bilateral upper extremity supported Standing balance-Leahy Scale: Poor Standing balance comment: singificant Listing L more than right, tremulous and very reliant on external support                            Cognition Arousal/Alertness: Awake/alert Behavior During Therapy: Impulsive;Restless Overall Cognitive Status: Impaired/Different from baseline                   Orientation Level: Time Current Attention Level: Sustained   Following Commands: Follows one step commands with increased time;Follows one step commands inconsistently Safety/Judgement: Decreased awareness of safety;Decreased awareness of deficits Awareness: Intellectual Problem Solving: Difficulty sequencing;Slow processing        Exercises      General Comments        Pertinent Vitals/Pain Pain Assessment: Faces Faces Pain Scale: No hurt Pain Intervention(s): Monitored during session    Home Living                      Prior Function            PT Goals (current goals can now be found in the care plan section) Acute Rehab PT Goals Patient Stated Goal: unable to state PT Goal Formulation: With patient/family Time For Goal Achievement: 03/11/20 Potential to Achieve Goals: Good Progress  towards PT goals: Progressing toward goals    Frequency    Min 3X/week      PT Plan Current plan remains appropriate;Frequency needs to be updated    Co-evaluation              AM-PAC PT "6 Clicks" Mobility   Outcome Measure  Help needed turning from your back to your side while in a flat bed without using bedrails?: A Little Help needed moving from lying on your  back to sitting on the side of a flat bed without using bedrails?: A Little Help needed moving to and from a bed to a chair (including a wheelchair)?: A Lot Help needed standing up from a chair using your arms (e.g., wheelchair or bedside chair)?: A Lot Help needed to walk in hospital room?: A Lot Help needed climbing 3-5 steps with a railing? : Total 6 Click Score: 13    End of Session   Activity Tolerance: Patient tolerated treatment well Patient left: in bed;with call bell/phone within reach;with bed alarm set;with family/visitor present Nurse Communication: Mobility status PT Visit Diagnosis: Unsteadiness on feet (R26.81);Other abnormalities of gait and mobility (R26.89) Hemiplegia - Right/Left: Left Hemiplegia - dominant/non-dominant: Non-dominant Hemiplegia - caused by: Nontraumatic intracerebral hemorrhage     Time: 1255-1314 PT Time Calculation (min) (ACUTE ONLY): 19 min  Charges:  $Gait Training: 8-22 mins                     03/10/2020  Jacinto Halim., PT Acute Rehabilitation Services [phone]  (pager) [phone]  (office)   [name] 03/10/2020, 5:39 PM

## 2020-03-10 NOTE — TOC Transition Note (Signed)
Transition of Care [facility]) - CM/SW Discharge Note   Patient Details  Name: Tommy Garcia MRN: [id number] Date of Birth: [birth year]  [facility] CM/SW Contact:  Baldemar Lenis, LCSW Phone Number: 03/10/2020, 11:27 AM   Clinical Narrative:   Nurse to call report to [phone], Room 412B.  Transport set for 2:00 PM.    Final next level of care: Skilled Nursing Facility Barriers to Discharge: Barriers Resolved   Patient Goals and CMS Choice   CMS Medicare.gov Compare Post Acute Care list provided to:: Patient Represenative (must comment) Choice offered to / list presented to : Parent  Discharge Placement              Patient chooses bed at: [facility] Patient to be transferred to facility by: [facility] Name of family member notified: Rose Patient and family notified of of transfer: 03/10/20  Discharge Plan and Services In-house Referral: Clinical Social Work Discharge Planning Services: [employer] Consult Post Acute Care Choice: Skilled Nursing Facility                               Social Determinants of Health (SDOH) Interventions     Readmission Risk Interventions No flowsheet data found.

## 2020-03-10 NOTE — Progress Notes (Signed)
Report given to receiving RN.

## 2020-03-22 ENCOUNTER — Ambulatory Visit: Payer: Medicaid Other | Admitting: Physician Assistant

## 2020-04-06 ENCOUNTER — Telehealth: Payer: Self-pay | Admitting: Adult Health

## 2020-04-06 ENCOUNTER — Ambulatory Visit: Payer: MEDICAID | Admitting: Adult Health

## 2020-04-06 ENCOUNTER — Encounter: Payer: Self-pay | Admitting: Adult Health

## 2020-04-06 ENCOUNTER — Other Ambulatory Visit: Payer: Self-pay

## 2020-04-06 VITALS — BP 120/72 | HR 66 | Ht 71.0 in | Wt 175.8 lb

## 2020-04-06 DIAGNOSIS — F1011 Alcohol abuse, in remission: Secondary | ICD-10-CM

## 2020-04-06 DIAGNOSIS — I610 Nontraumatic intracerebral hemorrhage in hemisphere, subcortical: Secondary | ICD-10-CM

## 2020-04-06 DIAGNOSIS — I10 Essential (primary) hypertension: Secondary | ICD-10-CM

## 2020-04-06 NOTE — Patient Instructions (Signed)
Continue working with therapies for likley ongoing improvement  Continue aspirin 81 mg daily for secondary stroke prevention  Continue to follow up with PCP regarding cholesterol and blood pressure management  Maintain strict control of hypertension with blood pressure goal below 130/90and cholesterol with LDL cholesterol (bad cholesterol) goal below 70 mg/dL.      Followup in the future with me in 3 months or call earlier if needed       Thank you for coming to see [place] at [facility]. I hope we have been able to provide you high quality care today.  You may receive a patient satisfaction survey over the next few weeks. We would appreciate your feedback and comments so that we may continue to improve ourselves and the health of our patients.

## 2020-04-06 NOTE — Telephone Encounter (Signed)
self pay order sent to GI. They will reach out to the patient to schedule.  °

## 2020-04-06 NOTE — Progress Notes (Signed)
I agree with the above plan 

## 2020-04-06 NOTE — Progress Notes (Signed)
[facility] [address] [place].  [address] [phone]       HOSPITAL FOLLOW UP NOTE  Tommy Garcia Date of Birth:  Jan 21, 1981 Medical Record Number:  [id number]   Reason for Referral:  hospital stroke follow up    SUBJECTIVE:   CHIEF COMPLAINT:  Chief Complaint  Patient presents with  . Hospitalization Follow-up    HFU. States he has been doing well since being discharged.   . room 5    alone    HPI:   Tommy Garcia is a 39 y.o. male with history of Etoh abuse and HTN  presented on 02/23/2020 to [facility] with L sided weakness, and R gaze preference and delirium tremens.   Transfer to [facility] for further stroke work-up.  Stroke work-up revealed right basal ganglia ICH with midline shift likely secondary to HTN.  Presented in hypertensive emergency treated with Cleviprex and initiated amlodipine and lisinopril at discharge.  LDL 71.  A1c 5.1.  Tobacco use with smoking cessation counseling provided.  Substance abuse with UDS positive for benzos and barbiturates.  EtOH withdrawal with delirium tremens with use of Ativan and placed on Seroquel and thiamine/folate/MVI.  No prior stroke history.  Other active cons include acute alcohol hepatitis with transaminitis, hyponatremia, hypokalemia and hypomagnesemia placed on supplementation.  Residual deficits of left-sided weakness, dysphagia, moderate dysarthria, and cognitive impairment.  Evaluated by therapy and recommended discharge to SNF for ongoing therapy needs.  Stroke: R basal ganglia ICH likely secondary to HTN   CT head R basal ganglia hemorrhage w/ R->L midline shift   CTA head & neck Unremarkable   Repeat CT head stable hemorrhage   CT repeat 7/30 stable hematoma, mildly increased midline shift and mass-effect  2D Echo EF 50-55%. No source of embolus   EEG 7/28 - excessive beta, generalized slowing  LDL 71   HgbA1c 5.1  UDS positive for benzo and barbiturates  VTE  prophylaxis - heparin subq   No antithrombotic prior to admission, now on No antithrombotic given hemorrhage   Therapy recommendations CIR->likely SNF given benefits  Disposition:  SNF   Today, 04/06/2020, [name] is being seen for hospital follow-up Continues to reside at [facility] and rehabilitation center.  He is unaccompanied at today's visit  Patient reports residual deficits of decreased left hand dexterity and coordination as well as gait impairment and imbalance.  His lack of LUE motor control is his biggest concern Continues to work with PT/OT/SLP at facility- he does report improvement of deficits He is currently on a regular diet without difficulty and denies residual speech difficulty Ambulates with rollator walker but hopeful he will be ambulating without AD in the near future Denies new or worsening stroke/TIA symptoms  Started on aspirin 81mg  daily for elevated platelet count by facility.  Denies issues with bleeding or bruising. Blood pressure today 120/72 - stable at facility per patient  No further concerns at this time      ROS:   14 system review of systems performed and negative with exception of those reported in HPI  PMH:  Past Medical History:  Diagnosis Date  . Alcohol abuse   . Hypertension     PSH:  Past Surgical History:  Procedure Laterality Date  . HAND TENDON SURGERY Right   . OPEN REDUCTION INTERNAL FIXATION (ORIF) DISTAL RADIAL FRACTURE Left 12/26/2017   Procedure: OPEN REDUCTION INTERNAL FIXATION (ORIF) DISTAL RADIAL FRACTURE,;  Surgeon: 12/28/2017, MD;  Location: [facility];  Service: Orthopedics;  Laterality: Left;  . WRIST SURGERY Left around 1995    Social History:  Social History   Socioeconomic History  . Marital status: Divorced    Spouse name: Not on file  . Number of children: Not on file  . Years of education: Not on file  . Highest education level: Not on file  Occupational History  . Not  on file  Tobacco Use  . Smoking status: Former Smoker    Packs/day: 1.00    Years: 25.00    Pack years: 25.00    Types: Cigarettes    Quit date: 11/2019    Years since quitting: 0.3  . Smokeless tobacco: Never Used  Vaping Use  . Vaping Use: Never used  Substance and Sexual Activity  . Alcohol use: Yes    Alcohol/week: 12.0 standard drinks    Types: 12 Cans of beer per week    Comment: 10-18 beers /day  . Drug use: Not Currently    Types: Marijuana  . Sexual activity: Never    Birth control/protection: None  Other Topics Concern  . Not on file  Social History Narrative  . Not on file   Social Determinants of Health   Financial Resource Strain:   . Difficulty of Paying Living Expenses: Not on file  Food Insecurity:   . Worried About Programme researcher, broadcasting/film/video in the Last Year: Not on file  . Ran Out of Food in the Last Year: Not on file  Transportation Needs:   . Lack of Transportation (Medical): Not on file  . Lack of Transportation (Non-Medical): Not on file  Physical Activity:   . Days of Exercise per Week: Not on file  . Minutes of Exercise per Session: Not on file  Stress:   . Feeling of Stress : Not on file  Social Connections:   . Frequency of Communication with Friends and Family: Not on file  . Frequency of Social Gatherings with Friends and Family: Not on file  . Attends Religious Services: Not on file  . Active Member of Clubs or Organizations: Not on file  . Attends Banker Meetings: Not on file  . Marital Status: Not on file  Intimate Partner Violence:   . Fear of Current or Ex-Partner: Not on file  . Emotionally Abused: Not on file  . Physically Abused: Not on file  . Sexually Abused: Not on file    Family History: No family history on file.  Medications:   Current Outpatient Medications on File Prior to Visit  Medication Sig Dispense Refill  . amLODipine (NORVASC) 10 MG tablet Take 1 tablet (10 mg total) by mouth daily. 30 tablet 0  .  aspirin EC 81 MG tablet Take 81 mg by mouth daily. Swallow whole.    . cephALEXin (KEFLEX) 500 MG capsule Take 500 mg by mouth 2 (two) times daily.    Marland Kitchen LORazepam (ATIVAN) 1 MG tablet Take 1 tablet (1 mg total) by mouth every 6 (six) hours as needed for anxiety. 10 tablet 0  . Multiple Vitamin (MULTIVITAMIN WITH MINERALS) TABS tablet Take 1 tablet by mouth daily. 30 tablet 0  . pantoprazole sodium (PROTONIX) 40 mg/20 mL PACK Take 20 mLs (40 mg total) by mouth daily. 600 mL 0  . polyethylene glycol (MIRALAX / GLYCOLAX) 17 g packet Take 17 g by mouth daily as needed for moderate constipation. 14 each 0   No current facility-administered medications on file  prior to visit.    Allergies:  No Known Allergies    OBJECTIVE:  Physical Exam  Vitals:   04/06/20 1117  BP: 120/72  Pulse: 66  Weight: 175 lb 12.8 oz (79.7 kg)  Height: 5\' 11"  (1.803 m)   Body mass index is 24.52 kg/m. No exam data present  No flowsheet data found.   General: well developed, well nourished,  pleasant middle-age Caucasian male, seated, in no evident distress Head: head normocephalic and atraumatic.   Neck: supple with no carotid or supraclavicular bruits Cardiovascular: regular rate and rhythm, no murmurs Musculoskeletal: no deformity Skin:  no rash/petichiae Vascular:  Normal pulses all extremities   Neurologic Exam Mental Status: Awake and fully alert.   Mild dysarthria.  Oriented to place and time. Recent and remote memory intact. Attention span, concentration and fund of knowledge appropriate during visit.  Able to follow commands without difficulty.  Mood and affect appropriate.  Cranial Nerves: Fundoscopic exam reveals sharp disc margins. Pupils equal, briskly reactive to light. Extraocular movements full without nystagmus. Visual fields full to confrontation. Hearing intact. Facial sensation intact.  Mild left lower facial weakness.  Tongue and palate moves normally and symmetrically.  Motor: Normal  bulk and tone.  Full strength right upper and lower extremity LUE: 4/5 elbow extension and flexion and decreased hand grip with decreased dexterity LLE: 4/5 hip flexor weakness otherwise 5/5 Sensory.: intact to touch , pinprick , position and vibratory sensation.  Coordination: Rapid alternating movements normal in all extremities except decreased left hand. Finger-to-nose and heel-to-shin performed accurately on right side and LLE Gait and Station: Arises from chair without difficulty. Stance is normal. Gait demonstrates normal stride length with mild unsteadiness and use of Rollator walker Reflexes: 1+ and symmetric. Toes downgoing.     NIHSS  2 Modified Rankin  3      ASSESSMENT: LEVAN ALOIA is a 39 y.o. year old male presented with left-sided weakness and right gaze preference on 02/23/2020 with stroke work-up revealing right basal ganglia ICH likely secondary to HTN.  Hospital course complicated by hypertensive emergency on arrival, EtOH withdrawal with delirium tremors and acute alcohol hepatitis with transaminitis, tachypnea, hyponatremia, hypokalemia and hypomagnesemia.  Vascular risk factors include EtOH abuse, tobacco use, substance abuse and HTN.  He was discharged to SNF for ongoing therapy needs.     PLAN:  1. R BG ICH, hypertensive:  a. Residual deficit: Left hemiparesis, dysarthria and left lower facial weakness. i. Continue working with therapies at [facility] for likely ongoing improvement b. Placed on aspirin by facility for elevated platelet count.  Repeat CT head to evaluate for resolution of ICH especially with initiating aspirin by facility.  c. Close PCP follow up for aggressive stroke risk factor management  2. HTN: BP goal <130/90.  Stable today.  Managed by facility. 3. EtOH use: Denies continued symptoms of withdrawal or with cessation.  Discussed importance of refraining from EtOH when he returns to community setting.  He verbalized understanding.    Follow up  in 3 months or call earlier if needed   I spent 45 minutes of face-to-face and non-face-to-face time with patient.  This included previsit chart review, lab review, study review, order entry, electronic health record documentation, patient education regarding recent stroke and etiology, residual deficits, importance of managing stroke risk factors and answered all questions to patient satisfaction   [facility], [facility]  [facility] [address] [place], [place]  Phone [phone] Fax [phone]  Note: This document was prepared with digital dictation and possible smart phrase technology. Any transcriptional errors that result from this process are unintentional.

## 2020-04-12 ENCOUNTER — Ambulatory Visit
Admission: RE | Admit: 2020-04-12 | Discharge: 2020-04-12 | Disposition: A | Discharge status: Home / Self Care | Payer: Medicaid Other | Source: Ambulatory Visit | Attending: Adult Health | Admitting: Adult Health

## 2020-04-12 ENCOUNTER — Other Ambulatory Visit: Payer: Medicaid Other

## 2020-04-28 ENCOUNTER — Ambulatory Visit (INDEPENDENT_AMBULATORY_CARE_PROVIDER_SITE_OTHER): Payer: Self-pay | Admitting: Family Medicine

## 2020-04-28 ENCOUNTER — Encounter: Payer: Self-pay | Admitting: Family Medicine

## 2020-04-28 ENCOUNTER — Other Ambulatory Visit: Payer: Self-pay

## 2020-04-28 VITALS — BP 105/68 | HR 88 | Temp 98.1°F | Ht 71.0 in | Wt 181.1 lb

## 2020-04-28 DIAGNOSIS — I10 Essential (primary) hypertension: Secondary | ICD-10-CM

## 2020-04-28 DIAGNOSIS — F1011 Alcohol abuse, in remission: Secondary | ICD-10-CM | POA: Insufficient documentation

## 2020-04-28 DIAGNOSIS — G629 Polyneuropathy, unspecified: Secondary | ICD-10-CM

## 2020-04-28 DIAGNOSIS — Z6825 Body mass index (BMI) 25.0-25.9, adult: Secondary | ICD-10-CM

## 2020-04-28 DIAGNOSIS — Z0000 Encounter for general adult medical examination without abnormal findings: Secondary | ICD-10-CM

## 2020-04-28 DIAGNOSIS — F419 Anxiety disorder, unspecified: Secondary | ICD-10-CM

## 2020-04-28 DIAGNOSIS — F32A Depression, unspecified: Secondary | ICD-10-CM | POA: Insufficient documentation

## 2020-04-28 DIAGNOSIS — F339 Major depressive disorder, recurrent, unspecified: Secondary | ICD-10-CM

## 2020-04-28 DIAGNOSIS — I610 Nontraumatic intracerebral hemorrhage in hemisphere, subcortical: Secondary | ICD-10-CM

## 2020-04-28 HISTORY — DX: Polyneuropathy, unspecified: G62.9

## 2020-04-28 HISTORY — DX: Alcohol abuse, in remission: F10.11

## 2020-04-28 HISTORY — DX: Anxiety disorder, unspecified: F41.9

## 2020-04-28 MED ORDER — HYDROXYZINE PAMOATE 25 MG PO CAPS: 25.0000 mg | ORAL_CAPSULE | Freq: Three times a day (TID) | ORAL | 2 refills | Status: AC | PRN

## 2020-04-28 MED ORDER — ATORVASTATIN CALCIUM 10 MG PO TABS: 10.0000 mg | ORAL_TABLET | Freq: Every day | ORAL | 3 refills | Status: DC

## 2020-04-28 NOTE — Progress Notes (Addendum)
New Patient Office Visit  Subjective:  Patient ID: Tommy Garcia, male    DOB: Sep 25, 1980  Age: 39 y.o. MRN: [id number]  CC:  Chief Complaint  Patient presents with  . New Patient (Initial Visit)    HPI Tommy Garcia presents for to establish care.   1. ICH He recently suffered from a hemorrhagic stroke in July of this year. He was in a SNF until 2 weeks ago. He is now living at home with his father. He has some residual effects from the stroke: issues with dexterity in his right hand and neuropathy in his bilateral feet and right hand. He was in PT at the SNF. He reports he continues to do PT exercises at home. He is seeing neurology and had an appointment 2 weeks ago.  2. Alcohol abuse He has a history of alcohol abuse. He has been sober since his stroke. He currently goes to [employer] 3-4x a week.    3. Anxiety He reports feeling anxious and having difficulty sleeping because of racing thoughts. He has tried melatonin without relief. He was previously taking Ativan which helped with these symptoms.   4. Depression He reports little interest in doing things and difficulty sleeping. He denies SI or HI. He does not currently take anything for depression.   5. Hypertension He reports his diet is poor and that he consumes a lot of sweets. He does not exercise. He currently takes amlodipine and aspirin daily. He does not check his BP at home. He denies shortness of breath, chest pain, blurred vision, edema, frequent headaches, or palpitations.  Past Medical History:  Diagnosis Date  . Alcohol abuse   . Depression   . GERD (gastroesophageal reflux disease)   . Hypertension   . Stroke [facility])     Past Surgical History:  Procedure Laterality Date  . HAND TENDON SURGERY Right   . OPEN REDUCTION INTERNAL FIXATION (ORIF) DISTAL RADIAL FRACTURE Left 12/26/2017   Procedure: OPEN REDUCTION INTERNAL FIXATION (ORIF) DISTAL RADIAL FRACTURE,;  Surgeon: Leanora Cover, MD;  Location: [facility];  Service: Orthopedics;  Laterality: Left;  . WRIST SURGERY Left around 1995    Family History  Problem Relation Age of Onset  . Anxiety disorder Mother   . Depression Mother   . Arthritis Father   . Stroke Paternal Grandmother   . Cancer Paternal Grandfather        Prostate    Social History   Socioeconomic History  . Marital status: Divorced    Spouse name: Not on file  . Number of children: Not on file  . Years of education: 32  . Highest education level: High school graduate  Occupational History  . Not on file  Tobacco Use  . Smoking status: Former Smoker    Packs/day: 1.00    Years: 25.00    Pack years: 25.00    Types: Cigarettes    Quit date: 11/2019    Years since quitting: 0.4  . Smokeless tobacco: Never Used  Vaping Use  . Vaping Use: Never used  Substance and Sexual Activity  . Alcohol use: Not Currently    Alcohol/week: 12.0 standard drinks    Types: 12 Cans of beer per week    Comment: hasn't had any alcohol since July 2021 ---10-18 beers /day  . Drug use: Not Currently    Types: Marijuana  . Sexual activity: Not Currently  Other Topics Concern  . Not on file  Social History  Narrative  . Not on file   Social Determinants of Health   Financial Resource Strain:   . Difficulty of Paying Living Expenses: Not on file  Food Insecurity:   . Worried About Charity fundraiser in the Last Year: Not on file  . Ran Out of Food in the Last Year: Not on file  Transportation Needs:   . Lack of Transportation (Medical): Not on file  . Lack of Transportation (Non-Medical): Not on file  Physical Activity:   . Days of Exercise per Week: Not on file  . Minutes of Exercise per Session: Not on file  Stress:   . Feeling of Stress : Not on file  Social Connections:   . Frequency of Communication with Friends and Family: Not on file  . Frequency of Social Gatherings with Friends and Family: Not on file  . Attends Religious Services: Not on  file  . Active Member of Clubs or Organizations: Not on file  . Attends Archivist Meetings: Not on file  . Marital Status: Not on file  Intimate Partner Violence:   . Fear of Current or Ex-Partner: Not on file  . Emotionally Abused: Not on file  . Physically Abused: Not on file  . Sexually Abused: Not on file    Review of Systems  Per HPI  Objective:   Today's Vitals: BP 105/68   Pulse 88   Temp 98.1 F (36.7 C) (Temporal)   Ht _0  (1.803 m)   Wt 181 lb 2 oz (82.2 kg)   BMI 25.26 kg/m     Office Visit from 04/28/2020 in [facility]  PHQ-9 Total Score 6     GAD 7 : Generalized Anxiety Score 04/28/2020  Nervous, Anxious, on Edge 3  Control/stop worrying 3  Worry too much - different things 3  Trouble relaxing 3  Restless 3  Easily annoyed or irritable 2  Afraid - awful might happen 1  Total GAD 7 Score 18    Physical Exam Vitals and nursing note reviewed.  Constitutional:      General: He is not in acute distress.    Appearance: Normal appearance. He is normal weight. He is not ill-appearing or diaphoretic.  HENT:     Head: Normocephalic and atraumatic.     Right Ear: Tympanic membrane, ear canal and external ear normal.     Left Ear: Tympanic membrane, ear canal and external ear normal.     Nose: Nose normal.     Mouth/Throat:     Mouth: Mucous membranes are moist.     Pharynx: Oropharynx is clear.  Eyes:     Extraocular Movements: Extraocular movements intact.     Pupils: Pupils are equal, round, and reactive to light.  Neck:     Vascular: No carotid bruit.  Cardiovascular:     Rate and Rhythm: Normal rate and regular rhythm.     Heart sounds: Normal heart sounds. No murmur heard.   Pulmonary:     Effort: Pulmonary effort is normal.     Breath sounds: Normal breath sounds.  Abdominal:     General: Bowel sounds are normal. There is no distension.     Palpations: Abdomen is soft. There is no mass.     Tenderness:  There is no abdominal tenderness. There is no guarding or rebound.     Hernia: No hernia is present.  Musculoskeletal:        General: Normal range of motion.  Cervical back: Normal range of motion and neck supple. No rigidity or tenderness.     Right lower leg: No edema.     Left lower leg: No edema.     Comments: Full strength and ROM in hand bilaterally  Lymphadenopathy:     Cervical: No cervical adenopathy.  Skin:    General: Skin is warm and dry.     Capillary Refill: Capillary refill takes less than 2 seconds.  Neurological:     General: No focal deficit present.     Mental Status: He is alert and oriented to person, place, and time.     Motor: No weakness.     Gait: Gait normal.  Psychiatric:        Attention and Perception: Attention normal.        Mood and Affect: Affect is flat.        Speech: Speech normal.        Behavior: Behavior is cooperative.        Cognition and Memory: Cognition normal.    Assessment & Plan:  Balen was seen today for new patient (initial visit).  Diagnoses and all orders for this visit:  Nontraumatic subcortical hemorrhage of right cerebral hemisphere [facility]) Continue follow up with Neurology as scheduled. Will start low dose statin now for prevention. Patient will return for fasting labs by early next week, will adjust statin therapy as needed. BP well controlled. Discussed referral for PT for dexterity issues, patient declined at this time.  -     CBC with Differential/Platelet; Future -     CMP14+EGFR; Future -     Lipid panel; Future -     atorvastatin (LIPITOR) 10 MG tablet; Take 1 tablet (10 mg total) by mouth daily.  Essential hypertension Continue current therapy. Start statin therapy. Labs ordered as below. BP well controlled. Dash diet, exercise, low salt diet.  -     CBC with Differential/Platelet; Future -     CMP14+EGFR; Future -     Lipid panel; Future -     Thyroid Panel With TSH; Future -     atorvastatin (LIPITOR) 10 MG  tablet; Take 1 tablet (10 mg total) by mouth daily.  Neuropathy Labs pending as below. Did not prescribe gabapentin due to recent hx of alcohol abuse. Discussed this is likely a residual symptoms of stroke. Avoid alcohol.  -     Thyroid Panel With TSH; Future -     Vitamin B12; Future  History of alcohol abuse Continue attending [facility] meetings. Praised for sobriety. Patient agrees to notify provider if he needs further rehabilitation.  -     Vitamin B12; Future  Anxiety Patient declined antidepressant today. Discussed avoiding benzos with hx of alcohol abuse. Will try vistaril for anxiety.  -     hydrOXYzine (VISTARIL) 25 MG capsule; Take 1 capsule (25 mg total) by mouth 3 (three) times daily as needed.  Recurrent depression ([place]) Patient declined antidepressant today. Will notify provider if this changes. No SI or HI today.  -     Vitamin B12; Future -     VITAMIN D 25 Hydroxy (Vit-D Deficiency, Fractures); Future  BMI 25.0-25.9,adult Dash diet and exercise.  -     CMP14+EGFR; Future -     Lipid panel; Future -     VITAMIN D 25 Hydroxy (Vit-D Deficiency, Fractures); Future  Encounter for medical examination to establish care   Follow-up: 3 months for chronic follow up, sooner if needed.   The  patient indicates understanding of these issues and agrees with the plan.  Gwenlyn Perking, FNP

## 2020-04-28 NOTE — Patient Instructions (Signed)
Neuropathic Pain Neuropathic pain is pain caused by damage to the nerves that are responsible for certain sensations in your body (sensory nerves). The pain can be caused by:  Damage to the sensory nerves that send signals to your spinal cord and brain (peripheral nervous system).  Damage to the sensory nerves in your brain or spinal cord (central nervous system). Neuropathic pain can make you more sensitive to pain. Even a minor sensation can feel very painful. This is usually a long-term condition that can be difficult to treat. The type of pain differs from person to person. It may:  Start suddenly (acute), or it may develop slowly and last for a long time (chronic).  Come and go as damaged nerves heal, or it may stay at the same level for years.  Cause emotional distress, loss of sleep, and a lower quality of life. What are the causes? The most common cause of this condition is diabetes. Many other diseases and conditions can also cause neuropathic pain. Causes of neuropathic pain can be classified as:  Toxic. This is caused by medicines and chemicals. The most common cause of toxic neuropathic pain is damage from cancer treatments (chemotherapy).  Metabolic. This can be caused by: ? Diabetes. This is the most common disease that damages the nerves. ? Lack of vitamin B from long-term alcohol abuse.  Traumatic. Any injury that cuts, crushes, or stretches a nerve can cause damage and pain. A common example is feeling pain after losing an arm or leg (phantom limb pain).  Compression-related. If a sensory nerve gets trapped or compressed for a long period of time, the blood supply to the nerve can be cut off.  Vascular. Many blood vessel diseases can cause neuropathic pain by decreasing blood supply and oxygen to nerves.  Autoimmune. This type of pain results from diseases in which the body's defense system (immune system) mistakenly attacks sensory nerves. Examples of autoimmune diseases  that can cause neuropathic pain include lupus and multiple sclerosis.  Infectious. Many types of viral infections can damage sensory nerves and cause pain. Shingles infection is a common cause of this type of pain.  Inherited. Neuropathic pain can be a symptom of many diseases that are passed down through families (genetic). What increases the risk? You are more likely to develop this condition if:  You have diabetes.  You smoke.  You drink too much alcohol.  You are taking certain medicines, including medicines that kill cancer cells (chemotherapy) or that treat immune system disorders. What are the signs or symptoms? The main symptom is pain. Neuropathic pain is often described as:  Burning.  Shock-like.  Stinging.  Hot or cold.  Itching. How is this diagnosed? No single test can diagnose neuropathic pain. It is diagnosed based on:  Physical exam and your symptoms. Your health care provider will ask you about your pain. You may be asked to use a pain scale to describe how bad your pain is.  Tests. These may be done to see if you have a high sensitivity to pain and to help find the cause and location of any sensory nerve damage. They include: ? Nerve conduction studies to test how well nerve signals travel through your sensory nerves (electrodiagnostic testing). ? Stimulating your sensory nerves through electrodes on your skin and measuring the response in your spinal cord and brain (somatosensory evoked potential).  Imaging studies, such as: ? X-rays. ? CT scan. ? MRI. How is this treated? Treatment for neuropathic pain may change   over time. You may need to try different treatment options or a combination of treatments. Some options include:  Treating the underlying cause of the neuropathy, such as diabetes, kidney disease, or vitamin deficiencies.  Stopping medicines that can cause neuropathy, such as chemotherapy.  Medicine to relieve pain. Medicines may  include: ? Prescription or over-the-counter pain medicine. ? Anti-seizure medicine. ? Antidepressant medicines. ? Pain-relieving patches that are applied to painful areas of skin. ? A medicine to numb the area (local anesthetic), which can be injected as a nerve block.  Transcutaneous nerve stimulation. This uses electrical currents to block painful nerve signals. The treatment is painless.  Alternative treatments, such as: ? Acupuncture. ? Meditation. ? Massage. ? Physical therapy. ? Pain management programs. ? Counseling. Follow these instructions at home: Medicines   Take over-the-counter and prescription medicines only as told by your health care provider.  Do not drive or use heavy machinery while taking prescription pain medicine.  If you are taking prescription pain medicine, take actions to prevent or treat constipation. Your health care provider may recommend that you: ? Drink enough fluid to keep your urine pale yellow. ? Eat foods that are high in fiber, such as fresh fruits and vegetables, whole grains, and beans. ? Limit foods that are high in fat and processed sugars, such as fried or sweet foods. ? Take an over-the-counter or prescription medicine for constipation. Lifestyle   Have a good support system at home.  Consider joining a chronic pain support group.  Do not use any products that contain nicotine or tobacco, such as cigarettes and e-cigarettes. If you need help quitting, ask your health care provider.  Do not drink alcohol. General instructions  Learn as much as you can about your condition.  Work closely with all your health care providers to find the treatment plan that works best for you.  Ask your health care provider what activities are safe for you.  Keep all follow-up visits as told by your health care provider. This is important. Contact a health care provider if:  Your pain treatments are not working.  You are having side effects  from your medicines.  You are struggling with tiredness (fatigue), mood changes, depression, or anxiety. Summary  Neuropathic pain is pain caused by damage to the nerves that are responsible for certain sensations in your body (sensory nerves).  Neuropathic pain may come and go as damaged nerves heal, or it may stay at the same level for years.  Neuropathic pain is usually a long-term condition that can be difficult to treat. Consider joining a chronic pain support group. This information is not intended to replace advice given to you by your health care provider. Make sure you discuss any questions you have with your health care provider. Document Revised: 11/06/2018 Document Reviewed: 08/02/2017 Elsevier Patient Education  2020 [employer] DASH Eating Plan DASH stands for "Dietary Approaches to Stop Hypertension." The DASH eating plan is a healthy eating plan that has been shown to reduce high blood pressure (hypertension). It may also reduce your risk for type 2 diabetes, heart disease, and stroke. The DASH eating plan may also help with weight loss. What are tips for following this plan?  General guidelines  Avoid eating more than 2,300 mg (milligrams) of salt (sodium) a day. If you have hypertension, you may need to reduce your sodium intake to 1,500 mg a day.  Limit alcohol intake to no more than 1 drink a day for nonpregnant women and  2 drinks a day for men. One drink equals 12 oz of beer, 5 oz of wine, or 1 oz of hard liquor.  Work with your health care provider to maintain a healthy body weight or to lose weight. Ask what an ideal weight is for you.  Get at least 30 minutes of exercise that causes your heart to beat faster (aerobic exercise) most days of the week. Activities may include walking, swimming, or biking.  Work with your health care provider or diet and nutrition specialist (dietitian) to adjust your eating plan to your individual calorie needs. Reading food  labels   Check food labels for the amount of sodium per serving. Choose foods with less than 5 percent of the Daily Value of sodium. Generally, foods with less than 300 mg of sodium per serving fit into this eating plan.  To find whole grains, look for the word "whole" as the first word in the ingredient list. Shopping  Buy products labeled as "low-sodium" or "no salt added."  Buy fresh foods. Avoid canned foods and premade or frozen meals. Cooking  Avoid adding salt when cooking. Use salt-free seasonings or herbs instead of table salt or sea salt. Check with your health care provider or pharmacist before using salt substitutes.  Do not fry foods. Cook foods using healthy methods such as baking, boiling, grilling, and broiling instead.  Cook with heart-healthy oils, such as olive, canola, soybean, or sunflower oil. Meal planning  Eat a balanced diet that includes: ? 5 or more servings of fruits and vegetables each day. At each meal, try to fill half of your plate with fruits and vegetables. ? Up to 6-8 servings of whole grains each day. ? Less than 6 oz of lean meat, poultry, or fish each day. A 3-oz serving of meat is about the same size as a deck of cards. One egg equals 1 oz. ? 2 servings of low-fat dairy each day. ? A serving of nuts, seeds, or beans 5 times each week. ? Heart-healthy fats. Healthy fats called Omega-3 fatty acids are found in foods such as flaxseeds and coldwater fish, like sardines, salmon, and mackerel.  Limit how much you eat of the following: ? Canned or prepackaged foods. ? Food that is high in trans fat, such as fried foods. ? Food that is high in saturated fat, such as fatty meat. ? Sweets, desserts, sugary drinks, and other foods with added sugar. ? Full-fat dairy products.  Do not salt foods before eating.  Try to eat at least 2 vegetarian meals each week.  Eat more home-cooked food and less restaurant, buffet, and fast food.  When eating at a  restaurant, ask that your food be prepared with less salt or no salt, if possible. What foods are recommended? The items listed may not be a complete list. Talk with your dietitian about what dietary choices are best for you. Grains Whole-grain or whole-wheat bread. Whole-grain or whole-wheat pasta. Brown rice. Orpah Cobb. Bulgur. Whole-grain and low-sodium cereals. Pita bread. Low-fat, low-sodium crackers. Whole-wheat flour tortillas. Vegetables Fresh or frozen vegetables (raw, steamed, roasted, or grilled). Low-sodium or reduced-sodium tomato and vegetable juice. Low-sodium or reduced-sodium tomato sauce and tomato paste. Low-sodium or reduced-sodium canned vegetables. Fruits All fresh, dried, or frozen fruit. Canned fruit in natural juice (without added sugar). Meat and other protein foods Skinless chicken or [place]. Ground chicken or [place]. Pork with fat trimmed off. Fish and seafood. Egg whites. Dried beans, peas, or lentils. Unsalted nuts, nut  butters, and seeds. Unsalted canned beans. Lean cuts of beef with fat trimmed off. Low-sodium, lean deli meat. Dairy Low-fat (1%) or fat-free (skim) milk. Fat-free, low-fat, or reduced-fat cheeses. Nonfat, low-sodium ricotta or cottage cheese. Low-fat or nonfat yogurt. Low-fat, low-sodium cheese. Fats and oils Soft margarine without trans fats. Vegetable oil. Low-fat, reduced-fat, or light mayonnaise and salad dressings (reduced-sodium). Canola, safflower, olive, soybean, and sunflower oils. Avocado. Seasoning and other foods Herbs. Spices. Seasoning mixes without salt. Unsalted popcorn and pretzels. Fat-free sweets. What foods are not recommended? The items listed may not be a complete list. Talk with your dietitian about what dietary choices are best for you. Grains Baked goods made with fat, such as croissants, muffins, or some breads. Dry pasta or rice meal packs. Vegetables Creamed or fried vegetables. Vegetables in a cheese sauce.  Regular canned vegetables (not low-sodium or reduced-sodium). Regular canned tomato sauce and paste (not low-sodium or reduced-sodium). Regular tomato and vegetable juice (not low-sodium or reduced-sodium). Rosita Fire. Olives. Fruits Canned fruit in a light or heavy syrup. Fried fruit. Fruit in cream or butter sauce. Meat and other protein foods Fatty cuts of meat. Ribs. Fried meat. Tomasa Blase. Sausage. Bologna and other processed lunch meats. Salami. Fatback. Hotdogs. Bratwurst. Salted nuts and seeds. Canned beans with added salt. Canned or smoked fish. Whole eggs or egg yolks. Chicken or [place] with skin. Dairy Whole or 2% milk, cream, and half-and-half. Whole or full-fat cream cheese. Whole-fat or sweetened yogurt. Full-fat cheese. Nondairy creamers. Whipped toppings. Processed cheese and cheese spreads. Fats and oils Butter. Stick margarine. Lard. Shortening. Ghee. Bacon fat. Tropical oils, such as coconut, palm kernel, or palm oil. Seasoning and other foods Salted popcorn and pretzels. Onion salt, garlic salt, seasoned salt, table salt, and sea salt. Worcestershire sauce. Tartar sauce. Barbecue sauce. Teriyaki sauce. Soy sauce, including reduced-sodium. Steak sauce. Canned and packaged gravies. Fish sauce. Oyster sauce. Cocktail sauce. Horseradish that you find on the shelf. Ketchup. Mustard. Meat flavorings and tenderizers. Bouillon cubes. Hot sauce and Tabasco sauce. Premade or packaged marinades. Premade or packaged taco seasonings. Relishes. Regular salad dressings. Where to find more information:  National Heart, Lung, and Blood Institute: PopSteam.is  American Heart Association: www.heart.org Summary  The DASH eating plan is a healthy eating plan that has been shown to reduce high blood pressure (hypertension). It may also reduce your risk for type 2 diabetes, heart disease, and stroke.  With the DASH eating plan, you should limit salt (sodium) intake to 2,300 mg a day. If you have  hypertension, you may need to reduce your sodium intake to 1,500 mg a day.  When on the DASH eating plan, aim to eat more fresh fruits and vegetables, whole grains, lean proteins, low-fat dairy, and heart-healthy fats.  Work with your health care provider or diet and nutrition specialist (dietitian) to adjust your eating plan to your individual calorie needs. This information is not intended to replace advice given to you by your health care provider. Make sure you discuss any questions you have with your health care provider. Document Revised: 06/28/2017 Document Reviewed: 07/09/2016 Elsevier Patient Education  2020 [employer].

## 2020-05-06 ENCOUNTER — Telehealth: Payer: Self-pay

## 2020-05-06 MED ORDER — AMLODIPINE BESYLATE 10 MG PO TABS: 10.0000 mg | ORAL_TABLET | Freq: Every day | ORAL | 2 refills | Status: DC

## 2020-05-06 NOTE — Telephone Encounter (Signed)
Medication sent in, tried to contact patient to inform, no answer, mailbox full

## 2020-05-06 NOTE — Telephone Encounter (Signed)
  Prescription Request  05/06/2020  What is the name of the medication or equipment? Amlodipine  Have you contacted your pharmacy to request a refill? (if applicable) Yes  Which pharmacy would you like this sent to? [facility], [place]  Pt is out of his BP medicine (amlodipine) and needs refills sent to pharmacy. Pt was seen by Tiffany on 04/28/20 and pt thought refills would be sent in for him but none were.  Please call pt to let him know if refills are sent in for him.   Patient notified that their request is being sent to the clinical staff for review and that they should receive a response within 2 business days.

## 2020-05-06 NOTE — Telephone Encounter (Signed)
Pt returned missed call from nurse regarding Rx request. Made pt aware that Amlodipine Rx was sent to [facility] in [place] for him. Pt aware.

## 2020-05-09 DIAGNOSIS — Z0271 Encounter for disability determination: Secondary | ICD-10-CM

## 2020-08-01 ENCOUNTER — Encounter: Payer: Self-pay | Admitting: Family Medicine

## 2020-08-01 ENCOUNTER — Ambulatory Visit (INDEPENDENT_AMBULATORY_CARE_PROVIDER_SITE_OTHER): Payer: Self-pay | Admitting: Family Medicine

## 2020-08-01 ENCOUNTER — Other Ambulatory Visit: Payer: Self-pay

## 2020-08-01 VITALS — BP 91/62 | HR 97 | Temp 98.3°F | Resp 20 | Ht 71.0 in | Wt 190.0 lb

## 2020-08-01 DIAGNOSIS — I10 Essential (primary) hypertension: Secondary | ICD-10-CM

## 2020-08-01 DIAGNOSIS — F419 Anxiety disorder, unspecified: Secondary | ICD-10-CM

## 2020-08-01 DIAGNOSIS — F1011 Alcohol abuse, in remission: Secondary | ICD-10-CM

## 2020-08-01 DIAGNOSIS — I610 Nontraumatic intracerebral hemorrhage in hemisphere, subcortical: Secondary | ICD-10-CM

## 2020-08-01 DIAGNOSIS — L0291 Cutaneous abscess, unspecified: Secondary | ICD-10-CM

## 2020-08-01 DIAGNOSIS — G629 Polyneuropathy, unspecified: Secondary | ICD-10-CM

## 2020-08-01 MED ORDER — AMLODIPINE BESYLATE 10 MG PO TABS: 10.0000 mg | ORAL_TABLET | Freq: Every day | ORAL | 2 refills | Status: DC

## 2020-08-01 MED ORDER — CEPHALEXIN 500 MG PO CAPS: 500.0000 mg | ORAL_CAPSULE | Freq: Two times a day (BID) | ORAL | 0 refills | Status: AC

## 2020-08-01 NOTE — Patient Instructions (Addendum)
Hypertension, Adult High blood pressure (hypertension) is when the force of blood pumping through the arteries is too strong. The arteries are the blood vessels that carry blood from the heart throughout the body. Hypertension forces the heart to work harder to pump blood and may cause arteries to become narrow or stiff. Untreated or uncontrolled hypertension can cause a heart attack, heart failure, a stroke, kidney disease, and other problems. A blood pressure reading consists of a higher number over a lower number. Ideally, your blood pressure should be below 120/80. The first ("top") number is called the systolic pressure. It is a measure of the pressure in your arteries as your heart beats. The second ("bottom") number is called the diastolic pressure. It is a measure of the pressure in your arteries as the heart relaxes. What are the causes? The exact cause of this condition is not known. There are some conditions that result in or are related to high blood pressure. What increases the risk? Some risk factors for high blood pressure are under your control. The following factors may make you more likely to develop this condition:  Smoking.  Having type 2 diabetes mellitus, high cholesterol, or both.  Not getting enough exercise or physical activity.  Being overweight.  Having too much fat, sugar, calories, or salt (sodium) in your diet.  Drinking too much alcohol. Some risk factors for high blood pressure may be difficult or impossible to change. Some of these factors include:  Having chronic kidney disease.  Having a family history of high blood pressure.  Age. Risk increases with age.  Race. You may be at higher risk if you are African American.  Gender. Men are at higher risk than women before age 45. After age 65, women are at higher risk than men.  Having obstructive sleep apnea.  Stress. What are the signs or symptoms? High blood pressure may not cause symptoms. Very high  blood pressure (hypertensive crisis) may cause:  Headache.  Anxiety.  Shortness of breath.  Nosebleed.  Nausea and vomiting.  Vision changes.  Severe chest pain.  Seizures. How is this diagnosed? This condition is diagnosed by measuring your blood pressure while you are seated, with your arm resting on a flat surface, your legs uncrossed, and your feet flat on the floor. The cuff of the blood pressure monitor will be placed directly against the skin of your upper arm at the level of your heart. It should be measured at least twice using the same arm. Certain conditions can cause a difference in blood pressure between your right and left arms. Certain factors can cause blood pressure readings to be lower or higher than normal for a short period of time:  When your blood pressure is higher when you are in a health care provider's office than when you are at home, this is called white coat hypertension. Most people with this condition do not need medicines.  When your blood pressure is higher at home than when you are in a health care provider's office, this is called masked hypertension. Most people with this condition may need medicines to control blood pressure. If you have a high blood pressure reading during one visit or you have normal blood pressure with other risk factors, you may be asked to:  Return on a different day to have your blood pressure checked again.  Monitor your blood pressure at home for 1 week or longer. If you are diagnosed with hypertension, you may have other blood or   imaging tests to help your health care provider understand your overall risk for other conditions. How is this treated? This condition is treated by making healthy lifestyle changes, such as eating healthy foods, exercising more, and reducing your alcohol intake. Your health care provider may prescribe medicine if lifestyle changes are not enough to get your blood pressure under control, and  if:  Your systolic blood pressure is above 130.  Your diastolic blood pressure is above 80. Your personal target blood pressure may vary depending on your medical conditions, your age, and other factors. Follow these instructions at home: Eating and drinking   Eat a diet that is high in fiber and potassium, and low in sodium, added sugar, and fat. An example eating plan is called the DASH (Dietary Approaches to Stop Hypertension) diet. To eat this way: ? Eat plenty of fresh fruits and vegetables. Try to fill one half of your plate at each meal with fruits and vegetables. ? Eat whole grains, such as whole-wheat pasta, brown rice, or whole-grain bread. Fill about one fourth of your plate with whole grains. ? Eat or drink low-fat dairy products, such as skim milk or low-fat yogurt. ? Avoid fatty cuts of meat, processed or cured meats, and poultry with skin. Fill about one fourth of your plate with lean proteins, such as fish, chicken without skin, beans, eggs, or tofu. ? Avoid pre-made and processed foods. These tend to be higher in sodium, added sugar, and fat.  Reduce your daily sodium intake. Most people with hypertension should eat less than 1,500 mg of sodium a day.  Do not drink alcohol if: ? Your health care provider tells you not to drink. ? You are pregnant, may be pregnant, or are planning to become pregnant.  If you drink alcohol: ? Limit how much you use to:  0-1 drink a day for women.  0-2 drinks a day for men. ? Be aware of how much alcohol is in your drink. In the [place] one drink equals one 12 oz bottle of beer (355 mL), one 5 oz glass of wine (148 mL), or one 1 oz glass of hard liquor (44 mL). Lifestyle   Work with your health care provider to maintain a healthy body weight or to lose weight. Ask what an ideal weight is for you.  Get at least 30 minutes of exercise most days of the week. Activities may include walking, swimming, or biking.  Include exercise to  strengthen your muscles (resistance exercise), such as Pilates or lifting weights, as part of your weekly exercise routine. Try to do these types of exercises for 30 minutes at least 3 days a week.  Do not use any products that contain nicotine or tobacco, such as cigarettes, e-cigarettes, and chewing tobacco. If you need help quitting, ask your health care provider.  Monitor your blood pressure at home as told by your health care provider.  Keep all follow-up visits as told by your health care provider. This is important. Medicines  Take over-the-counter and prescription medicines only as told by your health care provider. Follow directions carefully. Blood pressure medicines must be taken as prescribed.  Do not skip doses of blood pressure medicine. Doing this puts you at risk for problems and can make the medicine less effective.  Ask your health care provider about side effects or reactions to medicines that you should watch for. Contact a health care provider if you:  Think you are having a reaction to a medicine you   are taking.  Have headaches that keep coming back (recurring).  Feel dizzy.  Have swelling in your ankles.  Have trouble with your vision. Get help right away if you:  Develop a severe headache or confusion.  Have unusual weakness or numbness.  Feel faint.  Have severe pain in your chest or abdomen.  Vomit repeatedly.  Have trouble breathing. Summary  Hypertension is when the force of blood pumping through your arteries is too strong. If this condition is not controlled, it may put you at risk for serious complications.  Your personal target blood pressure may vary depending on your medical conditions, your age, and other factors. For most people, a normal blood pressure is less than 120/80.  Hypertension is treated with lifestyle changes, medicines, or a combination of both. Lifestyle changes include losing weight, eating a healthy, low-sodium diet,  exercising more, and limiting alcohol. This information is not intended to replace advice given to you by your health care provider. Make sure you discuss any questions you have with your health care provider. Document Revised: 03/26/2018 Document Reviewed: 03/26/2018 Elsevier Patient Education  2020 [employer] Skin Abscess  A skin abscess is an infected area on or under your skin that contains a collection of pus and other material. An abscess may also be called a furuncle, carbuncle, or boil. An abscess can occur in or on almost any part of your body. Some abscesses break open (rupture) on their own. Most continue to get worse unless they are treated. The infection can spread deeper into the body and eventually into your blood, which can make you feel ill. Treatment usually involves draining the abscess. What are the causes? An abscess occurs when germs, like bacteria, pass through your skin and cause an infection. This may be caused by:  A scrape or cut on your skin.  A puncture wound through your skin, including a needle injection or insect bite.  Blocked oil or sweat glands.  Blocked and infected hair follicles.  A cyst that forms beneath your skin (sebaceous cyst) and becomes infected. What increases the risk? This condition is more likely to develop in people who:  Have a weak body defense system (immune system).  Have diabetes.  Have dry and irritated skin.  Get frequent injections or use illegal IV drugs.  Have a foreign body in a wound, such as a splinter.  Have problems with their lymph system or veins. What are the signs or symptoms? Symptoms of this condition include:  A painful, firm bump under the skin.  A bump with pus at the top. This may break through the skin and drain. Other symptoms include:  Redness surrounding the abscess site.  Warmth.  Swelling of the lymph nodes (glands) near the abscess.  Tenderness.  A sore on the skin. How is this  diagnosed? This condition may be diagnosed based on:  A physical exam.  Your medical history.  A sample of pus. This may be used to find out what is causing the infection.  Blood tests.  Imaging tests, such as an ultrasound, CT scan, or MRI. How is this treated? A small abscess that drains on its own may not need treatment. Treatment for larger abscesses may include:  Moist heat or heat pack applied to the area several times a day.  A procedure to drain the abscess (incision and drainage).  Antibiotic medicines. For a severe abscess, you may first get antibiotics through an IV and then change to antibiotics by mouth.  Follow these instructions at home: Medicines   Take over-the-counter and prescription medicines only as told by your health care provider.  If you were prescribed an antibiotic medicine, take it as told by your health care provider. Do not stop taking the antibiotic even if you start to feel better. Abscess care   If you have an abscess that has not drained, apply heat to the affected area. Use the heat source that your health care provider recommends, such as a moist heat pack or a heating pad. ? Place a towel between your skin and the heat source. ? Leave the heat on for 20-30 minutes. ? Remove the heat if your skin turns bright red. This is especially important if you are unable to feel pain, heat, or cold. You may have a greater risk of getting burned.  Follow instructions from your health care provider about how to take care of your abscess. Make sure you: ? Cover the abscess with a bandage (dressing). ? Change your dressing or gauze as told by your health care provider. ? Wash your hands with soap and water before you change the dressing or gauze. If soap and water are not available, use hand sanitizer.  Check your abscess every day for signs of a worsening infection. Check for: ? More redness, swelling, or pain. ? More fluid or blood. ? Warmth. ? More  pus or a bad smell. General instructions  To avoid spreading the infection: ? Do not share personal care items, towels, or hot tubs with others. ? Avoid making skin contact with other people.  Keep all follow-up visits as told by your health care provider. This is important. Contact a health care provider if you have:  More redness, swelling, or pain around your abscess.  More fluid or blood coming from your abscess.  Warm skin around your abscess.  More pus or a bad smell coming from your abscess.  A fever.  Muscle aches.  Chills or a general ill feeling. Get help right away if you:  Have severe pain.  See red streaks on your skin spreading away from the abscess. Summary  A skin abscess is an infected area on or under your skin that contains a collection of pus and other material.  A small abscess that drains on its own may not need treatment.  Treatment for larger abscesses may include having a procedure to drain the abscess and taking an antibiotic. This information is not intended to replace advice given to you by your health care provider. Make sure you discuss any questions you have with your health care provider. Document Revised: 11/06/2018 Document Reviewed: 08/29/2017 Elsevier Patient Education  2020 [employer].

## 2020-08-01 NOTE — Progress Notes (Signed)
Patient ID: Tommy Garcia, male    DOB: 1981-07-16, 40 y.o.   MRN: [id number]  Chief Complaint:  Medical Management of Chronic Issues (3 MO )   HPI: Tommy Garcia is a 40 y.o. male presenting on 08/01/2020 for Medical Management of Chronic Issues (3 MO )   1. Abscess   2. Primary hypertension   3. Nontraumatic subcortical hemorrhage of right cerebral hemisphere ([place])   4. Neuropathy   5. Anxiety   6. History of alcohol abuse    1. Abscess Tommy Garcia reports an abscess on the right side of his chin for 2-3 weeks. The area is a little tender. It has gotten larger over the last couple weeks. The area popped yesterday and started draining bloody and yellow drainage. He denies fever, chills, or spreading redness. He has not tried any remedies.   2. HTN Complaint with meds - Yes Checking BP at home ranging: no Exercising Regularly - occasional Watching Salt intake - Yes Pertinent ROS:  Headache - No Chest pain - No Dyspnea - No Palpitations - No LE edema - No  3. Neuropathy Report improving neuropathy from previous stroke. He occasionally does home PT exercises and reports that this helps.  4. Anxiety Reports improving anxiety. He takes hydroxyzine occasionally.  GAD 7 : Generalized Anxiety Score 08/01/2020 04/28/2020  Nervous, Anxious, on Edge 1 3  Control/stop worrying 0 3  Worry too much - different things 1 3  Trouble relaxing 1 3  Restless 1 3  Easily annoyed or irritable 0 2  Afraid - awful might happen 0 1  Total GAD 7 Score 4 18    4. Alcohol abuse Reports that he continues to remain sober. Attending AA meetings 3-4x a week.   They report good compliance with medications and can restate their regimen by memory. No medication side effects  PMH: Smoking status noted    Review of Systems Negative unless specially indicated above in HPI.   Objective   BP 91/62   Pulse 97   Temp 98.3 F (36.8 C)   Resp 20   Ht '5\' 11"'  (1.803 m)   Wt 190 lb (86.2 kg)   SpO2 100%    BMI 26.50 kg/m   Physical Exam Vitals and nursing note reviewed.  Constitutional:      General: He is not in acute distress.    Appearance: He is not ill-appearing, toxic-appearing or diaphoretic.  HENT:     Head: Normocephalic and atraumatic.  Neck:     Vascular: No carotid bruit.  Cardiovascular:     Rate and Rhythm: Normal rate and regular rhythm.     Heart sounds: Normal heart sounds. No murmur heard.   Pulmonary:     Effort: Pulmonary effort is normal. No respiratory distress.     Breath sounds: Normal breath sounds.  Abdominal:     General: Bowel sounds are normal. There is no distension.     Palpations: Abdomen is soft.     Tenderness: There is no abdominal tenderness.  Musculoskeletal:     Cervical back: No tenderness.     Right lower leg: No edema.     Left lower leg: No edema.  Skin:    General: Skin is warm and dry.     Comments: 2 cm x 3 cm abscess on right lower cheek. No area of fluctuance present. Mild TTP. Bloody drainage present. No erythema.   Neurological:     General: No focal deficit present.  Mental Status: He is alert and oriented to person, place, and time.  Psychiatric:        Mood and Affect: Mood normal.        Behavior: Behavior normal.        Thought Content: Thought content normal.        Judgment: Judgment normal.       Assessment and Plan: Tommy Garcia was seen today for medical management of chronic issues.  Diagnoses and all orders for this visit:  Abscess Return to office for new or worsening symptoms, or if symptoms persist.  -     cephALEXin (KEFLEX) 500 MG capsule; Take 1 capsule (500 mg total) by mouth 2 (two) times daily for 10 days.  Primary hypertension Well controlled on current regimen. Labs pending as below.  -     amLODipine (NORVASC) 10 MG tablet; Take 1 tablet (10 mg total) by mouth daily. -     CMP14+EGFR -     CBC with Differential/Platelet -     Lipid panel -     TSH  Nontraumatic subcortical hemorrhage of  right cerebral hemisphere (HCC)/Neuropathy Improving. Followed by neurology.  Anxiety Improving. Continue hydroxyzine as needed.  History of alcohol abuse Reports sobriety. Attending [place] meetings.   Educational handout given for Hypertension  Follow up in 6 months, sooner if needed.  The above assessment and management plan was discussed with the patient. The patient verbalized understanding of and has agreed to the management plan. Patient is aware to call the clinic if symptoms persist or worsen. Patient is aware when to return to the clinic for a follow-up visit. Patient educated on when it is appropriate to go to the emergency department.   Marjorie Smolder, FNP-C [facility] [phone]

## 2020-08-02 LAB — CBC WITH DIFFERENTIAL/PLATELET
Basophils Absolute: 0.1 10*3/uL (ref 0.0–0.2)
Basos: 1 %
EOS (ABSOLUTE): 0.3 10*3/uL (ref 0.0–0.4)
Eos: 3 %
Hematocrit: 40.5 % (ref 37.5–51.0)
Hemoglobin: 14.1 g/dL (ref 13.0–17.7)
Immature Grans (Abs): 0 10*3/uL (ref 0.0–0.1)
Immature Granulocytes: 0 %
Lymphocytes Absolute: 3 10*3/uL (ref 0.7–3.1)
Lymphs: 32 %
MCH: 30.7 pg (ref 26.6–33.0)
MCHC: 34.8 g/dL (ref 31.5–35.7)
MCV: 88 fL (ref 79–97)
Monocytes Absolute: 0.7 10*3/uL (ref 0.1–0.9)
Monocytes: 7 %
Neutrophils Absolute: 5.4 10*3/uL (ref 1.4–7.0)
Neutrophils: 57 %
Platelets: 368 10*3/uL (ref 150–450)
RBC: 4.59 x10E6/uL (ref 4.14–5.80)
RDW: 11.8 % (ref 11.6–15.4)
WBC: 9.4 10*3/uL (ref 3.4–10.8)

## 2020-08-02 LAB — TSH: TSH: 0.997 u[IU]/mL (ref 0.450–4.500)

## 2020-08-02 LAB — CMP14+EGFR
ALT: 11 IU/L (ref 0–44)
AST: 14 IU/L (ref 0–40)
Albumin/Globulin Ratio: 1.5 (ref 1.2–2.2)
Albumin: 4.4 g/dL (ref 4.0–5.0)
Alkaline Phosphatase: 118 IU/L (ref 44–121)
BUN/Creatinine Ratio: 16 (ref 9–20)
BUN: 14 mg/dL (ref 6–24)
Bilirubin Total: 0.6 mg/dL (ref 0.0–1.2)
CO2: 23 mmol/L (ref 20–29)
Calcium: 9.6 mg/dL (ref 8.7–10.2)
Chloride: 104 mmol/L (ref 96–106)
Creatinine, Ser: 0.88 mg/dL (ref 0.76–1.27)
GFR calc Af Amer: 123 mL/min/{1.73_m2} (ref >59)
GFR calc non Af Amer: 107 mL/min/{1.73_m2} (ref >59)
Globulin, Total: 2.9 g/dL (ref 1.5–4.5)
Glucose: 81 mg/dL (ref 65–99)
Potassium: 4.1 mmol/L (ref 3.5–5.2)
Sodium: 139 mmol/L (ref 134–144)
Total Protein: 7.3 g/dL (ref 6.0–8.5)

## 2020-08-02 LAB — LIPID PANEL
Chol/HDL Ratio: 4.3 ratio (ref 0.0–5.0)
Cholesterol, Total: 115 mg/dL (ref 100–199)
HDL: 27 mg/dL — ABNORMAL LOW (ref >39)
LDL Chol Calc (NIH): 64 mg/dL (ref 0–99)
Triglycerides: 131 mg/dL (ref 0–149)
VLDL Cholesterol Cal: 24 mg/dL (ref 5–40)

## 2020-08-08 ENCOUNTER — Encounter: Payer: Self-pay | Admitting: Adult Health

## 2020-08-08 ENCOUNTER — Ambulatory Visit: Payer: Self-pay | Admitting: Adult Health

## 2020-08-08 ENCOUNTER — Other Ambulatory Visit: Payer: Self-pay

## 2020-08-08 VITALS — BP 121/84 | HR 58 | Ht 71.0 in | Wt 191.6 lb

## 2020-08-08 DIAGNOSIS — I10 Essential (primary) hypertension: Secondary | ICD-10-CM

## 2020-08-08 DIAGNOSIS — E785 Hyperlipidemia, unspecified: Secondary | ICD-10-CM

## 2020-08-08 DIAGNOSIS — I610 Nontraumatic intracerebral hemorrhage in hemisphere, subcortical: Secondary | ICD-10-CM

## 2020-08-08 NOTE — Progress Notes (Signed)
[facility] [address] [place]. [place] [address] [phone]       STROKE FOLLOW UP NOTE  [name] Date of Birth:  [birth year] Medical Record Number:  [id number]   Reason for Referral: stroke follow up    SUBJECTIVE:   CHIEF COMPLAINT:  Chief Complaint  Patient presents with  . Follow-up    RM 14 alone Pt is well, no strokes    HPI:   Today, 08/08/2020, Tommy Garcia returns for a prolonged 108-month stroke follow-up after prior visit approximately 4 months ago. He is unaccompanied today. He has since returning home from SNF facility currently residing with his father. Reports residual deficits of mild decreased left hand dexterity and mild gait impairment but overall improving. he has been slowly returning back to prior activities. 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  Denies new stroke/TIA symptoms.  Remains on aspirin 81 mg daily and atorvastatin 10 mg daily for secondary stroke prevention without side effects.  Lipid panel 08/01/2020 showed LDL 64.  Blood pressure today 121/84. No concerns at this time.     History provided for reference purposes only Initial visit 04/06/2020 JM: Tommy Garcia is being seen for hospital follow-up Continues to reside at [facility] and rehabilitation center.  He is unaccompanied at today's visit Patient reports residual deficits of decreased left hand dexterity and coordination as well as gait impairment and imbalance.  His lack of LUE motor control is his biggest concern Continues to work with PT/OT/SLP at facility- he does report improvement of deficits He is currently on a regular diet without difficulty and denies residual speech difficulty Ambulates with rollator walker but hopeful he will be ambulating without AD in the near future Denies new or worsening stroke/TIA symptoms Started on aspirin 81mg  daily for elevated platelet count by facility.  Denies issues with  bleeding or bruising. Blood pressure today 120/72 - stable at facility per patient No further concerns at this time  Stroke admission 02/23/2020 Tommy Garcia is a 40 y.o. male with history of Etoh abuse and HTN  presented on 02/23/2020 to [facility] with L sided weakness, and R gaze preference and delirium tremens.   Transfer to [facility] for further stroke work-up.  Stroke work-up revealed right basal ganglia ICH with midline shift likely secondary to HTN.  Presented in hypertensive emergency treated with Cleviprex and initiated amlodipine and lisinopril at discharge.  LDL 71.  A1c 5.1.  Tobacco use with smoking cessation counseling provided.  Substance abuse with UDS positive for benzos and barbiturates.  EtOH withdrawal with delirium tremens with use of Ativan and placed on Seroquel and thiamine/folate/MVI.  No prior stroke history.  Other active cons include acute alcohol hepatitis with transaminitis, hyponatremia, hypokalemia and hypomagnesemia placed on supplementation.  Residual deficits of left-sided weakness, dysphagia, moderate dysarthria, and cognitive impairment.  Evaluated by therapy and recommended discharge to SNF for ongoing therapy needs.  Stroke: R basal ganglia ICH likely secondary to HTN   CT head R basal ganglia hemorrhage w/ R->L midline shift   CTA head & neck Unremarkable   Repeat CT head stable hemorrhage   CT repeat 7/30 stable hematoma, mildly increased midline shift and mass-effect  2D Echo EF 50-55%. No source of embolus   EEG 7/28 - excessive beta, generalized slowing  LDL 71   HgbA1c 5.1  UDS positive for benzo and barbiturates  VTE prophylaxis - heparin subq   No antithrombotic  prior to admission, now on No antithrombotic given hemorrhage   Therapy recommendations CIR->likely SNF given benefits  Disposition:  SNF        ROS:   14 system review of systems performed and negative with exception of those reported in HPI  PMH:  Past Medical  History:  Diagnosis Date  . Alcohol abuse   . Anxiety 04/28/2020  . Depression   . GERD (gastroesophageal reflux disease)   . History of alcohol abuse 04/28/2020  . Hypertension   . Neuropathy 04/28/2020  . Stroke [facility])     PSH:  Past Surgical History:  Procedure Laterality Date  . HAND TENDON SURGERY Right   . OPEN REDUCTION INTERNAL FIXATION (ORIF) DISTAL RADIAL FRACTURE Left 12/26/2017   Procedure: OPEN REDUCTION INTERNAL FIXATION (ORIF) DISTAL RADIAL FRACTURE,;  Surgeon: Betha Loa, MD;  Location:  SURGERY CENTER;  Service: Orthopedics;  Laterality: Left;  . WRIST SURGERY Left around 1995    Social History:  Social History   Socioeconomic History  . Marital status: Divorced    Spouse name: Not on file  . Number of children: Not on file  . Years of education: 74  . Highest education level: High school graduate  Occupational History  . Not on file  Tobacco Use  . Smoking status: Former Smoker    Packs/day: 1.00    Years: 25.00    Pack years: 25.00    Types: Cigarettes    Quit date: 11/2019    Years since quitting: 0.6  . Smokeless tobacco: Never Used  Vaping Use  . Vaping Use: Never used  Substance and Sexual Activity  . Alcohol use: Not Currently    Alcohol/week: 12.0 standard drinks    Types: 12 Cans of beer per week    Comment: hasn't had any alcohol since July 2021 ---10-18 beers /day  . Drug use: Not Currently    Types: Marijuana  . Sexual activity: Not Currently  Other Topics Concern  . Not on file  Social History Narrative  . Not on file   Social Determinants of Health   Financial Resource Strain: Not on file  Food Insecurity: Not on file  Transportation Needs: Not on file  Physical Activity: Not on file  Stress: Not on file  Social Connections: Not on file  Intimate Partner Violence: Not on file    Family History:  Family History  Problem Relation Age of Onset  . Anxiety disorder Mother   . Depression Mother   . Arthritis  Father   . Stroke Paternal Grandmother   . Cancer Paternal Grandfather        Prostate    Medications:   Current Outpatient Medications on File Prior to Visit  Medication Sig Dispense Refill  . amLODipine (NORVASC) 10 MG tablet Take 1 tablet (10 mg total) by mouth daily. 90 tablet 2  . aspirin EC 81 MG tablet Take 81 mg by mouth daily. Swallow whole.    Marland Kitchen atorvastatin (LIPITOR) 10 MG tablet Take 1 tablet (10 mg total) by mouth daily. 90 tablet 3  . cephALEXin (KEFLEX) 500 MG capsule Take 1 capsule (500 mg total) by mouth 2 (two) times daily for 10 days. 20 capsule 0  . hydrOXYzine (VISTARIL) 25 MG capsule Take 1 capsule (25 mg total) by mouth 3 (three) times daily as needed. 30 capsule 2  . pantoprazole (PROTONIX) 40 MG tablet Take 40 mg by mouth daily.     No current facility-administered medications on file prior to  visit.    Allergies:  No Known Allergies    OBJECTIVE:  Physical Exam  Vitals:   08/08/20 1142  BP: 121/84  Pulse: (!) 58  Weight: 191 lb 9.6 oz (86.9 kg)  Height: 5\' 11"  (1.803 m)   Body mass index is 26.72 kg/m. No exam data present  General: well developed, well nourished,  pleasant middle-age Caucasian male, seated, in no evident distress Head: head normocephalic and atraumatic.   Neck: supple with no carotid or supraclavicular bruits Cardiovascular: regular rate and rhythm, no murmurs Musculoskeletal: no deformity Skin:  no rash/petichiae Vascular:  Normal pulses all extremities   Neurologic Exam Mental Status: Awake and fully alert. Fluent speech and language. Oriented to place and time. Recent and remote memory intact. Attention span, concentration and fund of knowledge appropriate during visit.  Able to follow commands without difficulty.  Mood and affect appropriate.  Cranial Nerves: Fundoscopic exam reveals sharp disc margins. Pupils equal, briskly reactive to light. Extraocular movements full without nystagmus. Visual fields full to  confrontation. Hearing intact. Facial sensation intact.  Mild left lower facial weakness.  Tongue and palate moves normally and symmetrically.  Motor: Normal bulk and tone.  Full strength in all tested extremities except decreased left hand dexterity  Sensory.: intact to touch , pinprick , position and vibratory sensation.  Coordination: Rapid alternating movements normal in all extremities except decreased left hand. Finger-to-nose and heel-to-shin performed accurately bilaterally  Gait and Station: Arises from chair without difficulty. Stance is normal. Gait demonstrates normal stride length and balance without use of assistive device.  Reflexes: 1+ and symmetric. Toes downgoing.         ASSESSMENT: Tommy Garcia is a 40 y.o. year old male presented with left-sided weakness and right gaze preference on 02/23/2020 with stroke work-up revealing right basal ganglia ICH likely secondary to HTN.  Hospital course complicated by hypertensive emergency on arrival, EtOH withdrawal with delirium tremors and acute alcohol hepatitis with transaminitis, tachypnea, hyponatremia, hypokalemia and hypomagnesemia.  Vascular risk factors include EtOH abuse, tobacco use, substance abuse and HTN.  He was discharged to SNF for ongoing therapy needs.     PLAN:  1. R BG ICH, hypertensive:  a. Residual deficit: decreased left hand dexterity and mild gait impairment. Overall slowly improving. Encouraged continued exercises at home - unable to participate in therapies due to lack of insurance b. Repeat CT head resolution of prior ICH c. Continue aspirin 81mg  daily and atorvastatin 10mg  daily for secondary stroke prevention d. Close PCP follow up for aggressive stroke risk factor management  2. HTN: BP goal <130/90.  Controlled on amlodipine per PCP 3. HLD: LDL goal<70.  Recent LDL 64 (08/01/2020) on atorvastatin 10 mg daily per PCP    Closely follows with PCP adequately monitoring and managing stroke risk factors.  Due to lack of insurance, recommend follow up on an as needed basis which patient agrees with   CC:  [facility] provider: Dr. Alvis LemmingsSethi Morgan, Birder Robsoniffany M, FNP    I spent 25 minutes of face-to-face and non-face-to-face time with patient.  This included previsit chart review, lab review, study review, order entry, electronic health record documentation, patient education regarding recent stroke and etiology, residual deficits, importance of managing stroke risk factors and answered all questions to patient satisfaction   Ihor AustinJessica McCue, [facility] HospitalGNP-BC  [facility] [address] [place], [place] [id number]  Phone [phone] Fax [phone] Note: This document was prepared with digital dictation and possible smart phrase technology. Any  transcriptional errors that result from this process are unintentional.

## 2020-08-08 NOTE — Progress Notes (Signed)
I agree with the above plan 

## 2020-08-08 NOTE — Patient Instructions (Signed)
Continue aspirin 81 mg daily  and atorvastatin 10mg  daily  for secondary stroke prevention  Continue to follow up with PCP regarding blood pressure and cholesterol management  Maintain strict control of hypertension with blood pressure goal below 130/90 and cholesterol with LDL cholesterol (bad cholesterol) goal below 70 mg/dL.    Overall stable from stroke standpoint and recommend follow up on an as needed basis     Thank you for coming to see at [facility]. I hope we have been able to provide you high quality care today.  You may receive a patient satisfaction survey over the next few weeks. We would appreciate your feedback and comments so that we may continue to improve ourselves and the health of our patients.

## 2020-11-16 ENCOUNTER — Encounter: Payer: Self-pay | Admitting: *Deleted

## 2021-01-31 ENCOUNTER — Ambulatory Visit: Payer: Self-pay | Admitting: Family Medicine

## 2021-02-01 ENCOUNTER — Other Ambulatory Visit: Payer: Self-pay

## 2021-02-01 ENCOUNTER — Encounter: Payer: Self-pay | Admitting: Family Medicine

## 2021-02-01 ENCOUNTER — Ambulatory Visit (INDEPENDENT_AMBULATORY_CARE_PROVIDER_SITE_OTHER): Payer: Medicaid Other | Admitting: Family Medicine

## 2021-02-01 VITALS — BP 117/78 | HR 72 | Temp 98.1°F | Ht 71.0 in | Wt 185.2 lb

## 2021-02-01 DIAGNOSIS — F419 Anxiety disorder, unspecified: Secondary | ICD-10-CM

## 2021-02-01 DIAGNOSIS — F1011 Alcohol abuse, in remission: Secondary | ICD-10-CM

## 2021-02-01 DIAGNOSIS — Z8679 Personal history of other diseases of the circulatory system: Secondary | ICD-10-CM

## 2021-02-01 DIAGNOSIS — G629 Polyneuropathy, unspecified: Secondary | ICD-10-CM

## 2021-02-01 DIAGNOSIS — I10 Essential (primary) hypertension: Secondary | ICD-10-CM

## 2021-02-01 DIAGNOSIS — F339 Major depressive disorder, recurrent, unspecified: Secondary | ICD-10-CM

## 2021-02-01 LAB — CMP14+EGFR
ALT: 14 IU/L (ref 0–44)
AST: 19 IU/L (ref 0–40)
Albumin/Globulin Ratio: 1.8 (ref 1.2–2.2)
Albumin: 4.8 g/dL (ref 4.0–5.0)
Alkaline Phosphatase: 102 IU/L (ref 44–121)
BUN/Creatinine Ratio: 18 (ref 9–20)
BUN: 16 mg/dL (ref 6–24)
Bilirubin Total: 0.5 mg/dL (ref 0.0–1.2)
CO2: 22 mmol/L (ref 20–29)
Calcium: 9.8 mg/dL (ref 8.7–10.2)
Chloride: 101 mmol/L (ref 96–106)
Creatinine, Ser: 0.88 mg/dL (ref 0.76–1.27)
Globulin, Total: 2.6 g/dL (ref 1.5–4.5)
Glucose: 89 mg/dL (ref 65–99)
Potassium: 4.5 mmol/L (ref 3.5–5.2)
Sodium: 137 mmol/L (ref 134–144)
Total Protein: 7.4 g/dL (ref 6.0–8.5)
eGFR: 111 mL/min/{1.73_m2} (ref >59)

## 2021-02-01 LAB — LIPID PANEL
Chol/HDL Ratio: 5.5 ratio — ABNORMAL HIGH (ref 0.0–5.0)
Cholesterol, Total: 169 mg/dL (ref 100–199)
HDL: 31 mg/dL — ABNORMAL LOW (ref >39)
LDL Chol Calc (NIH): 119 mg/dL — ABNORMAL HIGH (ref 0–99)
Triglycerides: 102 mg/dL (ref 0–149)
VLDL Cholesterol Cal: 19 mg/dL (ref 5–40)

## 2021-02-01 MED ORDER — GABAPENTIN 300 MG PO CAPS: 300.0000 mg | ORAL_CAPSULE | Freq: Three times a day (TID) | ORAL | 3 refills | Status: DC

## 2021-02-01 NOTE — Patient Instructions (Signed)

## 2021-02-01 NOTE — Progress Notes (Signed)
Established Patient Office Visit  Subjective:  Patient ID: Tommy Garcia, male    DOB: Jan 28, 1981  Age: 40 y.o. MRN: [id number]  CC:  Chief Complaint  Patient presents with   Medical Management of Chronic Issues    HPI DAUNTAE DERUSHA presents for chronic follow up.   HTN Complaint with meds - Yes Checking BP at home ranging: no Exercising Regularly - occasional Watching Salt intake - Yes Pertinent ROS: Headache - No Chest pain - No Dyspnea - No Palpitations - No LE edema - No  He has not been taking his atorvastatin due to cost. Denies focal weakness.  2. Neuropathy Continue to have lingering neuropathy in his left leg. Previously did PT.   3. History of alcohol abuse Reports remains sober. Attending [place] meetings.  4. Anxiety and depression Has not been taking Vistaril due to costs. Has declined SSRI therapy.   GAD 7 : Generalized Anxiety Score 02/01/2021 08/01/2020 04/28/2020  Nervous, Anxious, on Edge $Remov'1 1 3  'wBUTCh$ Control/stop worrying 1 0 3  Worry too much - different things $RemoveBeforeDE'1 1 3  'YLHxcypeceSETwH$ Trouble relaxing $RemoveBeforeDE'1 1 3  'dhvIYYOzBPTgOZG$ Restless $RemoveBeforeD'1 1 3  'rsaEDwRKTeiyjU$ Easily annoyed or irritable 0 0 2  Afraid - awful might happen 1 0 1  Total GAD 7 Score $Remov'6 4 18  'ZngTlx$ Anxiety Difficulty Not difficult at all - -    Depression screen [facility] 2/9 02/01/2021 08/01/2020 04/28/2020  Decreased Interest 1 0 3  Down, Depressed, Hopeless 0 0 0  PHQ - 2 Score 1 0 3  Altered sleeping 2 - 3  Tired, decreased energy 0 - 0  Change in appetite 0 - 0  Feeling bad or failure about yourself  0 - 0  Trouble concentrating 0 - 0  Moving slowly or fidgety/restless 0 - 0  Suicidal thoughts 0 - 0  PHQ-9 Score 3 - 6  Difficult doing work/chores Not difficult at all - Somewhat difficult     Past Medical History:  Diagnosis Date   Alcohol abuse    Anxiety 04/28/2020   Depression    GERD (gastroesophageal reflux disease)    History of alcohol abuse 04/28/2020   Hypertension    Neuropathy 04/28/2020   Stroke [facility])     Past Surgical History:   Procedure Laterality Date   HAND TENDON SURGERY Right    OPEN REDUCTION INTERNAL FIXATION (ORIF) DISTAL RADIAL FRACTURE Left 12/26/2017   Procedure: OPEN REDUCTION INTERNAL FIXATION (ORIF) DISTAL RADIAL FRACTURE,;  Surgeon: Leanora Cover, MD;  Location: [facility];  Service: Orthopedics;  Laterality: Left;   WRIST SURGERY Left around 1995    Family History  Problem Relation Age of Onset   Anxiety disorder Mother    Depression Mother    Arthritis Father    Stroke Paternal Grandmother    Cancer Paternal Grandfather        Prostate    Social History   Socioeconomic History   Marital status: Divorced    Spouse name: Not on file   Number of children: Not on file   Years of education: 12   Highest education level: High school graduate  Occupational History   Not on file  Tobacco Use   Smoking status: Former    Packs/day: 1.00    Years: 25.00    Pack years: 25.00    Types: Cigarettes    Quit date: 11/2019    Years since quitting: 1.1   Smokeless tobacco: Never  Vaping Use   Vaping Use:  Never used  Substance and Sexual Activity   Alcohol use: Not Currently    Alcohol/week: 12.0 standard drinks    Types: 12 Cans of beer per week    Comment: hasn't had any alcohol since July 2021 ---10-18 beers /day   Drug use: Not Currently    Types: Marijuana   Sexual activity: Not Currently  Other Topics Concern   Not on file  Social History Narrative   Not on file   Social Determinants of Health   Financial Resource Strain: Not on file  Food Insecurity: Not on file  Transportation Needs: Not on file  Physical Activity: Not on file  Stress: Not on file  Social Connections: Not on file  Intimate Partner Violence: Not on file    Outpatient Medications Prior to Visit  Medication Sig Dispense Refill   amLODipine (NORVASC) 10 MG tablet Take 1 tablet (10 mg total) by mouth daily. 90 tablet 2   aspirin EC 81 MG tablet Take 81 mg by mouth daily. Swallow whole.      atorvastatin (LIPITOR) 10 MG tablet Take 1 tablet (10 mg total) by mouth daily. (Patient not taking: Reported on 02/01/2021) 90 tablet 3   hydrOXYzine (VISTARIL) 25 MG capsule Take 1 capsule (25 mg total) by mouth 3 (three) times daily as needed. (Patient not taking: Reported on 02/01/2021) 30 capsule 2   pantoprazole (PROTONIX) 40 MG tablet Take 40 mg by mouth daily. (Patient not taking: Reported on 02/01/2021)     No facility-administered medications prior to visit.    No Known Allergies  ROS Review of Systems As per HPI.    Objective:    Physical Exam Vitals and nursing note reviewed.  Constitutional:      General: He is not in acute distress.    Appearance: He is not ill-appearing, toxic-appearing or diaphoretic.  Eyes:     Extraocular Movements: Extraocular movements intact.     Pupils: Pupils are equal, round, and reactive to light.  Cardiovascular:     Rate and Rhythm: Normal rate and regular rhythm.     Heart sounds: Normal heart sounds. No murmur heard. Pulmonary:     Effort: Pulmonary effort is normal. No respiratory distress.     Breath sounds: Normal breath sounds.  Musculoskeletal:     Right lower leg: No edema.     Left lower leg: No edema.  Skin:    General: Skin is warm and dry.  Neurological:     General: No focal deficit present.     Mental Status: He is alert and oriented to person, place, and time.     Motor: No weakness.     Gait: Gait normal.  Psychiatric:        Mood and Affect: Mood normal.        Behavior: Behavior normal.    BP 117/78   Pulse 72   Temp 98.1 F (36.7 C) (Oral)   Ht 5\' 11"  (1.803 m)   Wt 185 lb 4 oz (84 kg)   BMI 25.84 kg/m  Wt Readings from Last 3 Encounters:  02/01/21 185 lb 4 oz (84 kg)  08/08/20 191 lb 9.6 oz (86.9 kg)  08/01/20 190 lb (86.2 kg)     Health Maintenance Due  Topic Date Due   COVID-19 Vaccine (2 - Moderna series) 05/03/2020    There are no preventive care reminders to display for this patient.  Lab  Results  Component Value Date   TSH 0.997 08/01/2020  Lab Results  Component Value Date   WBC 9.4 08/01/2020   HGB 14.1 08/01/2020   HCT 40.5 08/01/2020   MCV 88 08/01/2020   PLT 368 08/01/2020   Lab Results  Component Value Date   NA 139 08/01/2020   K 4.1 08/01/2020   CO2 23 08/01/2020   GLUCOSE 81 08/01/2020   BUN 14 08/01/2020   CREATININE 0.88 08/01/2020   BILITOT 0.6 08/01/2020   ALKPHOS 118 08/01/2020   AST 14 08/01/2020   ALT 11 08/01/2020   PROT 7.3 08/01/2020   ALBUMIN 4.4 08/01/2020   CALCIUM 9.6 08/01/2020   ANIONGAP 12 03/09/2020   Lab Results  Component Value Date   CHOL 115 08/01/2020   Lab Results  Component Value Date   HDL 27 (L) 08/01/2020   Lab Results  Component Value Date   LDLCALC 64 08/01/2020   Lab Results  Component Value Date   TRIG 131 08/01/2020   Lab Results  Component Value Date   CHOLHDL 4.3 08/01/2020   Lab Results  Component Value Date   HGBA1C 5.1 02/25/2020      Assessment & Plan:   Samuele was seen today for medical management of chronic issues.  Diagnoses and all orders for this visit:  Primary hypertension Well controlled on current regimen. Labs pending. -     CMP14+EGFR -     Lipid panel  History of cerebral hemorrhage Patient stopped atorvastatin due to cost. Labs pending.  -     CMP14+EGFR -     Lipid panel  History of alcohol abuse Remains sober.   Neuropathy Gabapentin ordered.  -     gabapentin (NEURONTIN) 300 MG capsule; Take 1 capsule (300 mg total) by mouth 3 (three) times daily.  Recurrent depression ([place]) Declined therapy. PHQ score of 3 today.  Anxiety GAD score of 6 today. Has not been taking vistaril due to cost.      Follow-up: Return in about 6 months (around 08/04/2021) for chronic follow up.   The patient indicates understanding of these issues and agrees with the plan.   Gwenlyn Perking, FNP

## 2021-02-03 ENCOUNTER — Other Ambulatory Visit: Payer: Self-pay | Admitting: Family Medicine

## 2021-02-03 DIAGNOSIS — I10 Essential (primary) hypertension: Secondary | ICD-10-CM

## 2021-02-03 DIAGNOSIS — I610 Nontraumatic intracerebral hemorrhage in hemisphere, subcortical: Secondary | ICD-10-CM

## 2021-02-03 MED ORDER — ATORVASTATIN CALCIUM 10 MG PO TABS: 10.0000 mg | ORAL_TABLET | Freq: Every day | ORAL | 3 refills | Status: AC

## 2021-05-18 ENCOUNTER — Other Ambulatory Visit: Payer: Self-pay | Admitting: Family Medicine

## 2021-05-18 DIAGNOSIS — I10 Essential (primary) hypertension: Secondary | ICD-10-CM

## 2021-06-01 ENCOUNTER — Encounter: Payer: Self-pay | Admitting: *Deleted

## 2021-06-27 ENCOUNTER — Other Ambulatory Visit: Payer: Self-pay | Admitting: Family Medicine

## 2021-06-27 DIAGNOSIS — G629 Polyneuropathy, unspecified: Secondary | ICD-10-CM

## 2021-08-04 ENCOUNTER — Ambulatory Visit: Payer: Medicaid Other | Admitting: Family Medicine

## 2021-08-08 ENCOUNTER — Ambulatory Visit: Payer: Medicaid Other | Admitting: Family Medicine

## 2021-08-12 ENCOUNTER — Other Ambulatory Visit: Payer: Self-pay | Admitting: Family Medicine

## 2021-08-12 DIAGNOSIS — G629 Polyneuropathy, unspecified: Secondary | ICD-10-CM

## 2021-08-12 DIAGNOSIS — I10 Essential (primary) hypertension: Secondary | ICD-10-CM

## 2021-08-14 ENCOUNTER — Encounter: Payer: Self-pay | Admitting: Family Medicine

## 2021-08-14 NOTE — Telephone Encounter (Signed)
Patient must schedule a follow up appointment before we can refill medication

## 2021-08-14 NOTE — Telephone Encounter (Signed)
Called to schedule appt. No answer and voicemail full. Letter sent.  °

## 2021-08-17 ENCOUNTER — Other Ambulatory Visit: Payer: Self-pay | Admitting: *Deleted

## 2021-08-17 ENCOUNTER — Ambulatory Visit: Payer: Medicaid Other | Admitting: Family Medicine

## 2021-08-17 DIAGNOSIS — I10 Essential (primary) hypertension: Secondary | ICD-10-CM

## 2021-08-17 DIAGNOSIS — G629 Polyneuropathy, unspecified: Secondary | ICD-10-CM

## 2021-08-17 MED ORDER — AMLODIPINE BESYLATE 10 MG PO TABS: 10.0000 mg | ORAL_TABLET | Freq: Every day | ORAL | 0 refills | Status: AC

## 2021-08-17 MED ORDER — GABAPENTIN 300 MG PO CAPS: 300.0000 mg | ORAL_CAPSULE | Freq: Three times a day (TID) | ORAL | 0 refills | Status: AC

## 2021-08-17 NOTE — Telephone Encounter (Signed)
Thirty day supply given for patient to last until he can be seen at new facility since we no longer accept his insurance.

## 2023-02-24 ENCOUNTER — Other Ambulatory Visit: Payer: Self-pay

## 2023-02-24 ENCOUNTER — Emergency Department (HOSPITAL_BASED_OUTPATIENT_CLINIC_OR_DEPARTMENT_OTHER): Payer: Medicaid Other

## 2023-02-24 ENCOUNTER — Encounter (HOSPITAL_BASED_OUTPATIENT_CLINIC_OR_DEPARTMENT_OTHER): Payer: Self-pay

## 2023-02-24 ENCOUNTER — Emergency Department (HOSPITAL_BASED_OUTPATIENT_CLINIC_OR_DEPARTMENT_OTHER)
Admission: EM | Admit: 2023-02-24 | Discharge: 2023-02-24 | Disposition: A | Discharge status: Home / Self Care | Payer: Medicaid Other | Attending: Emergency Medicine | Admitting: Emergency Medicine

## 2023-02-24 DIAGNOSIS — M79675 Pain in left toe(s): Secondary | ICD-10-CM

## 2023-02-24 DIAGNOSIS — I10 Essential (primary) hypertension: Secondary | ICD-10-CM | POA: Diagnosis not present

## 2023-02-24 DIAGNOSIS — Z79899 Other long term (current) drug therapy: Secondary | ICD-10-CM | POA: Insufficient documentation

## 2023-02-24 DIAGNOSIS — L03032 Cellulitis of left toe: Secondary | ICD-10-CM | POA: Diagnosis not present

## 2023-02-24 DIAGNOSIS — Z7982 Long term (current) use of aspirin: Secondary | ICD-10-CM | POA: Insufficient documentation

## 2023-02-24 MED ORDER — DOXYCYCLINE HYCLATE 100 MG PO CAPS
100.0000 mg | ORAL_CAPSULE | Freq: Two times a day (BID) | ORAL | 0 refills | Status: AC
Start: 2023-02-24 — End: ?

## 2023-02-24 NOTE — Discharge Instructions (Addendum)
Please take your antibiotics as prescribed. Take tylenol/ibuprofen for pain. I recommend close follow-up with your PCP or podiatry for reevaluation.  Please do not hesitate to return to emergency department if worrisome signs symptoms we discussed become apparent.

## 2023-02-24 NOTE — ED Provider Notes (Signed)
[facility] Provider Note   CSN: [id number] Arrival date & time: 02/24/23  1626     History  Chief Complaint  Patient presents with   Wound Infection    Tommy Garcia is a 42 y.o. male presents today for evaluation of great toe pain.  Patient reports that he has noticed some yellow drainage from the top of the left great toe for 3 days.  States he does not feel much pain due to neuropathy, stroke.  Denies any fever, nausea, vomiting.  HPI    Past Medical History:  Diagnosis Date   Alcohol abuse    Anxiety 04/28/2020   Depression    GERD (gastroesophageal reflux disease)    History of alcohol abuse 04/28/2020   Hypertension    Neuropathy 04/28/2020   Stroke [facility])    Past Surgical History:  Procedure Laterality Date   HAND TENDON SURGERY Right    OPEN REDUCTION INTERNAL FIXATION (ORIF) DISTAL RADIAL FRACTURE Left 12/26/2017   Procedure: OPEN REDUCTION INTERNAL FIXATION (ORIF) DISTAL RADIAL FRACTURE,;  Surgeon: Betha Loa, MD;  Location: [facility];  Service: Orthopedics;  Laterality: Left;   WRIST SURGERY Left around 1995     Home Medications Prior to Admission medications   Medication Sig Start Date End Date Taking? Authorizing Provider  doxycycline (VIBRAMYCIN) 100 MG capsule Take 1 capsule (100 mg total) by mouth 2 (two) times daily. 02/24/23  Yes Jeanelle Malling, PA  amLODipine (NORVASC) 10 MG tablet Take 1 tablet (10 mg total) by mouth daily. 08/17/21   Gwenlyn Fudge, FNP  aspirin EC 81 MG tablet Take 81 mg by mouth daily. Swallow whole.    [provider]  atorvastatin (LIPITOR) 10 MG tablet Take 1 tablet (10 mg total) by mouth daily. 02/03/21   Gabriel Earing, FNP  gabapentin (NEURONTIN) 300 MG capsule Take 1 capsule (300 mg total) by mouth 3 (three) times daily. 08/17/21   Gwenlyn Fudge, FNP  hydrOXYzine (VISTARIL) 25 MG capsule Take 1 capsule (25 mg total) by mouth 3 (three) times daily as  needed. Patient not taking: Reported on 02/01/2021 04/28/20   Gabriel Earing, FNP  pantoprazole (PROTONIX) 40 MG tablet Take 40 mg by mouth daily. Patient not taking: Reported on 02/01/2021 04/21/20   [provider]      Allergies    Patient has no known allergies.    Review of Systems   Review of Systems Negative except as per HPI.  Physical Exam Updated Vital Signs BP (!) 120/91   Pulse 71   Temp 97.7 F (36.5 C) (Oral)   Resp 18   Ht 5\' 11"  (1.803 m)   Wt 79.4 kg   SpO2 99%   BMI 24.41 kg/m  Physical Exam Vitals and nursing note reviewed.  Constitutional:      Appearance: Normal appearance.  HENT:     Head: Normocephalic and atraumatic.     Mouth/Throat:     Mouth: Mucous membranes are moist.  Eyes:     General: No scleral icterus. Cardiovascular:     Rate and Rhythm: Normal rate and regular rhythm.     Pulses: Normal pulses.     Heart sounds: Normal heart sounds.  Pulmonary:     Effort: Pulmonary effort is normal.     Breath sounds: Normal breath sounds.  Abdominal:     General: Abdomen is flat.     Palpations: Abdomen is soft.  Tenderness: There is no abdominal tenderness.  Musculoskeletal:        General: No deformity.  Feet:     Comments: Area of infection noted to the Left great toe. There is an opening area with pus drainage. Skin:    General: Skin is warm.     Findings: No rash.  Neurological:     General: No focal deficit present.     Mental Status: He is alert.  Psychiatric:        Mood and Affect: Mood normal.     ED Results / Procedures / Treatments   Labs (all labs ordered are listed, but only abnormal results are displayed) Labs Reviewed - No data to display  EKG None  Radiology DG Toe Great Left  Result Date: 02/24/2023 CLINICAL DATA:  Left great toe pain, drainage EXAM: LEFT GREAT TOE COMPARISON:  None Available. FINDINGS: Frontal, oblique, and lateral views of the left great toe are obtained. There is marked soft  tissue swelling. No subcutaneous gas or radiopaque foreign body. There are no acute or destructive bony abnormalities. Alignment is anatomic. Joint spaces are well preserved. IMPRESSION: 1. No acute or destructive bony abnormalities. 2. Marked soft tissue swelling. No subcutaneous gas or radiopaque foreign body. Electronically Signed   By: Sharlet Salina M.D.   On: 02/24/2023 19:19    Procedures Procedures    Medications Ordered in ED Medications - No data to display  ED Course/ Medical Decision Making/ A&P                             Medical Decision Making Amount and/or Complexity of Data Reviewed Radiology: ordered.  Risk Prescription drug management.   This patient presents to the ED for toe pain, this involves an extensive number of treatment options, and is a complaint that carries with a high risk of complications and morbidity.  The differential diagnosis includes paronychia, fracture, dislocation, osteomyelitis.  This is not an exhaustive list.  Imaging studies: I ordered imaging studies, personally reviewed, interpreted imaging and agree with the radiologist's interpretations. The results include: X-ray of the left great toe show marked soft tissue swelling with no subcutaneous gas or radiopaque foreign body.  Problem list/ ED course/ Critical interventions/ Medical management: HPI: See above Vital signs within normal range and stable throughout visit. Laboratory/imaging studies significant for: See above. On physical examination, patient is afebrile and appears in no acute distress.  There is an area of infection to the top of the left great toe with pus drainage noted.  Likely paronychia of the right toe.  No abscess appreciated for I&D.  X-ray of the great left toe showed marked soft tissue swelling with no subcutaneous gas or radiopaque foreign body.  I will send an Rx of doxycycline for cellulitis with pus drainage.  Advised patient to take the antibiotic as prescribed,  Tylenol ibuprofen for pain and follow-up with PCP or podiatry for reevaluation.  Strict return precaution discussed. I have reviewed the patient home medicines and have made adjustments as needed.  Cardiac monitoring/EKG: The patient was maintained on a cardiac monitor.  I personally reviewed and interpreted the cardiac monitor which showed an underlying rhythm of: sinus rhythm.  Additional history obtained: External records from outside source obtained and reviewed including: Chart review including previous notes, labs, imaging.  Consultations obtained:  Disposition Continued outpatient therapy. Follow-up with PCP or podiatry recommended for reevaluation of symptoms. Treatment plan discussed with patient.  Pt acknowledged understanding was agreeable to the plan. Worrisome signs and symptoms were discussed with patient, and patient acknowledged understanding to return to the ED if they noticed these signs and symptoms. Patient was stable upon discharge.   This chart was dictated using voice recognition software.  Despite best efforts to proofread,  errors can occur which can change the documentation meaning.          Final Clinical Impression(s) / ED Diagnoses Final diagnoses:  Pain of left great toe  Cellulitis of toe of left foot    Rx / DC Orders ED Discharge Orders          Ordered    doxycycline (VIBRAMYCIN) 100 MG capsule  2 times daily        02/24/23 1938              Jeanelle Malling, PA 02/24/23 1950    Ernie Avena, MD 02/24/23 Corky Crafts

## 2023-02-24 NOTE — ED Triage Notes (Signed)
Patient has a wound to his left great toe that has yellow drainage since Friday. He denied fever.
